# Patient Record
Sex: Female | Born: 1940 | Race: Asian | Hispanic: No | Smoking: Never smoker
Health system: Southern US, Community
[De-identification: ages and names within clinical notes are randomized; demographics above are authoritative.]

## PROBLEM LIST (undated history)

## (undated) DIAGNOSIS — M7710 Lateral epicondylitis, unspecified elbow: Secondary | ICD-10-CM

## (undated) DIAGNOSIS — E785 Hyperlipidemia, unspecified: Secondary | ICD-10-CM

## (undated) DIAGNOSIS — K219 Gastro-esophageal reflux disease without esophagitis: Secondary | ICD-10-CM

## (undated) DIAGNOSIS — I10 Essential (primary) hypertension: Secondary | ICD-10-CM

## (undated) DIAGNOSIS — F3289 Other specified depressive episodes: Secondary | ICD-10-CM

## (undated) DIAGNOSIS — F329 Major depressive disorder, single episode, unspecified: Secondary | ICD-10-CM

## (undated) DIAGNOSIS — K7689 Other specified diseases of liver: Secondary | ICD-10-CM

## (undated) DIAGNOSIS — K3184 Gastroparesis: Secondary | ICD-10-CM

## (undated) DIAGNOSIS — F411 Generalized anxiety disorder: Secondary | ICD-10-CM

## (undated) HISTORY — DX: Lateral epicondylitis, unspecified elbow: M77.10

## (undated) HISTORY — PX: CATARACT EXTRACTION, BILATERAL: SHX1313

## (undated) HISTORY — DX: Other specified diseases of liver: K76.89

## (undated) HISTORY — DX: Major depressive disorder, single episode, unspecified: F32.9

## (undated) HISTORY — DX: Essential (primary) hypertension: I10

## (undated) HISTORY — DX: Gastroparesis: K31.84

## (undated) HISTORY — DX: Gastro-esophageal reflux disease without esophagitis: K21.9

## (undated) HISTORY — DX: Generalized anxiety disorder: F41.1

## (undated) HISTORY — DX: Other specified depressive episodes: F32.89

## (undated) HISTORY — DX: Hyperlipidemia, unspecified: E78.5

---

## 2004-07-17 ENCOUNTER — Ambulatory Visit: Payer: Self-pay | Admitting: Internal Medicine

## 2004-07-18 ENCOUNTER — Ambulatory Visit: Payer: Self-pay | Admitting: Internal Medicine

## 2004-07-29 ENCOUNTER — Ambulatory Visit: Payer: Self-pay | Admitting: Internal Medicine

## 2004-08-04 ENCOUNTER — Ambulatory Visit: Payer: Self-pay | Admitting: Internal Medicine

## 2004-08-14 ENCOUNTER — Emergency Department (HOSPITAL_COMMUNITY): Admission: EM | Admit: 2004-08-14 | Discharge: 2004-08-14 | Payer: Self-pay | Admitting: *Deleted

## 2004-09-01 ENCOUNTER — Encounter: Admission: RE | Admit: 2004-09-01 | Discharge: 2004-11-30 | Payer: Self-pay | Admitting: Internal Medicine

## 2004-10-20 ENCOUNTER — Ambulatory Visit: Payer: Self-pay | Admitting: Internal Medicine

## 2004-10-27 ENCOUNTER — Ambulatory Visit: Payer: Self-pay | Admitting: Internal Medicine

## 2006-08-19 ENCOUNTER — Ambulatory Visit: Payer: Self-pay | Admitting: Internal Medicine

## 2006-08-19 LAB — CONVERTED CEMR LAB
AST: 32 units/L (ref 0–37)
Albumin: 3.9 g/dL (ref 3.5–5.2)
BUN: 13 mg/dL (ref 6–23)
Basophils Relative: 1.3 % — ABNORMAL HIGH (ref 0.0–1.0)
Bilirubin, Direct: 0.2 mg/dL (ref 0.0–0.3)
CO2: 30 meq/L (ref 19–32)
Eosinophils Absolute: 0 10*3/uL (ref 0.0–0.6)
Eosinophils Relative: 0.7 % (ref 0.0–5.0)
GFR calc Af Amer: 93 mL/min
GFR calc non Af Amer: 77 mL/min
HDL: 44.2 mg/dL (ref >39.0)
Hemoglobin, Urine: NEGATIVE
Hemoglobin: 10.5 g/dL — ABNORMAL LOW (ref 12.0–15.0)
Ketones, ur: NEGATIVE mg/dL
MCHC: 34.3 g/dL (ref 30.0–36.0)
MCV: 85 fL (ref 78.0–100.0)
Monocytes Relative: 5.4 % (ref 3.0–11.0)
Neutro Abs: 3.1 10*3/uL (ref 1.4–7.7)
Neutrophils Relative %: 60.4 % (ref 43.0–77.0)
Nitrite: NEGATIVE
Potassium: 4.6 meq/L (ref 3.5–5.1)
Triglycerides: 266 mg/dL (ref 0–149)
Urine Glucose: NEGATIVE mg/dL

## 2006-08-24 ENCOUNTER — Ambulatory Visit: Payer: Self-pay | Admitting: Internal Medicine

## 2006-10-11 ENCOUNTER — Encounter: Payer: Self-pay | Admitting: Internal Medicine

## 2006-10-11 LAB — CONVERTED CEMR LAB

## 2006-11-12 ENCOUNTER — Emergency Department (HOSPITAL_COMMUNITY): Admission: EM | Admit: 2006-11-12 | Discharge: 2006-11-12 | Payer: Self-pay | Admitting: Emergency Medicine

## 2006-11-16 ENCOUNTER — Ambulatory Visit: Payer: Self-pay | Admitting: Internal Medicine

## 2006-11-19 ENCOUNTER — Ambulatory Visit (HOSPITAL_COMMUNITY): Admission: RE | Admit: 2006-11-19 | Discharge: 2006-11-19 | Payer: Self-pay | Admitting: Internal Medicine

## 2006-12-21 ENCOUNTER — Ambulatory Visit: Payer: Self-pay | Admitting: Psychiatry

## 2006-12-31 ENCOUNTER — Ambulatory Visit: Payer: Self-pay | Admitting: Internal Medicine

## 2007-02-07 ENCOUNTER — Ambulatory Visit: Payer: Self-pay | Admitting: Internal Medicine

## 2007-02-07 LAB — CONVERTED CEMR LAB
ALT: 34 units/L (ref 0–35)
AST: 25 units/L (ref 0–37)
BUN: 12 mg/dL (ref 6–23)
CO2: 30 meq/L (ref 19–32)
Calcium: 9.5 mg/dL (ref 8.4–10.5)
Chloride: 108 meq/L (ref 96–112)
Creatinine, Ser: 0.8 mg/dL (ref 0.4–1.2)
Creatinine,U: 59 mg/dL
Hgb A1c MFr Bld: 6.5 % — ABNORMAL HIGH (ref 4.6–6.0)
Microalb Creat Ratio: 10.2 mg/g (ref 0.0–30.0)
Microalb, Ur: 0.6 mg/dL (ref 0.0–1.9)
Potassium: 4 meq/L (ref 3.5–5.1)
TSH: 0.68 microintl units/mL (ref 0.35–5.50)

## 2007-02-11 ENCOUNTER — Ambulatory Visit: Payer: Self-pay | Admitting: Internal Medicine

## 2007-02-22 ENCOUNTER — Ambulatory Visit: Payer: Self-pay | Admitting: Internal Medicine

## 2007-02-28 ENCOUNTER — Ambulatory Visit: Payer: Self-pay | Admitting: Internal Medicine

## 2007-03-09 ENCOUNTER — Inpatient Hospital Stay (HOSPITAL_COMMUNITY): Admission: EM | Admit: 2007-03-09 | Discharge: 2007-03-11 | Payer: Self-pay | Admitting: Emergency Medicine

## 2007-03-09 ENCOUNTER — Ambulatory Visit: Payer: Self-pay | Admitting: Internal Medicine

## 2007-03-16 ENCOUNTER — Ambulatory Visit: Payer: Self-pay | Admitting: Internal Medicine

## 2007-03-21 ENCOUNTER — Ambulatory Visit: Payer: Self-pay | Admitting: Internal Medicine

## 2007-03-28 ENCOUNTER — Encounter: Admission: RE | Admit: 2007-03-28 | Discharge: 2007-04-13 | Payer: Self-pay | Admitting: Internal Medicine

## 2007-04-14 ENCOUNTER — Ambulatory Visit: Payer: Self-pay | Admitting: Internal Medicine

## 2007-05-09 ENCOUNTER — Ambulatory Visit: Payer: Self-pay | Admitting: Internal Medicine

## 2007-05-09 DIAGNOSIS — I10 Essential (primary) hypertension: Secondary | ICD-10-CM | POA: Insufficient documentation

## 2007-05-09 DIAGNOSIS — K219 Gastro-esophageal reflux disease without esophagitis: Secondary | ICD-10-CM | POA: Insufficient documentation

## 2007-05-09 DIAGNOSIS — F32A Depression, unspecified: Secondary | ICD-10-CM | POA: Insufficient documentation

## 2007-05-09 DIAGNOSIS — IMO0001 Reserved for inherently not codable concepts without codable children: Secondary | ICD-10-CM | POA: Insufficient documentation

## 2007-05-09 DIAGNOSIS — F329 Major depressive disorder, single episode, unspecified: Secondary | ICD-10-CM | POA: Insufficient documentation

## 2007-05-09 DIAGNOSIS — E119 Type 2 diabetes mellitus without complications: Secondary | ICD-10-CM | POA: Insufficient documentation

## 2007-05-09 HISTORY — DX: Essential (primary) hypertension: I10

## 2007-05-12 ENCOUNTER — Emergency Department (HOSPITAL_COMMUNITY): Admission: EM | Admit: 2007-05-12 | Discharge: 2007-05-12 | Payer: Self-pay | Admitting: Emergency Medicine

## 2007-05-26 ENCOUNTER — Encounter: Payer: Self-pay | Admitting: Internal Medicine

## 2007-07-05 ENCOUNTER — Telehealth: Payer: Self-pay | Admitting: Internal Medicine

## 2007-07-05 ENCOUNTER — Ambulatory Visit: Payer: Self-pay | Admitting: Internal Medicine

## 2007-07-05 DIAGNOSIS — E785 Hyperlipidemia, unspecified: Secondary | ICD-10-CM | POA: Insufficient documentation

## 2007-07-05 DIAGNOSIS — R5381 Other malaise: Secondary | ICD-10-CM

## 2007-07-05 DIAGNOSIS — R5383 Other fatigue: Secondary | ICD-10-CM

## 2007-07-05 LAB — CONVERTED CEMR LAB
ALT: 55 units/L — ABNORMAL HIGH (ref 0–35)
AST: 32 units/L (ref 0–37)
Albumin: 4.4 g/dL (ref 3.5–5.2)
Alkaline Phosphatase: 68 units/L (ref 39–117)
BUN: 11 mg/dL (ref 6–23)
Basophils Absolute: 0 10*3/uL (ref 0.0–0.1)
Bilirubin, Direct: 0.2 mg/dL (ref 0.0–0.3)
CO2: 30 meq/L (ref 19–32)
Calcium: 9.6 mg/dL (ref 8.4–10.5)
Cholesterol: 259 mg/dL (ref 0–200)
Creatinine, Ser: 0.8 mg/dL (ref 0.4–1.2)
GFR calc Af Amer: 93 mL/min
HCT: 38.1 % (ref 36.0–46.0)
Hemoglobin: 13.2 g/dL (ref 12.0–15.0)
Hgb A1c MFr Bld: 7.3 % — ABNORMAL HIGH (ref 4.6–6.0)
Lymphocytes Relative: 27.4 % (ref 12.0–46.0)
MCHC: 34.6 g/dL (ref 30.0–36.0)
Monocytes Relative: 5.9 % (ref 3.0–11.0)
Potassium: 4.1 meq/L (ref 3.5–5.1)
RBC: 4.47 M/uL (ref 3.87–5.11)

## 2007-07-19 ENCOUNTER — Ambulatory Visit: Payer: Self-pay | Admitting: Internal Medicine

## 2007-07-19 DIAGNOSIS — R945 Abnormal results of liver function studies: Secondary | ICD-10-CM

## 2007-08-15 ENCOUNTER — Ambulatory Visit: Payer: Self-pay | Admitting: Internal Medicine

## 2007-08-15 LAB — CONVERTED CEMR LAB
Hemoglobin, Urine: NEGATIVE
Ketones, ur: NEGATIVE mg/dL
Leukocytes, UA: NEGATIVE
Urine Glucose: 250 mg/dL — AB
Urobilinogen, UA: 0.2 (ref 0.0–1.0)

## 2007-09-01 DIAGNOSIS — R42 Dizziness and giddiness: Secondary | ICD-10-CM

## 2007-09-01 DIAGNOSIS — K7689 Other specified diseases of liver: Secondary | ICD-10-CM | POA: Insufficient documentation

## 2007-09-01 DIAGNOSIS — K3184 Gastroparesis: Secondary | ICD-10-CM | POA: Insufficient documentation

## 2007-09-05 ENCOUNTER — Ambulatory Visit: Payer: Self-pay | Admitting: Internal Medicine

## 2007-10-21 ENCOUNTER — Encounter: Payer: Self-pay | Admitting: Internal Medicine

## 2007-10-25 ENCOUNTER — Ambulatory Visit: Payer: Self-pay | Admitting: Internal Medicine

## 2007-10-25 LAB — CONVERTED CEMR LAB
Alkaline Phosphatase: 52 units/L (ref 39–117)
Bilirubin Urine: NEGATIVE
Bilirubin, Direct: 0.2 mg/dL (ref 0.0–0.3)
Calcium: 9 mg/dL (ref 8.4–10.5)
Cholesterol: 175 mg/dL (ref 0–200)
Creatinine, Ser: 0.8 mg/dL (ref 0.4–1.2)
GFR calc Af Amer: 93 mL/min
GFR calc non Af Amer: 77 mL/min
HDL: 53.4 mg/dL (ref >39.0)
Hgb A1c MFr Bld: 7.5 % — ABNORMAL HIGH (ref 4.6–6.0)
Leukocytes, UA: NEGATIVE
Microalb Creat Ratio: 32.3 mg/g — ABNORMAL HIGH (ref 0.0–30.0)
Microalb, Ur: 1.5 mg/dL (ref 0.0–1.9)
Specific Gravity, Urine: 1.01 (ref 1.000–1.03)
TSH: 1.34 microintl units/mL (ref 0.35–5.50)
Total Bilirubin: 1.6 mg/dL — ABNORMAL HIGH (ref 0.3–1.2)
Total CHOL/HDL Ratio: 3.3
Total Protein, Urine: NEGATIVE mg/dL
Total Protein: 7.1 g/dL (ref 6.0–8.3)
Urobilinogen, UA: 0.2 (ref 0.0–1.0)

## 2007-11-09 ENCOUNTER — Encounter: Payer: Self-pay | Admitting: Internal Medicine

## 2007-11-10 ENCOUNTER — Ambulatory Visit: Payer: Self-pay | Admitting: Internal Medicine

## 2008-01-17 ENCOUNTER — Encounter: Admission: RE | Admit: 2008-01-17 | Discharge: 2008-01-17 | Payer: Self-pay | Admitting: Internal Medicine

## 2008-02-06 ENCOUNTER — Encounter: Payer: Self-pay | Admitting: Internal Medicine

## 2008-02-08 ENCOUNTER — Telehealth: Payer: Self-pay | Admitting: Internal Medicine

## 2008-02-09 ENCOUNTER — Encounter: Admission: RE | Admit: 2008-02-09 | Discharge: 2008-02-09 | Payer: Self-pay | Admitting: Internal Medicine

## 2008-03-13 ENCOUNTER — Ambulatory Visit: Payer: Self-pay | Admitting: Internal Medicine

## 2008-03-13 LAB — CONVERTED CEMR LAB
BUN: 8 mg/dL (ref 6–23)
CO2: 26 meq/L (ref 19–32)
GFR calc non Af Amer: 76 mL/min

## 2008-03-26 ENCOUNTER — Ambulatory Visit: Payer: Self-pay | Admitting: Internal Medicine

## 2008-04-19 ENCOUNTER — Encounter: Payer: Self-pay | Admitting: Internal Medicine

## 2008-05-21 ENCOUNTER — Encounter: Payer: Self-pay | Admitting: Internal Medicine

## 2008-06-11 ENCOUNTER — Telehealth (INDEPENDENT_AMBULATORY_CARE_PROVIDER_SITE_OTHER): Payer: Self-pay | Admitting: *Deleted

## 2008-06-20 ENCOUNTER — Ambulatory Visit: Payer: Self-pay | Admitting: Internal Medicine

## 2008-06-20 DIAGNOSIS — F411 Generalized anxiety disorder: Secondary | ICD-10-CM | POA: Insufficient documentation

## 2008-08-20 ENCOUNTER — Encounter: Payer: Self-pay | Admitting: Internal Medicine

## 2008-11-28 ENCOUNTER — Encounter: Payer: Self-pay | Admitting: Internal Medicine

## 2008-12-03 ENCOUNTER — Encounter: Payer: Self-pay | Admitting: Internal Medicine

## 2008-12-17 ENCOUNTER — Ambulatory Visit: Payer: Self-pay | Admitting: Internal Medicine

## 2008-12-17 DIAGNOSIS — M7710 Lateral epicondylitis, unspecified elbow: Secondary | ICD-10-CM | POA: Insufficient documentation

## 2008-12-20 ENCOUNTER — Encounter: Payer: Self-pay | Admitting: Internal Medicine

## 2008-12-20 LAB — CONVERTED CEMR LAB
ALT: 16 units/L (ref 0–35)
BUN: 15 mg/dL (ref 6–23)
Basophils Relative: 0.4 % (ref 0.0–3.0)
CO2: 30 meq/L (ref 19–32)
Calcium: 9.3 mg/dL (ref 8.4–10.5)
Cholesterol: 140 mg/dL (ref 0–200)
Creatinine, Ser: 0.9 mg/dL (ref 0.4–1.2)
GFR calc non Af Amer: 66.37 mL/min (ref >60)
Glucose, Bld: 129 mg/dL — ABNORMAL HIGH (ref 70–99)
Hemoglobin: 10.6 g/dL — ABNORMAL LOW (ref 12.0–15.0)
Hgb A1c MFr Bld: 6.7 % — ABNORMAL HIGH (ref 4.6–6.5)
Ketones, ur: NEGATIVE mg/dL
Leukocytes, UA: NEGATIVE
Lymphocytes Relative: 36.5 % (ref 12.0–46.0)
Lymphs Abs: 2.3 10*3/uL (ref 0.7–4.0)
MCHC: 32.6 g/dL (ref 30.0–36.0)
Monocytes Absolute: 0.4 10*3/uL (ref 0.1–1.0)
Monocytes Relative: 6.2 % (ref 3.0–12.0)
Neutro Abs: 3.5 10*3/uL (ref 1.4–7.7)
Nitrite: NEGATIVE
Total Bilirubin: 1.3 mg/dL — ABNORMAL HIGH (ref 0.3–1.2)
Total Protein, Urine: NEGATIVE mg/dL
Total Protein: 7.8 g/dL (ref 6.0–8.3)
Triglycerides: 177 mg/dL — ABNORMAL HIGH (ref 0.0–149.0)
Urine Glucose: NEGATIVE mg/dL
Urobilinogen, UA: 0.2 (ref 0.0–1.0)
WBC: 6.3 10*3/uL (ref 4.5–10.5)
pH: 6 (ref 5.0–8.0)

## 2008-12-26 ENCOUNTER — Telehealth: Payer: Self-pay | Admitting: Internal Medicine

## 2008-12-27 ENCOUNTER — Ambulatory Visit: Payer: Self-pay | Admitting: Internal Medicine

## 2008-12-27 LAB — CONVERTED CEMR LAB: Saturation Ratios: 17.4 % — ABNORMAL LOW (ref 20.0–50.0)

## 2008-12-28 ENCOUNTER — Encounter: Admission: RE | Admit: 2008-12-28 | Discharge: 2008-12-28 | Payer: Self-pay | Admitting: Internal Medicine

## 2008-12-31 ENCOUNTER — Telehealth: Payer: Self-pay | Admitting: Internal Medicine

## 2009-01-28 ENCOUNTER — Telehealth: Payer: Self-pay | Admitting: Internal Medicine

## 2009-01-30 ENCOUNTER — Telehealth: Payer: Self-pay | Admitting: Internal Medicine

## 2009-06-04 ENCOUNTER — Ambulatory Visit: Payer: Self-pay | Admitting: Internal Medicine

## 2009-06-04 LAB — CONVERTED CEMR LAB
Calcium: 9.3 mg/dL (ref 8.4–10.5)
Cholesterol: 128 mg/dL (ref 0–200)
GFR calc non Af Amer: 75.92 mL/min (ref >60)
LDL Cholesterol: 44 mg/dL (ref 0–99)
Total CHOL/HDL Ratio: 2
Triglycerides: 103 mg/dL (ref 0.0–149.0)

## 2009-06-10 ENCOUNTER — Ambulatory Visit: Payer: Self-pay | Admitting: Internal Medicine

## 2009-06-28 ENCOUNTER — Telehealth: Payer: Self-pay | Admitting: Internal Medicine

## 2009-07-15 ENCOUNTER — Encounter: Admission: RE | Admit: 2009-07-15 | Discharge: 2009-07-15 | Payer: Self-pay | Admitting: Internal Medicine

## 2009-08-12 ENCOUNTER — Encounter: Payer: Self-pay | Admitting: Internal Medicine

## 2009-08-15 ENCOUNTER — Telehealth (INDEPENDENT_AMBULATORY_CARE_PROVIDER_SITE_OTHER): Payer: Self-pay | Admitting: *Deleted

## 2009-08-23 ENCOUNTER — Telehealth: Payer: Self-pay | Admitting: Internal Medicine

## 2009-09-03 ENCOUNTER — Encounter: Payer: Self-pay | Admitting: Internal Medicine

## 2009-12-04 LAB — CONVERTED CEMR LAB
ALT: 16 units/L (ref 0–35)
Alkaline Phosphatase: 50 units/L (ref 39–117)
BUN: 12 mg/dL (ref 6–23)
Basophils Absolute: 0 10*3/uL (ref 0.0–0.1)
Basophils Relative: 0.5 % (ref 0.0–3.0)
Bilirubin Urine: NEGATIVE
Calcium: 9.4 mg/dL (ref 8.4–10.5)
Cholesterol: 120 mg/dL (ref 0–200)
Creatinine, Ser: 0.7 mg/dL (ref 0.4–1.2)
Creatinine,U: 127.8 mg/dL
Eosinophils Relative: 0.7 % (ref 0.0–5.0)
Glucose, Bld: 143 mg/dL — ABNORMAL HIGH (ref 70–99)
Hemoglobin: 12.1 g/dL (ref 12.0–15.0)
Ketones, ur: NEGATIVE mg/dL
LDL Cholesterol: 37 mg/dL (ref 0–99)
Lymphocytes Relative: 39 % (ref 12.0–46.0)
Microalb, Ur: 2.4 mg/dL — ABNORMAL HIGH (ref 0.0–1.9)
Neutrophils Relative %: 54.5 % (ref 43.0–77.0)
Nitrite: POSITIVE
Platelets: 186 10*3/uL (ref 150.0–400.0)
Potassium: 4.6 meq/L (ref 3.5–5.1)
RBC: 4.09 M/uL (ref 3.87–5.11)
Total Bilirubin: 1.8 mg/dL — ABNORMAL HIGH (ref 0.3–1.2)
Total Protein, Urine: NEGATIVE mg/dL
Total Protein: 7 g/dL (ref 6.0–8.3)
Triglycerides: 124 mg/dL (ref 0.0–149.0)
VLDL: 24.8 mg/dL (ref 0.0–40.0)
pH: 5.5 (ref 5.0–8.0)

## 2009-12-09 ENCOUNTER — Ambulatory Visit: Payer: Self-pay | Admitting: Internal Medicine

## 2009-12-10 ENCOUNTER — Ambulatory Visit: Payer: Self-pay | Admitting: Internal Medicine

## 2009-12-26 ENCOUNTER — Telehealth: Payer: Self-pay | Admitting: Internal Medicine

## 2010-01-03 ENCOUNTER — Encounter: Admission: RE | Admit: 2010-01-03 | Discharge: 2010-01-03 | Payer: Self-pay | Admitting: Internal Medicine

## 2010-01-03 LAB — HM MAMMOGRAPHY: HM Mammogram: NEGATIVE

## 2010-01-23 ENCOUNTER — Emergency Department (HOSPITAL_COMMUNITY): Admission: EM | Admit: 2010-01-23 | Discharge: 2010-01-23 | Payer: Self-pay | Admitting: Emergency Medicine

## 2010-06-02 ENCOUNTER — Ambulatory Visit: Payer: Self-pay | Admitting: Internal Medicine

## 2010-06-03 LAB — CONVERTED CEMR LAB
BUN: 17 mg/dL (ref 6–23)
CO2: 29 meq/L (ref 19–32)
Calcium: 9.2 mg/dL (ref 8.4–10.5)
Chloride: 105 meq/L (ref 96–112)
Creatinine, Ser: 1 mg/dL (ref 0.4–1.2)
GFR calc non Af Amer: 62.08 mL/min (ref >60.00)
Glucose, Bld: 120 mg/dL — ABNORMAL HIGH (ref 70–99)
HDL: 69.6 mg/dL (ref >39.00)
VLDL: 17.2 mg/dL (ref 0.0–40.0)

## 2010-06-09 ENCOUNTER — Ambulatory Visit: Payer: Self-pay | Admitting: Internal Medicine

## 2010-06-11 ENCOUNTER — Telehealth: Payer: Self-pay | Admitting: Internal Medicine

## 2010-06-13 ENCOUNTER — Encounter (INDEPENDENT_AMBULATORY_CARE_PROVIDER_SITE_OTHER): Payer: Self-pay | Admitting: *Deleted

## 2010-07-10 ENCOUNTER — Telehealth: Payer: Self-pay | Admitting: Internal Medicine

## 2010-07-24 NOTE — Assessment & Plan Note (Signed)
Summary: 6 MO ROV /NWS  #   Vital Signs:  Patient profile:   70 year old female Height:      59 inches Weight:      153.13 pounds BMI:     31.04 O2 Sat:      97 % on Room air Temp:     99.2 degrees F oral Pulse rate:   87 / minute BP sitting:   122 / 68  (left arm) Cuff size:   regular  Vitals Entered By: Zella Ball Ewing CMA Duncan Dull) (June 09, 2010 3:11 PM)  O2 Flow:  Room air CC: 6 month ROV/RE   CC:  6 month ROV/RE.  History of Present Illness: here to f/u; overall doing ok;  had some recent episode of vertigo yesterday without headaceh, fever, sinus symptoms, earache, hearing loss or n/v; has recurrent symptoms and requests the meclizine as this has helped in the past;  /o/w doing ok;  Pt denies CP, worsening sob, doe, wheezing, orthopnea, pnd, worsening LE edema, palps, dizziness or syncope  Pt denies other new neuro symptoms such as headache, facial or extremity weakness  Pt denies polydipsia, polyuria, or low sugar symptoms such as shakiness improved with eating.  Overall good compliance with meds, trying to follow low chol, DM diet, wt stable, little excercise however  CBG's in the 100's.    Problems Prior to Update: 1)  Lateral Epicondylitis, Right  (ICD-726.32) 2)  Preventive Health Care  (ICD-V70.0) 3)  Anxiety  (ICD-300.00) 4)  Vertigo, Chronic  (ICD-780.4) 5)  Fatty Liver Disease  (ICD-571.8) 6)  Gastroparesis  (ICD-536.3) 7)  Liver Function Tests, Abnormal  (ICD-794.8) 8)  Other Malaise and Fatigue  (ICD-780.79) 9)  Hyperlipidemia  (ICD-272.4) 10)  Depression  (ICD-311) 11)  Hypertension  (ICD-401.9) 12)  Diabetes Mellitus, Type II  (ICD-250.00) 13)  Gerd  (ICD-530.81) 14)  Myalgia  (ICD-729.1)  Medications Prior to Update: 1)  Atacand 32 Mg  Tabs (Candesartan Cilexetil) .... Take 1 Tablet By Mouth Once A Day 2)  Symbyax 6-50 Mg Caps (Olanzapine-Fluoxetine Hcl) .Marland Kitchen.. 1po Once Daily 3)  Crestor 20 Mg Tabs (Rosuvastatin Calcium) .Marland Kitchen.. 1 By Mouth Once Daily 4)   Glucophage Xr 500 Mg  Tb24 (Metformin Hcl) .... One By Mouth Two Times A Day 5)  Actos 45 Mg Tabs (Pioglitazone Hcl) .Marland Kitchen.. 1 By Mouth Once Daily 6)  Adult Aspirin Ec Low Strength 81 Mg Tbec (Aspirin) .Marland Kitchen.. 1 By Mouth Once Daily 7)  Clonazepam 0.5 Mg Tabs (Clonazepam) .Marland Kitchen.. 1 By Mouth Two Times A Day - To Fill December 26, 2009 8)  Amlodipine Besylate 10 Mg Tabs (Amlodipine Besylate) .Marland Kitchen.. 1 By Mouth Once Daily 9)  Hydrocodone-Acetaminophen 5-325 Mg Tabs (Hydrocodone-Acetaminophen) .Marland Kitchen.. 1po Q 6 Hrs As Needed Pain 10)  Aspir-Low 81 Mg Tbec (Aspirin) .Marland Kitchen.. 1po Once Daily 11)  Diovan 320 Mg Tabs (Valsartan) .Marland Kitchen.. 1 By Mouth Once Daily  Current Medications (verified): 1)  Atacand 32 Mg  Tabs (Candesartan Cilexetil) .... Take 1 Tablet By Mouth Once A Day 2)  Symbyax 6-50 Mg Caps (Olanzapine-Fluoxetine Hcl) .Marland Kitchen.. 1po Once Daily 3)  Crestor 10 Mg Tabs (Rosuvastatin Calcium) .Marland Kitchen.. 1po Once Daily 4)  Glucophage Xr 500 Mg  Tb24 (Metformin Hcl) .... One By Mouth Two Times A Day 5)  Actos 45 Mg Tabs (Pioglitazone Hcl) .Marland Kitchen.. 1 By Mouth Once Daily 6)  Adult Aspirin Ec Low Strength 81 Mg Tbec (Aspirin) .Marland Kitchen.. 1 By Mouth Once Daily 7)  Clonazepam 0.5 Mg Tabs (Clonazepam) .Marland KitchenMarland KitchenMarland Kitchen  1 By Mouth Two Times A Day - To Fill December 26, 2009 8)  Amlodipine Besylate 10 Mg Tabs (Amlodipine Besylate) .Marland Kitchen.. 1 By Mouth Once Daily 9)  Hydrocodone-Acetaminophen 5-325 Mg Tabs (Hydrocodone-Acetaminophen) .Marland Kitchen.. 1po Q 6 Hrs As Needed Pain 10)  Aspir-Low 81 Mg Tbec (Aspirin) .Marland Kitchen.. 1po Once Daily 11)  Diovan 320 Mg Tabs (Valsartan) .Marland Kitchen.. 1 By Mouth Once Daily 12)  Fish Oil 1000 Mg Caps (Omega-3 Fatty Acids) .Marland Kitchen.. 1 By Mouth Two Times A Day 13)  Meclizine Hcl 12.5 Mg Tabs (Meclizine Hcl) .Marland Kitchen.. 1-2 By Mouth Q 6 Hrs As Needed Dizziness  Allergies (verified): No Known Drug Allergies  Past History:  Past Medical History: Last updated: 06/20/2008 Diabetes mellitus, type II Hypertension GERD Depression Gastroparesis IBS History of Vertigo/chronic  dizziness Hyperlipidemia gastroparesis Anxiety  Past Surgical History: Last updated: 03/26/2008 Cataract extraction - bilat  Social History: Last updated: 07/19/2007 Divorced Has 6 children Never Smoked Alcohol use-no Drug use-no  Risk Factors: Smoking Status: never (07/19/2007)  Review of Systems       all otherwise negative per pt -    Physical Exam  General:  alert and overweight-appearing.   Head:  normocephalic and atraumatic.   Eyes:  vision grossly intact, pupils equal, and pupils round.   Ears:  R ear normal and L ear normal.   Nose:  no external deformity and no nasal discharge.   Mouth:  no gingival abnormalities and pharynx pink and moist.   Neck:  supple and no masses.   Lungs:  normal respiratory effort and normal breath sounds.   Heart:  normal rate and regular rhythm.   Extremities:  no edema, no erythema  Neurologic:  cranial nerves II-XII intact and strength normal in all extremities.  sensation intact to light touch, gait normal, and finger-to-nose normal.     Impression & Recommendations:  Problem # 1:  VERTIGO, CHRONIC (ICD-780.4)  to re-start the meclizine as needed , exam benign  Her updated medication list for this problem includes:    Meclizine Hcl 12.5 Mg Tabs (Meclizine hcl) .Marland Kitchen... 1-2 by mouth q 6 hrs as needed dizziness  Problem # 2:  HYPERTENSION (ICD-401.9)  Her updated medication list for this problem includes:    Atacand 32 Mg Tabs (Candesartan cilexetil) .Marland Kitchen... Take 1 tablet by mouth once a day    Amlodipine Besylate 10 Mg Tabs (Amlodipine besylate) .Marland Kitchen... 1 by mouth once daily    Diovan 320 Mg Tabs (Valsartan) .Marland Kitchen... 1 by mouth once daily  BP today: 122/68 Prior BP: 122/70 (12/10/2009)  Labs Reviewed: K+: 4.1 (06/03/2010) Creat: : 1.0 (06/03/2010)   Chol: 126 (06/03/2010)   HDL: 69.60 (06/03/2010)   LDL: 39 (06/03/2010)   TG: 86.0 (06/03/2010) stable overall by hx and exam, ok to continue meds/tx as is   Problem # 3:   HYPERLIPIDEMIA (ICD-272.4)  Her updated medication list for this problem includes:    Crestor 10 Mg Tabs (Rosuvastatin calcium) .Marland Kitchen... 1po once daily overcontrolled - to reduce to 10 mg per day  Labs Reviewed: SGOT: 23 (12/04/2009)   SGPT: 16 (12/04/2009)   HDL:69.60 (06/03/2010), 57.80 (12/04/2009)  LDL:39 (06/03/2010), 37 (12/04/2009)  Chol:126 (06/03/2010), 120 (12/04/2009)  Trig:86.0 (06/03/2010), 124.0 (12/04/2009)  Problem # 4:  DIABETES MELLITUS, TYPE II (ICD-250.00)  Her updated medication list for this problem includes:    Atacand 32 Mg Tabs (Candesartan cilexetil) .Marland Kitchen... Take 1 tablet by mouth once a day    Glucophage Xr 500 Mg Tb24 (Metformin hcl) .Marland KitchenMarland KitchenMarland KitchenMarland Kitchen  One by mouth two times a day    Actos 45 Mg Tabs (Pioglitazone hcl) .Marland Kitchen... 1 by mouth once daily    Adult Aspirin Ec Low Strength 81 Mg Tbec (Aspirin) .Marland Kitchen... 1 by mouth once daily    Aspir-low 81 Mg Tbec (Aspirin) .Marland Kitchen... 1po once daily    Diovan 320 Mg Tabs (Valsartan) .Marland Kitchen... 1 by mouth once daily  Labs Reviewed: Creat: 1.0 (06/03/2010)    Reviewed HgBA1c results: 6.6 (06/03/2010)  6.8 (12/04/2009) stable overall by hx and exam, ok to continue meds/tx as is   Complete Medication List: 1)  Atacand 32 Mg Tabs (Candesartan cilexetil) .... Take 1 tablet by mouth once a day 2)  Symbyax 6-50 Mg Caps (Olanzapine-fluoxetine hcl) .Marland Kitchen.. 1po once daily 3)  Crestor 10 Mg Tabs (Rosuvastatin calcium) .Marland Kitchen.. 1po once daily 4)  Glucophage Xr 500 Mg Tb24 (Metformin hcl) .... One by mouth two times a day 5)  Actos 45 Mg Tabs (Pioglitazone hcl) .Marland Kitchen.. 1 by mouth once daily 6)  Adult Aspirin Ec Low Strength 81 Mg Tbec (Aspirin) .Marland Kitchen.. 1 by mouth once daily 7)  Clonazepam 0.5 Mg Tabs (Clonazepam) .Marland Kitchen.. 1 by mouth two times a day - to fill December 26, 2009 8)  Amlodipine Besylate 10 Mg Tabs (Amlodipine besylate) .Marland Kitchen.. 1 by mouth once daily 9)  Hydrocodone-acetaminophen 5-325 Mg Tabs (Hydrocodone-acetaminophen) .Marland Kitchen.. 1po q 6 hrs as needed pain 10)  Aspir-low 81  Mg Tbec (Aspirin) .Marland Kitchen.. 1po once daily 11)  Diovan 320 Mg Tabs (Valsartan) .Marland Kitchen.. 1 by mouth once daily 12)  Fish Oil 1000 Mg Caps (Omega-3 fatty acids) .Marland Kitchen.. 1 by mouth two times a day 13)  Meclizine Hcl 12.5 Mg Tabs (Meclizine hcl) .Marland Kitchen.. 1-2 by mouth q 6 hrs as needed dizziness  Patient Instructions: 1)  Please take all new medications as prescribed - the meclizine for the dizziness 2)  decrease the crestor to 10 mg per day (the cholesterol medication) 3)  Continue all previous medications as before this visit  4)  Please schedule a follow-up appointment in 6 months for CPX with labs and: 5)  HbgA1C prior to visit, ICD-9: 250.02 6)  Urine Microalbumin prior to visit, ICD-9: Prescriptions: MECLIZINE HCL 12.5 MG TABS (MECLIZINE HCL) 1-2 by mouth q 6 hrs as needed dizziness  #60 x 1   Entered and Authorized by:   Corwin Levins MD   Signed by:   Corwin Levins MD on 06/09/2010   Method used:   Print then Give to Patient   RxID:   [id number] CRESTOR 10 MG TABS (ROSUVASTATIN CALCIUM) 1po once daily  #90 x 3   Entered and Authorized by:   Corwin Levins MD   Signed by:   Corwin Levins MD on 06/09/2010   Method used:   Print then Give to Patient   RxID:   [id number]    Orders Added: 1)  Est. Patient Level IV [84696]

## 2010-07-24 NOTE — Progress Notes (Signed)
----   Converted from flag ---- ---- 06/11/2010 12:58 PM, Corwin Levins MD wrote: please call pt;  I reviewed her Annamarie Major regarding possible depression and agree she likely has depression;  can be start medication for her such as citalopram 20 mg (might need to know her pharmacy as well) ------------------------------  called pt left msg. to call back called pt. left msg. to call back   Called pt. back and left message. Sent pt a letter informing to call our office at her convenience to inform of JWJ message.

## 2010-07-24 NOTE — Letter (Signed)
Summary: Generic Letter  [facility]  [address] [place], [place] [address]   Phone: [phone]  Fax: [phone]    06/13/2010  Latasha Wang [address] [place], [place]  [address]  Dear Ms. Davis,  I have tried several days to reach you by phone but have been unable. Dr. Jonny Ruiz has reviewed the [facility] form and it is regarding that form. Please call our office at your convenience.           Sincerely,   Robin Ewing CMA (AAMA)

## 2010-07-24 NOTE — Medication Information (Signed)
Summary: [employer]  [employer]   Imported By: Sherian Rein 08/26/2009 09:02:07  _____________________________________________________________________  External Attachment:    Type:   Image     Comment:   External Document

## 2010-07-24 NOTE — Letter (Signed)
Summary: [employer]  [employer]   Imported By: Sherian Rein 09/11/2009 09:59:17  _____________________________________________________________________  External Attachment:    Type:   Image     Comment:   External Document

## 2010-07-24 NOTE — Progress Notes (Signed)
Summary: med refill  Phone Note Refill Request  on June 28, 2009 9:33 AM  Refills Requested: Medication #1:  CLONAZEPAM 0.5 MG TABS 1 by mouth two times a day - to fill june 28   Dosage confirmed as above?Dosage Confirmed   Last Refilled: 12/17/2008   Notes[facility] [place] [place] [id number] Initial call taken by: Scharlene Gloss,  June 28, 2009 9:33 AM  Follow-up for Phone Call        done hardcopy to LIM side B - dahlia  Follow-up by: Corwin Levins MD,  June 28, 2009 11:01 AM  Additional Follow-up for Phone Call Additional follow up Details #1::        Rx faxed to pharmacy Additional Follow-up by: Lucious Groves,  June 28, 2009 11:34 AM    New/Updated Medications: CLONAZEPAM 0.5 MG TABS (CLONAZEPAM) 1 by mouth two times a day - to fill Jun 28, 2009 Prescriptions: CLONAZEPAM 0.5 MG TABS (CLONAZEPAM) 1 by mouth two times a day - to fill Jun 28, 2009  #60 x 5   Entered and Authorized by:   Corwin Levins MD   Signed by:   Corwin Levins MD on 06/28/2009   Method used:   Print then Give to Patient   RxID:   [id number]

## 2010-07-24 NOTE — Assessment & Plan Note (Signed)
Summary: medication/#cd   Vital Signs:  Patient profile:   70 year old female Height:      59 inches Weight:      151.38 pounds BMI:     30.69 O2 Sat:      95 % on Room air Temp:     98.8 degrees F oral Pulse rate:   87 / minute BP sitting:   122 / 70  (left arm) Cuff size:   regular  Vitals Entered By: Zella Ball Ewing CMA (AAMA) (December 10, 2009 3:01 PM)  O2 Flow:  Room air  CC: Fatigue, depression/RE   CC:  Fatigue and depression/RE.  History of Present Illness: here wtih 6 mo persistent and worsening depressive symptoms, wishes she were not around but denies suicidality or plan; all despite good med complinance adn tolerability;  Pt denies CP, sob, doe, wheezing, orthopnea, pnd, worsening LE edema, palps, dizziness or syncope  Pt denies new neuro symptoms such as headache, facial or extremity weakness   Problems Prior to Update: 1)  Lateral Epicondylitis, Right  (ICD-726.32) 2)  Preventive Health Care  (ICD-V70.0) 3)  Anxiety  (ICD-300.00) 4)  Vertigo, Chronic  (ICD-780.4) 5)  Fatty Liver Disease  (ICD-571.8) 6)  Gastroparesis  (ICD-536.3) 7)  Liver Function Tests, Abnormal  (ICD-794.8) 8)  Other Malaise and Fatigue  (ICD-780.79) 9)  Hyperlipidemia  (ICD-272.4) 10)  Depression  (ICD-311) 11)  Hypertension  (ICD-401.9) 12)  Diabetes Mellitus, Type II  (ICD-250.00) 13)  Gerd  (ICD-530.81) 14)  Myalgia  (ICD-729.1)  Medications Prior to Update: 1)  Atacand 32 Mg  Tabs (Candesartan Cilexetil) .... Take 1 Tablet By Mouth Once A Day 2)  Symbyax 6-50 Mg Caps (Olanzapine-Fluoxetine Hcl) .Marland Kitchen.. 1 By Mouth Once Daily 3)  Crestor 20 Mg Tabs (Rosuvastatin Calcium) .Marland Kitchen.. 1 By Mouth Once Daily 4)  Glucophage Xr 500 Mg  Tb24 (Metformin Hcl) .... One By Mouth Two Times A Day 5)  Actos 45 Mg Tabs (Pioglitazone Hcl) .Marland Kitchen.. 1 By Mouth Once Daily 6)  Adult Aspirin Ec Low Strength 81 Mg Tbec (Aspirin) .Marland Kitchen.. 1 By Mouth Once Daily 7)  Clonazepam 0.5 Mg Tabs (Clonazepam) .Marland Kitchen.. 1 By Mouth Two Times A  Day - To Fill Jun 28, 2009 8)  Amlodipine Besylate 10 Mg Tabs (Amlodipine Besylate) .Marland Kitchen.. 1 By Mouth Once Daily 9)  Hydrocodone-Acetaminophen 5-325 Mg Tabs (Hydrocodone-Acetaminophen) .Marland Kitchen.. 1po Q 6 Hrs As Needed Pain 10)  Aspir-Low 81 Mg Tbec (Aspirin) .Marland Kitchen.. 1po Once Daily 11)  Diovan 320 Mg Tabs (Valsartan) .Marland Kitchen.. 1 By Mouth Once Daily  Current Medications (verified): 1)  Atacand 32 Mg  Tabs (Candesartan Cilexetil) .... Take 1 Tablet By Mouth Once A Day 2)  Symbyax 6-50 Mg Caps (Olanzapine-Fluoxetine Hcl) .Marland Kitchen.. 1po Once Daily 3)  Crestor 20 Mg Tabs (Rosuvastatin Calcium) .Marland Kitchen.. 1 By Mouth Once Daily 4)  Glucophage Xr 500 Mg  Tb24 (Metformin Hcl) .... One By Mouth Two Times A Day 5)  Actos 45 Mg Tabs (Pioglitazone Hcl) .Marland Kitchen.. 1 By Mouth Once Daily 6)  Adult Aspirin Ec Low Strength 81 Mg Tbec (Aspirin) .Marland Kitchen.. 1 By Mouth Once Daily 7)  Clonazepam 0.5 Mg Tabs (Clonazepam) .Marland Kitchen.. 1 By Mouth Two Times A Day - To Fill Jun 28, 2009 8)  Amlodipine Besylate 10 Mg Tabs (Amlodipine Besylate) .Marland Kitchen.. 1 By Mouth Once Daily 9)  Hydrocodone-Acetaminophen 5-325 Mg Tabs (Hydrocodone-Acetaminophen) .Marland Kitchen.. 1po Q 6 Hrs As Needed Pain 10)  Aspir-Low 81 Mg Tbec (Aspirin) .Marland Kitchen.. 1po Once Daily 11)  Diovan  320 Mg Tabs (Valsartan) .Marland Kitchen.. 1 By Mouth Once Daily  Allergies (verified): No Known Drug Allergies  Past History:  Past Medical History: Last updated: 06/20/2008 Diabetes mellitus, type II Hypertension GERD Depression Gastroparesis IBS History of Vertigo/chronic dizziness Hyperlipidemia gastroparesis Anxiety  Past Surgical History: Last updated: 03/26/2008 Cataract extraction - bilat  Family History: Last updated: 07/19/2007 Patient does not know her family history  Social History: Last updated: 07/19/2007 Divorced Has 6 children Never Smoked Alcohol use-no Drug use-no  Risk Factors: Smoking Status: never (07/19/2007)  Review of Systems  The patient denies anorexia, fever, weight loss, vision loss,  decreased hearing, hoarseness, chest pain, syncope, dyspnea on exertion, peripheral edema, prolonged cough, headaches, hemoptysis, abdominal pain, melena, hematochezia, severe indigestion/heartburn, hematuria, muscle weakness, suspicious skin lesions, transient blindness, difficulty walking, unusual weight change, abnormal bleeding, enlarged lymph nodes, and angioedema.         all otherwise negative per pt -    Physical Exam  General:  alert and overweight-appearing.   Head:  normocephalic and atraumatic.   Eyes:  vision grossly intact, pupils equal, and pupils round.   Ears:  R ear normal and L ear normal.   Nose:  no external deformity and no nasal discharge.   Mouth:  no gingival abnormalities and pharynx pink and moist.   Neck:  supple and no masses.   Lungs:  normal respiratory effort and normal breath sounds.   Heart:  normal rate and regular rhythm.   Abdomen:  soft, non-tender, and normal bowel sounds.   Msk:  no joint tenderness and no joint swelling.   Extremities:  no edema, no erythema  Neurologic:  cranial nerves II-XII intact and strength normal in all extremities.   Skin:  color normal and no rashes.   Psych:  depressed affect and withdrawn.     Impression & Recommendations:  Problem # 1:  Preventive Health Care (ICD-V70.0) Overall doing well, age appropriate education and counseling updated and referral for appropriate preventive services done unless declined, immunizations up to date or declined, diet counseling done if overweight, urged to quit smoking if smokes , most recent labs reviewed and current ordered if appropriate, ecg reviewed or declined (interpretation per ECG scanned in the EMR if done); information regarding Medicare Prevention requirements given if appropriate; speciality referrals updated as appropriate   Problem # 2:  DEPRESSION (ICD-311)  Her updated medication list for this problem includes:    Clonazepam 0.5 Mg Tabs (Clonazepam) .Marland Kitchen... 1 by mouth  two times a day - to fill Jun 28, 2009 increase the symbyax, refer psychiatry, to ER or [facility] if increased  thoughts of suicide   Orders: Psychiatric Referral (Psych)  Problem # 3:  HYPERTENSION (ICD-401.9)  Her updated medication list for this problem includes:    Atacand 32 Mg Tabs (Candesartan cilexetil) .Marland Kitchen... Take 1 tablet by mouth once a day    Amlodipine Besylate 10 Mg Tabs (Amlodipine besylate) .Marland Kitchen... 1 by mouth once daily    Diovan 320 Mg Tabs (Valsartan) .Marland Kitchen... 1 by mouth once daily  BP today: 122/70 Prior BP: 120/72 (06/10/2009)  Labs Reviewed: K+: 4.6 (12/04/2009) Creat: : 0.7 (12/04/2009)   Chol: 120 (12/04/2009)   HDL: 57.80 (12/04/2009)   LDL: 37 (12/04/2009)   TG: 124.0 (12/04/2009) stable overall by hx and exam, ok to continue meds/tx as is   Problem # 4:  DIABETES MELLITUS, TYPE II (ICD-250.00)  Her updated medication list for this problem includes:    Atacand 32 Mg Tabs (  Candesartan cilexetil) .Marland Kitchen... Take 1 tablet by mouth once a day    Glucophage Xr 500 Mg Tb24 (Metformin hcl) ..... One by mouth two times a day    Actos 45 Mg Tabs (Pioglitazone hcl) .Marland Kitchen... 1 by mouth once daily    Adult Aspirin Ec Low Strength 81 Mg Tbec (Aspirin) .Marland Kitchen... 1 by mouth once daily    Aspir-low 81 Mg Tbec (Aspirin) .Marland Kitchen... 1po once daily    Diovan 320 Mg Tabs (Valsartan) .Marland Kitchen... 1 by mouth once daily  Labs Reviewed: Creat: 0.7 (12/04/2009)    Reviewed HgBA1c results: 6.8 (12/04/2009)  6.7 (06/04/2009) stable overall by hx and exam, ok to continue meds/tx as is   Complete Medication List: 1)  Atacand 32 Mg Tabs (Candesartan cilexetil) .... Take 1 tablet by mouth once a day 2)  Symbyax 6-50 Mg Caps (Olanzapine-fluoxetine hcl) .Marland Kitchen.. 1po once daily 3)  Crestor 20 Mg Tabs (Rosuvastatin calcium) .Marland Kitchen.. 1 by mouth once daily 4)  Glucophage Xr 500 Mg Tb24 (Metformin hcl) .... One by mouth two times a day 5)  Actos 45 Mg Tabs (Pioglitazone hcl) .Marland Kitchen.. 1 by mouth once daily 6)  Adult Aspirin Ec Low  Strength 81 Mg Tbec (Aspirin) .Marland Kitchen.. 1 by mouth once daily 7)  Clonazepam 0.5 Mg Tabs (Clonazepam) .Marland Kitchen.. 1 by mouth two times a day - to fill Jun 28, 2009 8)  Amlodipine Besylate 10 Mg Tabs (Amlodipine besylate) .Marland Kitchen.. 1 by mouth once daily 9)  Hydrocodone-acetaminophen 5-325 Mg Tabs (Hydrocodone-acetaminophen) .Marland Kitchen.. 1po q 6 hrs as needed pain 10)  Aspir-low 81 Mg Tbec (Aspirin) .Marland Kitchen.. 1po once daily 11)  Diovan 320 Mg Tabs (Valsartan) .Marland Kitchen.. 1 by mouth once daily  Patient Instructions: 1)  please take 2 per day of the sample symbyax 3/25 mg 2)  then use the prescription to continue the medication after that 3)  You will be contacted about the referral(s) to: Psychiatry 4)  Please go to [facility] or the ER for further thoughts of suicide 5)  Continue all previous medications as before this visit  6)  Please schedule a follow-up appointment in 6 months with prior: 7)  BMP prior to visit, ICD-9: 250.02 8)  Lipid Panel prior to visit, ICD-9: 9)  HbgA1C prior to visit, ICD-9: 10)  Please return sooner if needed Prescriptions: SYMBYAX 6-50 MG CAPS (OLANZAPINE-FLUOXETINE HCL) 1po once daily  #30 x 11   Entered and Authorized by:   Corwin Levins MD   Signed by:   Corwin Levins MD on 12/10/2009   Method used:   Print then Give to Patient   RxID:   [id number]

## 2010-07-24 NOTE — Progress Notes (Signed)
Summary: medication refill  Phone Note Refill Request Message from:  Fax from Pharmacy on July 10, 2010 9:04 AM  Refills Requested: Medication #1:  CLONAZEPAM 0.5 MG TABS 1 by mouth two times a day - to fill july 7   Dosage confirmed as above?Dosage Confirmed   Last Refilled: 12/26/2009   Notes: [facility] [phone] Initial call taken by: Zella Ball Ewing CMA Duncan Dull),  July 10, 2010 9:05 AM  Follow-up for Phone Call        ok to give #60, no refills - for this Latasha Wang pt - thanks - printed and signed and given to Robin Follow-up by: Newt Lukes MD,  July 10, 2010 12:34 PM  Additional Follow-up for Phone Call Additional follow up Details #1::        faxed hardcopy to pharmacy Additional Follow-up by: Robin Ewing CMA (AAMA),  July 10, 2010 1:16 PM    New/Updated Medications: CLONAZEPAM 0.5 MG TABS (CLONAZEPAM) 1 by mouth two times a day Prescriptions: CLONAZEPAM 0.5 MG TABS (CLONAZEPAM) 1 by mouth two times a day  #60 x 0   Entered and Authorized by:   Newt Lukes MD   Signed by:   Newt Lukes MD on 07/10/2010   Method used:   Printed then [facility]ed [facility] ...       [facility]  [address] [phone][facility]       [address]       [place], [place]  [id number]       Ph: [phone] or [phone]       Fax: [phone]   RxID:   [id number]

## 2010-07-24 NOTE — Progress Notes (Signed)
Summary: Atacand  Phone Note Refill Request   Refills Requested: Medication #1:  DIOVAN 320 MG TABS 1 by mouth once daily.   Dosage confirmed as above?Dosage Confirmed Initial call taken by: Scharlene Gloss,  August 27, 2009 7:45 AM Summary of Call: The office rec'd note from [facility] stating that Atacand will no longer be covered. Per MD we can change patient to Diovan, please call and ask patient. **No answer/no machine. Initial call taken by: Lucious Groves,  August 23, 2009 10:34 AM  Follow-up for Phone Call        left message on machine to call back to office. Follow-up by: Lucious Groves,  August 26, 2009 11:49 AM  Additional Follow-up for Phone Call Additional follow up Details #1::        per patient, Diovan is ok. please advise Additional Follow-up by: Sydell Axon,  August 26, 2009 3:56 PM    Additional Follow-up for Phone Call Additional follow up Details #2::    ok to change to diovan 320 mg  - 1 per day , per routine - to robin Follow-up by: Corwin Levins MD,  August 26, 2009 5:13 PM  New/Updated Medications: DIOVAN 320 MG TABS (VALSARTAN) 1 by mouth once daily Prescriptions: DIOVAN 320 MG TABS (VALSARTAN) 1 by mouth once daily  #30 x 10   Entered by:   Scharlene Gloss   Authorized by:   Corwin Levins MD   Signed by:   Scharlene Gloss on 08/27/2009   Method used:   [facility] ...       [facility]  [address] [phone][facility] (retail[facility]       [address]       [place], [place]  [id number]       Ph: [phone] or [phone]       Fax: [phone]   RxID:   [id number]

## 2010-07-24 NOTE — Progress Notes (Signed)
Summary: Records request from [facility]  Request for records received from [facility]. Request forwarded to [employer]. Wilder Glade  August 15, 2009 4:35 PM

## 2010-07-24 NOTE — Progress Notes (Signed)
Summary: [employer] forms from patient  [employer] forms from patient   Imported By: Lester [place] 06/18/2010 10:48:27  _____________________________________________________________________  External Attachment:    Type:   Image     Comment:   External Document

## 2010-07-24 NOTE — Progress Notes (Signed)
Summary: Medication REfill  Phone Note Refill Request Message from:  Fax from Pharmacy on December 26, 2009 9:03 AM  Refills Requested: Medication #1:  CLONAZEPAM 0.5 MG TABS 1 by mouth two times a day - to fill jan 7   Dosage confirmed as above?Dosage Confirmed   Last Refilled: 06/28/2009   [facility] [address]  [phone] Initial call taken by: Zella Ball Ewing CMA (AAMA),  December 26, 2009 9:04 AM    New/Updated Medications: CLONAZEPAM 0.5 MG TABS (CLONAZEPAM) 1 by mouth two times a day - to fill December 26, 2009 Prescriptions: CLONAZEPAM 0.5 MG TABS (CLONAZEPAM) 1 by mouth two times a day - to fill December 26, 2009  #60 x 5   Entered and Authorized by:   Corwin Levins MD   Signed by:   Corwin Levins MD on 12/26/2009   Method used:   Print then Give to Patient   RxID:   [id number]  done hardcopy to [facility] side B - dahlia  Corwin Levins MD  December 26, 2009 9:06 AM   Rx faxed to pharmacy Margaret Pyle, CMA  December 26, 2009 9:10 AM

## 2010-07-29 ENCOUNTER — Telehealth: Payer: Self-pay | Admitting: Internal Medicine

## 2010-07-30 ENCOUNTER — Telehealth: Payer: Self-pay | Admitting: Internal Medicine

## 2010-08-07 NOTE — Progress Notes (Signed)
Summary: refill   Phone Note From Pharmacy   [facility]  [address] [phone]* Summary of Call: Pharmacy is requesting a refill on pts. clonzepam . I denied due to refill on 07/10/2010. Called the pharmacy and they did not fill the rx  the last refill was on 06/13/2010. They are requesting to refill the Clonzazepam. Initial call taken by: Robin Ewing CMA (AAMA),  July 30, 2010 11:44 AM  Follow-up for Phone Call        ok for refill    Prescriptions: CLONAZEPAM 0.5 MG TABS (CLONAZEPAM) 1 by mouth two times a day  #60 x 2   Entered and Authorized by:   Corwin Levins MD   Signed by:   Corwin Levins MD on 07/30/2010   Method used:   Print then Give to Patient   RxID:   [id number]  done hardcopy to [facility] side B - dahlia Corwin Levins MD  July 30, 2010 12:33 PM   Rx faxed to pharmacy Margaret Pyle, CMA  July 30, 2010 1:28 PM

## 2010-08-07 NOTE — Progress Notes (Signed)
  Phone Note Call from Patient Call back at Home Phone [phone]   Caller: Daughter Reason for Call: Talk to Nurse Summary of Call: Requesting to speak to nurse. Pharmacy been trying to get clonazepam refill per pharmacy md has'nt reponded back. Wanting to know why? Initial call taken by: Orlan Leavens RMA,  July 29, 2010 2:44 PM  Follow-up for Phone Call        Called pt. to inform prescription has been denied due to being too early to refill as last prescription sent in 07/10/2010 [facility]. Follow-up by: Zella Ball Ewing CMA Duncan Dull),  July 29, 2010 4:17 PM

## 2010-09-05 LAB — COMPREHENSIVE METABOLIC PANEL
Albumin: 4.1 g/dL (ref 3.5–5.2)
Alkaline Phosphatase: 53 U/L (ref 39–117)
BUN: 13 mg/dL (ref 6–23)
CO2: 24 mEq/L (ref 19–32)
Chloride: 107 mEq/L (ref 96–112)
GFR calc Af Amer: >60 mL/min (ref >60)
GFR calc non Af Amer: 59 mL/min — ABNORMAL LOW (ref >60)
Glucose, Bld: 157 mg/dL — ABNORMAL HIGH (ref 70–99)
Potassium: 3.8 mEq/L (ref 3.5–5.1)
Total Bilirubin: 1.8 mg/dL — ABNORMAL HIGH (ref 0.3–1.2)

## 2010-09-05 LAB — URINALYSIS, ROUTINE W REFLEX MICROSCOPIC
Bilirubin Urine: NEGATIVE
Glucose, UA: NEGATIVE mg/dL
Hgb urine dipstick: NEGATIVE
Ketones, ur: NEGATIVE mg/dL
Nitrite: NEGATIVE
Urobilinogen, UA: 0.2 mg/dL (ref 0.0–1.0)
pH: 7.5 (ref 5.0–8.0)

## 2010-09-05 LAB — GLUCOSE, CAPILLARY: Glucose-Capillary: 140 mg/dL — ABNORMAL HIGH (ref 70–99)

## 2010-09-05 LAB — DIFFERENTIAL
Basophils Absolute: 0 10*3/uL (ref 0.0–0.1)
Eosinophils Relative: 0 % (ref 0–5)
Lymphocytes Relative: 27 % (ref 12–46)
Lymphs Abs: 1.9 10*3/uL (ref 0.7–4.0)
Monocytes Absolute: 0.4 10*3/uL (ref 0.1–1.0)
Neutro Abs: 4.7 10*3/uL (ref 1.7–7.7)

## 2010-09-05 LAB — POCT CARDIAC MARKERS
CKMB, poc: <1 ng/mL — ABNORMAL LOW (ref 1.0–8.0)
Troponin i, poc: <0.05 ng/mL (ref 0.00–0.09)

## 2010-09-05 LAB — CBC
MCH: 28.3 pg (ref 26.0–34.0)
MCV: 86.6 fL (ref 78.0–100.0)
Platelets: 186 10*3/uL (ref 150–400)

## 2010-10-29 ENCOUNTER — Other Ambulatory Visit: Payer: Self-pay | Admitting: Internal Medicine

## 2010-11-04 NOTE — Assessment & Plan Note (Signed)
[facility]                         GASTROENTEROLOGY OFFICE NOTE   NAME:Latasha Wang, Latasha Wang                       MRN:          [id number]  DATE:03/21/2007                            DOB:          08/08/1940    CHIEF COMPLAINT:  Persistent epigastric pain.   Ms. Latasha Wang returns.  I had seen her earlier in the month, and her  epigastric pain was better.  However, it has apparently recurred.  She  says she has a frontal headache, nausea, dizziness, and epigastric pain  every morning for several hours.  She was hospitalized briefly for a  couple of days.  MRI was unrevealing (brain MRI).  She is not on  metoclopramide as thought by the hospitalist.  She is due to see Dr. Artist Pais  on October 1.  She remains on glipizide, Azur.  Zegerid was started  recently and seems to be helping her epigastric pain somewhat.  She is  also on Simvastatin, meclizine, and Lorazepam.   See my previous notes for details of workups.  She has not had endoscopy  here, but she has had it at least twice in [place] as well as  colonoscopy with CT scanning.  She is gaining weight.  She has  documented gastroparesis, but metoclopramide made her sleepy and caused  some difficulties for her with depression, confusion, mood changes.   PHYSICAL EXAMINATION:  GENERAL:  She is in no acute distress.  VITAL SIGNS:  Weight 126 pounds, pulse 86, blood pressure 130/80, weight  is stable to increased.  ABDOMEN:  When she lies down for me to check her abdomen, she is sort of  diffusely tender and hyperesthetic, but she raises right back up and  complains of terrible dizziness though I see no nystagmus.  She did the  same thing when I saw her previously.   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  She has multiple other symptoms that I am not sure of the  cause.  One possibility would be some sort of migraine syndrome though  this is not classic for that.  Her  daughter is here, and questions  whether or not this could be a gallbladder problem.  She had an  ultrasound in January in [facility] which showed changes consistent  with fatty liver but no stones.  I doubt this is her gallbladder.  She  has responded to Zegerid somewhat.  Certainly could be a component of  reflux.   PLAN:  1. Continue Zegerid.  2. Will discuss other options of treatment with Dr. Artist Pais.  Question      directed more at neuropathic problems with something like a low      dose tricyclic versus Neurontin or Lyrica or perhaps Cymbalta.      BuSpar is another potential option as well.    Iva Boop, MD,FACG  Electronically Signed   CEG/MedQ  DD: 03/21/2007  DT: 03/21/2007  Job #: [id number]   cc:   Barbette Hair. Artist Pais, DO

## 2010-11-04 NOTE — Assessment & Plan Note (Signed)
[facility]                                 ON-CALL NOTE   NAME:[name], [name]                         MRN:          [id number]  DATE:03/21/2007                            DOB:          28-Jan-1941    Latasha Wang, date of birth Feb 27, 1941.  A patient of Dr. Artist Pais.  Phone  call comes from [place], [phone].  Phone call comes at  8:16 p.m. on September 29.  Dr. Artist Pais wrote a prescription for meclizine  25 mg 1/2 q.8 h. p.r.n. with three refills but gave no clonidine.   PLAN:  I told him to fill 60 and to keep the three refills.  Also told  the pharmacist to let the patient know often it is cheaper over-the-  counter.     Karie Schwalbe, MD  Electronically Signed    RIL/MedQ  DD: 03/21/2007  DT: 03/22/2007  Job #: [id number]   cc:   Barbette Hair. Artist Pais, DO

## 2010-11-04 NOTE — Discharge Summary (Signed)
NAMETINLEY, ROUGHT NO.:  [id number]   MEDICAL RECORD NO.:  [id number]          PATIENT TYPE:  INP   LOCATION:  6734                         FACILITY:  [facility]   PHYSICIAN:  Valerie A. Felicity Coyer, MDDATE OF BIRTH:  [birth year]   DATE OF ADMISSION:  03/09/2007  DATE OF DISCHARGE:                               DISCHARGE SUMMARY   DISCHARGE DIAGNOSES:  1. Headache/dizziness with negative CT head and negative MRI.  2. Escherichia coli urinary tract infection.  3. Chronic abdominal pain likely secondary to gastroparesis.  4. Diabetes type 2, stable.  5. Hypertension, stable.  6. Dyslipidemia, stable.   HISTORY OF PRESENT ILLNESS:  Latasha Wang is a 70 year old female who  was admitted on March 09, 2007, with multiple complaints.  She noted  dizziness and headache which she stated woke her from her sleep at 5  a.m. on the morning of admission.  This was also associated with stomach  pain and nausea.  The patient notes subjective fever at home as well as  feeling cold and shaky.  She denied any recent travel upon admission or  any sick contacts.  She was admitted for further evaluation and  treatment.   PAST MEDICAL HISTORY:  1. Gastroparesis with chronic abdominal pain, had abnormal gastric      emptying scan on Nov 19, 2006.  2. Diabetes.  3. Hypertension.  4. Depression.  5. Hyperlipidemia.   COURSE OF HOSPITALIZATION:  #1 - HEADACHE/DIZZINESS.  The patient was  admitted.  She was given p.r.n. meclizine.  She also underwent a CT of  the head and MRI of the brain which were performed upon admission.  These were negative for any acute changes.  At this time we plan to  refer the patient for outpatient vestibular rehab and continue p.r.n.  meclizine.   #2 - E. COLI UTI.  The patient has received 2 days of IV Rocephin.  She  will be changed to p.o. Ceftin at time of discharge.  Final  sensitivities are pending.   #3 - CHRONIC ABDOMINAL PAIN LIKELY SECONDARY  TO GASTROPARESIS.  The  patient notes that she has been tolerating Reglan.  However, she has  only been using it on a p.r.n. basis for nausea.  Recommended to the  patient to try q.a.c. and h.s. dosing schedule.   MEDICATIONS AT TIME OF DISCHARGE:  1. Azor 10/40 one tablet p.o. daily.  2. Prozac 20 mg p.o. daily.  3. Glipizide 5 mg p.o. b.i.d.  4. Ativan 0.5 mg p.o. t.i.d.  5. Zocor 20 mg p.o. daily.  6. Ceftin 500 mg p.o. b.i.d. x5 days.  7. Meclizine 25 mg p.o. t.i.d.  8. Reglan 5 mg p.o. q.a.c. and h.s.  9. Tylenol 650 mg p.o. q.6h.   DISPOSITION:  The patient be discharged to home.   FOLLOWUP:  The patient has been instructed to follow up with Dr. Thomos Lemons on October 1 at 11:30 a.m.      Sandford Craze, NP      Raenette Rover. Felicity Coyer, MD  Electronically Signed    MO/MEDQ  D:  03/11/2007  T:  03/11/2007  Job:  [id number]   cc:   Barbette Hair. Artist Pais, DO

## 2010-11-04 NOTE — Assessment & Plan Note (Signed)
[facility]                         GASTROENTEROLOGY OFFICE NOTE   NAME:Latasha Wang, Latasha Wang                         MRN:          [id number]  DATE:11/16/2006                            DOB:          1940-12-11    CHIEF COMPLAINT:  Nausea, epigastric pain.   ASSESSMENT:  A 70 year old Asian woman who has had a several month  history of nausea but no vomiting.  She has epigastric pain.  It may be  stress-related.  A workup in [place], [place], including two  upper endoscopies and CT scanning (I do not have records) has been  unrevealing.  Her labs here are normal at this time.  She is a diabetic,  so diabetic gastroparesis is possible.  She is having depression  problems, so a functional disorder or somatization is also possible.   PLAN:  1. Request records from [facility] to try to sort out what she has      had done.  2. Go ahead with a gastric emptying study.  I do not think that has      been done.  3. I have refilled her Lorazepam today, 0.5 mg 1-2 three times a day      p.r.n., #90 with no refills.  4. 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  There is significant      stress associated with this.  She is planning on returning to this      area where her family and her ex-husband are.  This may help, as I      think the psychosocial stressors are a significant part of her      problems at this time, based upon what I know but will need to see      those other records.   HISTORY:  A 70 year old woman with problems as outlined above.  Was in  the [facility] recently.  Was admitted earlier this year to an  [place] hospital and also was seen as an outpatient by  a doctor up there, a gastroenterologist.  We will request those records.  She is describing nausea and epigastric pain.  She denies vomiting now  but when she was seen on Nov 12, 2006,  she apparently had some vomiting.  This was in the [facility].  I have reviewed those records from the  [place].  She was given Zofran tablets, which seem to have helped some.  CBC  was normal.  A urinalysis was negative.  CMET was normal.  A lipase was  normal.  There are no radiologic studies regarding this.  See the  medical history form for further details.  There are no bowel habit  issues.   MEDICATIONS:  1. Glipizide 5 mg b.i.d.  2. Lorazepam 0.5 mg 1-2 three times a day p.r.n.  3. Zegerid 20 mg daily, question 40 mg.  4. An over-the-counter acid reducer b.i.d.  5. Mirtazapine 30 mg daily.  6. [name]  300 mg daily.  7. Zofran p.r.n.   There are no known drug allergies.   PAST MEDICAL HISTORY:  1. Hypertension.  2. Dyslipidemia.  3. Diabetes mellitus type II.  4. Cataract surgery.  5. 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  6. Depression.   FAMILY HISTORY:  No GI problems reported.   DRUG ALLERGIES:  None known.   SOCIAL HISTORY:  As described above.  Ex-husband is here with her today.  No alcohol, tobacco, or drugs.   REVIEW OF SYSTEMS:  Notable for new anxiety and depression problems.  She is not suicidal, although she wishes she were dead at times, she  said, i.e., I would be better off if I died, but I don't want to hurt  myself or kill myself.   PHYSICAL EXAMINATION:  VITAL SIGNS:  Height 5 feet 9.  Weight 119  pounds.  Blood pressure 132/80, pulse 76.  HEENT:  Eyes anicteric.  Normal mouth and pharynx.  NECK:  Supple.  No thyromegaly.  CHEST:  Clear.  HEART:  S1 and S2.  No gallops.  ABDOMEN:  Soft, nontender without organomegaly or mass.  RECTAL:  In the presence of female nursing staff, heme-negative brown  stool, no mass.  EXTREMITIES:  No edema.  SKIN:  No rash.  PSYCH:  She is alert and oriented x3.  She does appear anxious at times.   NOTE:  I have requested records of CT scans, discharge summaries,  endoscopy reports, pathology reports, and labs, and I  have reviewed the  labs and ED reports, and Dr. Olegario Messier notes in our charts.   NOTE:  Patient is going back to [place] next week.  I have advised  her to call me for followup when she returns to this area once she has  completed her followup.  She may need to see her doctors up there.  I  think she is going to need to obtain a psychiatrist down here.  The  patient and her ex-husband understand those issues.     Iva Boop, MD,FACG  Electronically Signed    CEG/MedQ  DD: 11/16/2006  DT: 11/17/2006  Job #: [id number]   cc:   Barbette Hair. Artist Pais, DO

## 2010-11-04 NOTE — Assessment & Plan Note (Signed)
[facility]                         GASTROENTEROLOGY OFFICE NOTE   NAME:Henk, Latasha Wang                       MRN:          [id number]  DATE:02/28/2007                            DOB:          [birth year]    CHIEF COMPLAINT:  Abdominal pain.   Latasha Wang had called the office awhile back complaining of increasing  epigastric pain.  I had seen her and diagnosed her with gastroparesis  previously.  She told me she is actually feeling better.  She had also  called stating she thought that the Reglan was giving her problems with  depression, confusion, anxiety, mood changes, constipation, loss of  appetite and dizziness.  That is why we scheduled this appointment.  In  the interim, it sounds like her antidepressants have been reoriented,  sertraline was stopped and Symbyax was restarted, and she tells me she  is somewhat better.  She is also on Azor.  She is still having  disturbing spells of dizziness, or at least that is something new, with  sudden, short spells of dizziness and perhaps even vertigo.  In fact,  she had two of these episodes in the office when I laid her down and  then when she sat back up.  However, I saw no nystagmus or any other  changes, no sweats or anything like that.  Her abdominal exam is benign  with a nontender epigastrium.   Her medications are listed and reviewed in the chart.   Her past medical history is reviewed and unchanged from my May note,  otherwise as mentioned in Dr. Olegario Messier note.  The dizziness is really  chronic.  She has had that problem before and evaluated and apparently  saw an ENT physician.  She is apparently taking metoclopramide once a  day.  There is no evidence of tardive dyskinesia.   PHYSICAL:  As described above.  Height 4 feet 9 inches.  Weight 125  pounds.  Pulse 80.  Blood pressure 110/68.   ASSESSMENT:  1. Epigastric pain.  This seems to be medication-related as best I can      tell.  It  certainly could be neuropathic from diabetes.  She had an      EGD and a colonoscopy in [facility] earlier this year.  I do not      think any further workup beyond what we have done makes sense at      this time.  She tells me it is better.  2. Dizziness, chronic, recurrent.  I could see no nystagmus.  She did      not look unwell though she had this sudden self-limited spell of      dizziness twice.  It lasted for less than 10 seconds, I think, each      time.  I am not sure what that is, but I have referred her back to      Dr. Artist Pais for further evaluation of that though it sounds like that      may have already been worked up in the past year.  I had originally  thought maybe she was a little bit orthostatic when she was      standing in the morning but then she had this spell in front of me      and I do not think that to be the case.  She had an MRI in [facility] that was negative in 2005.   I will see the patient back as needed.     Iva Boop, MD,FACG  Electronically Signed    CEG/MedQ  DD: 02/28/2007  DT: 03/01/2007  Job #: [id number]   cc:   Barbette Hair. Artist Pais, DO

## 2010-11-28 ENCOUNTER — Other Ambulatory Visit: Payer: Self-pay

## 2010-11-28 MED ORDER — CLONAZEPAM 0.5 MG PO TABS: 0.5000 mg | ORAL_TABLET | Freq: Two times a day (BID) | ORAL | Status: DC

## 2010-11-28 NOTE — Telephone Encounter (Signed)
Faxed hardcopy to pharmacy. 

## 2010-12-02 ENCOUNTER — Other Ambulatory Visit (INDEPENDENT_AMBULATORY_CARE_PROVIDER_SITE_OTHER): Payer: Medicare Other

## 2010-12-02 DIAGNOSIS — Z0000 Encounter for general adult medical examination without abnormal findings: Secondary | ICD-10-CM

## 2010-12-02 DIAGNOSIS — IMO0001 Reserved for inherently not codable concepts without codable children: Secondary | ICD-10-CM

## 2010-12-02 LAB — CBC WITH DIFFERENTIAL/PLATELET
Eosinophils Absolute: 0.1 10*3/uL (ref 0.0–0.7)
Eosinophils Relative: 1.3 % (ref 0.0–5.0)
HCT: 35.2 % — ABNORMAL LOW (ref 36.0–46.0)
Lymphs Abs: 2 10*3/uL (ref 0.7–4.0)
MCHC: 33.7 g/dL (ref 30.0–36.0)
MCV: 89.1 fl (ref 78.0–100.0)
Monocytes Absolute: 0.3 10*3/uL (ref 0.1–1.0)
Platelets: 169 10*3/uL (ref 150.0–400.0)
RDW: 15.9 % — ABNORMAL HIGH (ref 11.5–14.6)
WBC: 4.5 10*3/uL (ref 4.5–10.5)

## 2010-12-02 LAB — URINALYSIS
Ketones, ur: NEGATIVE
Specific Gravity, Urine: 1.02 (ref 1.000–1.030)
Total Protein, Urine: NEGATIVE
Urine Glucose: NEGATIVE
pH: 6 (ref 5.0–8.0)

## 2010-12-02 LAB — HEMOGLOBIN A1C: Hgb A1c MFr Bld: 6.8 % — ABNORMAL HIGH (ref 4.6–6.5)

## 2010-12-02 LAB — HEPATIC FUNCTION PANEL
ALT: 17 U/L (ref 0–35)
AST: 24 U/L (ref 0–37)
Alkaline Phosphatase: 54 U/L (ref 39–117)
Bilirubin, Direct: 0.2 mg/dL (ref 0.0–0.3)
Total Bilirubin: 1.7 mg/dL — ABNORMAL HIGH (ref 0.3–1.2)

## 2010-12-02 LAB — LIPID PANEL
Cholesterol: 162 mg/dL (ref 0–200)
Triglycerides: 103 mg/dL (ref 0.0–149.0)

## 2010-12-02 LAB — BASIC METABOLIC PANEL
BUN: 12 mg/dL (ref 6–23)
Creatinine, Ser: 0.8 mg/dL (ref 0.4–1.2)
GFR: 72.44 mL/min (ref >60.00)
Potassium: 4 mEq/L (ref 3.5–5.1)

## 2010-12-04 ENCOUNTER — Other Ambulatory Visit: Payer: Self-pay | Admitting: Internal Medicine

## 2010-12-04 DIAGNOSIS — Z0000 Encounter for general adult medical examination without abnormal findings: Secondary | ICD-10-CM

## 2010-12-10 ENCOUNTER — Encounter: Payer: Self-pay | Admitting: Internal Medicine

## 2010-12-10 DIAGNOSIS — Z0000 Encounter for general adult medical examination without abnormal findings: Secondary | ICD-10-CM | POA: Insufficient documentation

## 2010-12-10 DIAGNOSIS — Z0001 Encounter for general adult medical examination with abnormal findings: Secondary | ICD-10-CM | POA: Insufficient documentation

## 2010-12-11 ENCOUNTER — Encounter: Payer: Self-pay | Admitting: Internal Medicine

## 2010-12-11 ENCOUNTER — Ambulatory Visit (INDEPENDENT_AMBULATORY_CARE_PROVIDER_SITE_OTHER): Payer: Medicare Other | Admitting: Internal Medicine

## 2010-12-11 VITALS — BP 110/80 | HR 74 | Temp 98.6°F | Ht <= 58 in | Wt 147.5 lb

## 2010-12-11 DIAGNOSIS — Z0000 Encounter for general adult medical examination without abnormal findings: Secondary | ICD-10-CM

## 2010-12-11 DIAGNOSIS — F329 Major depressive disorder, single episode, unspecified: Secondary | ICD-10-CM

## 2010-12-11 DIAGNOSIS — I10 Essential (primary) hypertension: Secondary | ICD-10-CM

## 2010-12-11 DIAGNOSIS — E785 Hyperlipidemia, unspecified: Secondary | ICD-10-CM

## 2010-12-11 DIAGNOSIS — E119 Type 2 diabetes mellitus without complications: Secondary | ICD-10-CM

## 2010-12-11 MED ORDER — ESCITALOPRAM OXALATE 10 MG PO TABS: 10.0000 mg | ORAL_TABLET | Freq: Every day | ORAL | Status: DC

## 2010-12-11 NOTE — Assessment & Plan Note (Signed)
stable overall by hx and exam, most recent data reviewed with pt, and pt to continue medical treatment as before  Lab Results  Component Value Date   HGBA1C 6.8* 12/02/2010

## 2010-12-11 NOTE — Assessment & Plan Note (Signed)
stable overall by hx and exam, most recent data reviewed with pt, and pt to continue medical treatment as before  BP Readings from Last 3 Encounters:  12/11/10 110/80  06/09/10 122/68  12/10/09 122/70

## 2010-12-11 NOTE — Assessment & Plan Note (Addendum)
stable overall by hx and exam but persistent mild, most recent data reviewed with pt, and pt to continue medical treatment as before - tx with lexapro 10 mg, declines counseling at this time, verified nonsuicidal

## 2010-12-11 NOTE — Assessment & Plan Note (Signed)

## 2010-12-11 NOTE — Progress Notes (Signed)
Subjective:    Patient ID: Latasha Wang, female    DOB: Mar 03, 1941, 70 y.o.   MRN: [id number]  HPI  Here for wellness and f/u;  Overall doing ok;  Pt denies CP, worsening SOB, DOE, wheezing, orthopnea, PND, worsening LE edema, palpitations, dizziness or syncope.  Pt denies neurological change such as new Headache, facial or extremity weakness.  Pt denies polydipsia, polyuria, or low sugar symptoms. Pt states overall good compliance with treatment and medications, good tolerability, and trying to follow lower cholesterol diet.  Pt has had worsening depressive symptoms,  But denies suicidal ideation or panic. No fever, wt loss, night sweats, loss of appetite, or other constitutional symptoms.  Pt states good ability with ADL's, low fall risk, home safety reviewed and adequate, no significant changes in hearing or vision, and occasionally active with exercise - 3 times per wk. Past Medical History  Diagnosis Date  . ANXIETY 06/20/2008  . DEPRESSION 05/09/2007  . FATTY LIVER DISEASE 09/01/2007  . Gastroparesis 09/01/2007  . GERD 05/09/2007  . HYPERLIPIDEMIA 07/05/2007  . HYPERTENSION 05/09/2007  . LATERAL EPICONDYLITIS, RIGHT 12/17/2008  . LIVER FUNCTION TESTS, ABNORMAL 07/19/2007  . MYALGIA 05/09/2007  . Other malaise and fatigue 07/05/2007  . VERTIGO, CHRONIC 09/01/2007   Past Surgical History  Procedure Date  . Cataract extraction, bilateral     reports that she has never smoked. She does not have any smokeless tobacco history on file. She reports that she does not drink alcohol or use illicit drugs. family history is not on file. No Known Allergies Current Outpatient Prescriptions on File Prior to Visit  Medication Sig Dispense Refill  . amLODipine (NORVASC) 10 MG tablet Take 10 mg by mouth daily.        . candesartan (ATACAND) 32 MG tablet Take 32 mg by mouth daily.        . clonazePAM (KLONOPIN) 0.5 MG tablet Take 1 tablet (0.5 mg total) by mouth 2 (two) times daily.  60 tablet  2  .  meclizine (ANTIVERT) 12.5 MG tablet 1-2 by mouth every 6 hours as needed for dizziness       . metFORMIN (GLUCOPHAGE-XR) 500 MG 24 hr tablet TAKE 1 TABLET BY MOUTH TWICE A DAY  180 tablet  2  . olanzapine-FLUoxetine (SYMBYAX) 6-50 MG per capsule Take 1 capsule by mouth daily.        . Omega-3 Fatty Acids (FISH OIL) 1000 MG CAPS Take by mouth 2 (two) times daily.        . pioglitazone (ACTOS) 45 MG tablet Take 45 mg by mouth daily.        . rosuvastatin (CRESTOR) 10 MG tablet Take 10 mg by mouth daily.        . valsartan (DIOVAN) 320 MG tablet Take 320 mg by mouth daily.        Marland Kitchen aspirin 81 MG tablet Take 81 mg by mouth daily.        Marland Kitchen HYDROcodone-acetaminophen (NORCO) 5-325 MG per tablet Take 1 tablet by mouth every 6 (six) hours as needed.         Review of Systems Review of Systems  Constitutional: Negative for diaphoresis, activity change, appetite change and unexpected weight change.  HENT: Negative for hearing loss, ear pain, facial swelling, mouth sores and neck stiffness.   Eyes: Negative for pain, redness and visual disturbance.  Respiratory: Negative for shortness of breath and wheezing.   Cardiovascular: Negative for chest pain and palpitations.  Gastrointestinal: Negative for diarrhea,  blood in stool, abdominal distention and rectal pain.  Genitourinary: Negative for hematuria, flank pain and decreased urine volume.  Musculoskeletal: Negative for myalgias and joint swelling.  Skin: Negative for color change and wound.  Neurological: Negative for syncope and numbness.  Hematological: Negative for adenopathy.  Psychiatric/Behavioral: Negative for hallucinations, self-injury, decreased concentration and agitation.      Objective:   Physical Exam BP 110/80  Pulse 74  Temp(Src) 98.6 F (37 C) (Oral)  Ht 4\' 9"  (1.448 m)  Wt 147 lb 8 oz (66.906 kg)  BMI 31.92 kg/m2  SpO2 97% Physical Exam  VS noted, obese, marked depressed affect Constitutional: Pt is oriented to person,  place, and time. Appears well-developed and well-nourished.  HENT:  Head: Normocephalic and atraumatic.  Right Ear: External ear normal.  Left Ear: External ear normal.  Nose: Nose normal.  Mouth/Throat: Oropharynx is clear and moist.  Eyes: Conjunctivae and EOM are normal. Pupils are equal, round, and reactive to light.  Neck: Normal range of motion. Neck supple. No JVD present. No tracheal deviation present.  Cardiovascular: Normal rate, regular rhythm, normal heart sounds and intact distal pulses.   Pulmonary/Chest: Effort normal and breath sounds normal.  Abdominal: Soft. Bowel sounds are normal. There is no tenderness.  Musculoskeletal: Normal range of motion. Exhibits no edema.  Lymphadenopathy:  Has no cervical adenopathy.  Neurological: Pt is alert and oriented to person, place, and time. Pt has normal reflexes. No cranial nerve deficit.  Skin: Skin is warm and dry. No rash noted.  Psychiatric: . Behavior is normal. 1+ nervous        Assessment & Plan:

## 2010-12-11 NOTE — Assessment & Plan Note (Signed)
stable overall by hx and exam, most recent data reviewed with pt, and pt to continue medical treatment as before  Lab Results  Component Value Date   LDLCALC 78 12/02/2010

## 2010-12-11 NOTE — Patient Instructions (Signed)
Take all new medications as prescribed  - the generic lexapro 10 mg per day for depression Continue all other medications as before Please return in 6 mo with Lab testing done 3-5 days before

## 2010-12-20 ENCOUNTER — Other Ambulatory Visit: Payer: Self-pay | Admitting: Internal Medicine

## 2010-12-22 NOTE — Telephone Encounter (Signed)
Please call pt;  I though she had not been taking this;  It did not seem to be working recently , which is why we started the lexapro 10 mg  Please verify above with pt;  We could go back to the symbyax but did not seem to be helping at last visit, and should not need both symbyax and lexapro

## 2010-12-22 NOTE — Telephone Encounter (Signed)
Request for Symbyax refill [last refill 12/10/09 #30x11]

## 2010-12-22 NOTE — Telephone Encounter (Signed)
LMOM for callback to clarify on medication.

## 2010-12-23 NOTE — Telephone Encounter (Signed)
Left message on machine for pt to return my call  

## 2010-12-25 NOTE — Telephone Encounter (Signed)
Please note this pt I believe is from [place] and doesn't speak english well at all;  Her "good friend" (female, with a young child too) was with her at last visit and translated very well

## 2010-12-26 ENCOUNTER — Other Ambulatory Visit: Payer: Self-pay

## 2010-12-26 NOTE — Telephone Encounter (Signed)
Pharmacy informed.

## 2010-12-26 NOTE — Telephone Encounter (Signed)
Same answer as before °

## 2010-12-30 ENCOUNTER — Other Ambulatory Visit: Payer: Self-pay | Admitting: Internal Medicine

## 2010-12-30 DIAGNOSIS — Z1231 Encounter for screening mammogram for malignant neoplasm of breast: Secondary | ICD-10-CM

## 2011-01-09 ENCOUNTER — Ambulatory Visit: Payer: Medicare Other

## 2011-01-09 ENCOUNTER — Ambulatory Visit (INDEPENDENT_AMBULATORY_CARE_PROVIDER_SITE_OTHER): Payer: Medicare Other

## 2011-01-09 ENCOUNTER — Inpatient Hospital Stay (INDEPENDENT_AMBULATORY_CARE_PROVIDER_SITE_OTHER)
Admission: RE | Admit: 2011-01-09 | Discharge: 2011-01-09 | Disposition: A | Discharge status: Home / Self Care | Payer: Medicare Other | Source: Ambulatory Visit | Attending: Emergency Medicine | Admitting: Emergency Medicine

## 2011-01-09 DIAGNOSIS — IMO0002 Reserved for concepts with insufficient information to code with codable children: Secondary | ICD-10-CM

## 2011-01-24 ENCOUNTER — Emergency Department (HOSPITAL_COMMUNITY): Payer: Medicare Other

## 2011-01-24 ENCOUNTER — Emergency Department (HOSPITAL_COMMUNITY)
Admission: EM | Admit: 2011-01-24 | Discharge: 2011-01-24 | Disposition: A | Discharge status: Home / Self Care | Payer: Medicare Other | Attending: Emergency Medicine | Admitting: Emergency Medicine

## 2011-01-24 DIAGNOSIS — Z79899 Other long term (current) drug therapy: Secondary | ICD-10-CM | POA: Insufficient documentation

## 2011-01-24 DIAGNOSIS — E119 Type 2 diabetes mellitus without complications: Secondary | ICD-10-CM | POA: Insufficient documentation

## 2011-01-24 DIAGNOSIS — I10 Essential (primary) hypertension: Secondary | ICD-10-CM | POA: Insufficient documentation

## 2011-01-24 DIAGNOSIS — E785 Hyperlipidemia, unspecified: Secondary | ICD-10-CM | POA: Insufficient documentation

## 2011-01-24 DIAGNOSIS — R51 Headache: Secondary | ICD-10-CM | POA: Insufficient documentation

## 2011-01-24 DIAGNOSIS — K219 Gastro-esophageal reflux disease without esophagitis: Secondary | ICD-10-CM | POA: Insufficient documentation

## 2011-01-24 DIAGNOSIS — R42 Dizziness and giddiness: Secondary | ICD-10-CM | POA: Insufficient documentation

## 2011-01-24 LAB — COMPREHENSIVE METABOLIC PANEL
ALT: 19 U/L (ref 0–35)
Alkaline Phosphatase: 60 U/L (ref 39–117)
GFR calc Af Amer: >60 mL/min (ref >60)
Glucose, Bld: 107 mg/dL — ABNORMAL HIGH (ref 70–99)
Potassium: 3.8 mEq/L (ref 3.5–5.1)
Sodium: 142 mEq/L (ref 135–145)
Total Protein: 7.4 g/dL (ref 6.0–8.3)

## 2011-01-24 LAB — GLUCOSE, CAPILLARY

## 2011-01-24 LAB — DIFFERENTIAL
Basophils Absolute: 0 10*3/uL (ref 0.0–0.1)
Basophils Relative: 0 % (ref 0–1)
Monocytes Absolute: 0.4 10*3/uL (ref 0.1–1.0)
Neutro Abs: 6 10*3/uL (ref 1.7–7.7)
Neutrophils Relative %: 73 % (ref 43–77)

## 2011-01-24 LAB — CBC
Hemoglobin: 11.1 g/dL — ABNORMAL LOW (ref 12.0–15.0)
MCHC: 32.8 g/dL (ref 30.0–36.0)
RBC: 3.82 MIL/uL — ABNORMAL LOW (ref 3.87–5.11)
WBC: 8.2 10*3/uL (ref 4.0–10.5)

## 2011-01-24 LAB — URINALYSIS, ROUTINE W REFLEX MICROSCOPIC
Bilirubin Urine: NEGATIVE
Hgb urine dipstick: NEGATIVE
Specific Gravity, Urine: 1.009 (ref 1.005–1.030)
pH: 8 (ref 5.0–8.0)

## 2011-01-24 LAB — PROTIME-INR: INR: 0.96 (ref 0.00–1.49)

## 2011-01-24 LAB — APTT: aPTT: 38 seconds — ABNORMAL HIGH (ref 24–37)

## 2011-01-26 ENCOUNTER — Other Ambulatory Visit: Payer: Self-pay

## 2011-01-26 MED ORDER — OLANZAPINE-FLUOXETINE HCL 6-50 MG PO CAPS: 1.0000 | ORAL_CAPSULE | Freq: Every day | ORAL | Status: DC

## 2011-01-28 ENCOUNTER — Other Ambulatory Visit: Payer: Self-pay

## 2011-01-28 MED ORDER — CLONAZEPAM 0.5 MG PO TABS: 0.5000 mg | ORAL_TABLET | Freq: Two times a day (BID) | ORAL | Status: DC

## 2011-01-28 NOTE — Telephone Encounter (Signed)
Faxed hardcopy to [facility] [address] [phone] 

## 2011-02-04 ENCOUNTER — Other Ambulatory Visit: Payer: Self-pay | Admitting: Internal Medicine

## 2011-03-08 ENCOUNTER — Other Ambulatory Visit: Payer: Self-pay | Admitting: Internal Medicine

## 2011-03-22 ENCOUNTER — Other Ambulatory Visit: Payer: Self-pay | Admitting: Internal Medicine

## 2011-03-30 ENCOUNTER — Other Ambulatory Visit: Payer: Self-pay

## 2011-03-30 MED ORDER — CLONAZEPAM 0.5 MG PO TABS: 0.5000 mg | ORAL_TABLET | Freq: Two times a day (BID) | ORAL | Status: DC

## 2011-03-30 NOTE — Telephone Encounter (Signed)
Triage Call Report Triage Record Num: [id number] Operator: Reino Bellis Patient Name: Latasha Wang Call Date & Time: 03/29/2011 1:50:00PM Patient Phone: [phone] PCP: Oliver Barre Patient Gender: Female PCP Fax : [phone] Patient DOB: 1940/09/15 Practice Name: [facility] Reason for Call: Pt. calling requesting a refill for Clonazapam. Pt. has been out of medication since 03/22/11. Pharmacy sent a request to office on 03/28/11 but MD never sent in a refill. Pt. c/o feeling anxious and dizzy. Onset: 03/25/11. All emergent sxs r/o per Dizziness or Vertigo Protocol except symptoms began after starting or changing dose or prescription, nonprescription, alternative medication or illicit drug. Advised pt. to see MD within 24 hours regarding symptoms and med refill. Pt. instructed to call office in AM on 03/30/11. [facility] would [facility] pt[facility] emergency supply of Clonazapam to last until 03/30/11. Pt. made aware. Pharmacy also advised to refax refill request to office for MD review. Protocol(s) Used: Dizziness or Vertigo Recommended Outcome per Protocol: See Provider within 24 hours Reason for Outcome: Symptoms began after starting or changing dose of prescription, nonprescription, alternative medication, or illicit drug Care Advice: Call EMS 911 if patient develops confusion, decreased level of consciousness, chest pain, shortness of breath, or focal neurologic abnormalities such as facial droop or weakness of one extremity. ~ ~ DO NOT drive until condition evaluated. ~ Call provider immediately if have difficulty walking, vision problems, or weakness. ~ CAUTIONS ~ List, or take, all current prescription(s), nonprescription or alternative medication(s) to provider for evaluation. 03/29/2011 2:10:23PM Page 1 of 1 [email or web address]

## 2011-03-30 NOTE — Telephone Encounter (Signed)
Called the pharmacy and the last refill the patient received of clonazepam was on 02/16/2011 with no refills. The pharmacist never received the one sent in august.

## 2011-03-30 NOTE — Telephone Encounter (Signed)
Patient is requesting a refill on clonazepam  

## 2011-03-30 NOTE — Telephone Encounter (Signed)
Faxed hardcopy to [facility] [address] [phone] and called the patients daughter informed prescription sent in to pharmacy.

## 2011-03-30 NOTE — Telephone Encounter (Signed)
Too soon, should have enough for bid use to nov 6 - last rx was aug 8 with 60 and 2 ref

## 2011-03-30 NOTE — Telephone Encounter (Signed)
Done to robin hardcopy 

## 2011-03-31 LAB — INFLUENZA A+B VIRUS AG-DIRECT(RAPID)
Inflenza A Ag: NEGATIVE
Influenza B Ag: NEGATIVE

## 2011-04-02 LAB — CULTURE, BLOOD (ROUTINE X 2): Culture: NO GROWTH

## 2011-04-02 LAB — COMPREHENSIVE METABOLIC PANEL
ALT: 32
AST: 24
Alkaline Phosphatase: 51
CO2: 22
Calcium: 9
Chloride: 104
GFR calc non Af Amer: >60
Glucose, Bld: 172 — ABNORMAL HIGH
Potassium: 3.4 — ABNORMAL LOW
Sodium: 137
Total Bilirubin: 2.1 — ABNORMAL HIGH

## 2011-04-02 LAB — BASIC METABOLIC PANEL
Calcium: 9.1
GFR calc non Af Amer: >60
Potassium: 3.5
Sodium: 135

## 2011-04-02 LAB — DIFFERENTIAL
Basophils Absolute: 0
Basophils Relative: 0
Lymphocytes Relative: 18
Lymphs Abs: 2
Monocytes Absolute: 0.6
Monocytes Relative: 6
Neutro Abs: 6.6
Neutro Abs: 7.7
Neutrophils Relative %: 74

## 2011-04-02 LAB — LIPASE, BLOOD: Lipase: 22

## 2011-04-02 LAB — CBC
Hemoglobin: 12.3
MCHC: 34.1
MCV: 85.3
Platelets: 272
RBC: 4.23
RDW: 13.6
WBC: 10.4

## 2011-04-02 LAB — I-STAT 8, (EC8 V) (CONVERTED LAB)
Acid-base deficit: 1
Chloride: 108
HCT: 43
Operator id: 288831
TCO2: 24
pCO2, Ven: 36.4 — ABNORMAL LOW

## 2011-04-02 LAB — URINALYSIS, ROUTINE W REFLEX MICROSCOPIC
Ketones, ur: NEGATIVE
Nitrite: POSITIVE — AB
Specific Gravity, Urine: 1.014
pH: 5.5

## 2011-04-02 LAB — POCT I-STAT CREATININE: Operator id: 288831

## 2011-04-02 LAB — URINE MICROSCOPIC-ADD ON

## 2011-04-02 LAB — URINE CULTURE: Colony Count: >=100000

## 2011-04-20 ENCOUNTER — Other Ambulatory Visit: Payer: Self-pay

## 2011-04-20 MED ORDER — ROSUVASTATIN CALCIUM 10 MG PO TABS: 10.0000 mg | ORAL_TABLET | Freq: Every day | ORAL | Status: DC

## 2011-05-11 ENCOUNTER — Other Ambulatory Visit: Payer: Self-pay | Admitting: Internal Medicine

## 2011-06-09 ENCOUNTER — Other Ambulatory Visit: Payer: Self-pay | Admitting: Internal Medicine

## 2011-06-10 ENCOUNTER — Other Ambulatory Visit: Payer: Self-pay | Admitting: Internal Medicine

## 2011-06-10 ENCOUNTER — Other Ambulatory Visit (INDEPENDENT_AMBULATORY_CARE_PROVIDER_SITE_OTHER): Payer: Medicare Other

## 2011-06-10 LAB — BASIC METABOLIC PANEL
BUN: 14 mg/dL (ref 6–23)
Calcium: 8.9 mg/dL (ref 8.4–10.5)
Creatinine, Ser: 0.8 mg/dL (ref 0.4–1.2)
GFR: 71.34 mL/min (ref >60.00)

## 2011-06-10 LAB — LIPID PANEL
Cholesterol: 127 mg/dL (ref 0–200)
LDL Cholesterol: 40 mg/dL (ref 0–99)
Total CHOL/HDL Ratio: 2
Triglycerides: 70 mg/dL (ref 0.0–149.0)
VLDL: 14 mg/dL (ref 0.0–40.0)

## 2011-06-12 ENCOUNTER — Encounter: Payer: Self-pay | Admitting: Internal Medicine

## 2011-06-12 ENCOUNTER — Ambulatory Visit (INDEPENDENT_AMBULATORY_CARE_PROVIDER_SITE_OTHER): Payer: Medicare Other | Admitting: Internal Medicine

## 2011-06-12 VITALS — BP 102/60 | HR 86 | Temp 100.1°F | Resp 14 | Ht <= 58 in | Wt 152.0 lb

## 2011-06-12 DIAGNOSIS — R062 Wheezing: Secondary | ICD-10-CM

## 2011-06-12 DIAGNOSIS — J209 Acute bronchitis, unspecified: Secondary | ICD-10-CM

## 2011-06-12 DIAGNOSIS — Z0000 Encounter for general adult medical examination without abnormal findings: Secondary | ICD-10-CM

## 2011-06-12 DIAGNOSIS — E119 Type 2 diabetes mellitus without complications: Secondary | ICD-10-CM

## 2011-06-12 DIAGNOSIS — I10 Essential (primary) hypertension: Secondary | ICD-10-CM

## 2011-06-12 MED ORDER — HYDROCODONE-HOMATROPINE 5-1.5 MG/5ML PO SYRP: 5.0000 mL | ORAL_SOLUTION | Freq: Four times a day (QID) | ORAL | Status: AC | PRN

## 2011-06-12 MED ORDER — PREDNISONE 10 MG PO TABS: 10.0000 mg | ORAL_TABLET | Freq: Every day | ORAL | Status: DC

## 2011-06-12 MED ORDER — AZITHROMYCIN 250 MG PO TABS: ORAL_TABLET | ORAL | Status: AC

## 2011-06-12 NOTE — Patient Instructions (Signed)
Take all new medications as prescribed - the antibiotic, cough medicine, and prednisone Continue all other medications as before Please return in 6 mo with Lab testing done 3-5 days before

## 2011-06-13 ENCOUNTER — Encounter: Payer: Self-pay | Admitting: Internal Medicine

## 2011-06-13 NOTE — Progress Notes (Signed)
Subjective:    Patient ID: Latasha Wang, female    DOB: [birth year], 70 y.o.   MRN: [id number]  HPI  Here with acute onset mild to mod 2-3 days ST, HA, general weakness and malaise, with prod cough greenish sputum, but Pt denies chest pain, increased sob or doe, wheezing, orthopnea, PND, increased LE swelling, palpitations, dizziness or syncope, except for mild wheezing onset today with sob.  Here to f/u; overall doing ok,  Pt denies chest pain, increased sob or doe, wheezing, orthopnea, PND, increased LE swelling, palpitations, dizziness or syncope.  Pt denies new neurological symptoms such as new headache, or facial or extremity weakness or numbness   Pt denies polydipsia, polyuria, or low sugar symptoms such as weakness or confusion improved with po intake.  Pt states overall good compliance with meds, trying to follow lower cholesterol, diabetic diet, wt overall stable but little exercise however.  Past Medical History  Diagnosis Date  . ANXIETY 06/20/2008  . DEPRESSION 05/09/2007  . FATTY LIVER DISEASE 09/01/2007  . Gastroparesis 09/01/2007  . GERD 05/09/2007  . HYPERLIPIDEMIA 07/05/2007  . HYPERTENSION 05/09/2007  . LATERAL EPICONDYLITIS, RIGHT 12/17/2008  . LIVER FUNCTION TESTS, ABNORMAL 07/19/2007  . MYALGIA 05/09/2007  . Other malaise and fatigue 07/05/2007  . VERTIGO, CHRONIC 09/01/2007   Past Surgical History  Procedure Date  . Cataract extraction, bilateral     reports that she has never smoked. She does not have any smokeless tobacco history on file. She reports that she does not drink alcohol or use illicit drugs. family history is not on file. No Known Allergies Current Outpatient Prescriptions on File Prior to Visit  Medication Sig Dispense Refill  . ACTOS 45 MG tablet TAKE 1 TABLET EVERY DAY  90 tablet  3  . amLODipine (NORVASC) 10 MG tablet Take 10 mg by mouth daily.        Marland Kitchen aspirin 81 MG tablet Take 81 mg by mouth daily.        . candesartan (ATACAND) 32 MG tablet Take 32  mg by mouth daily.        . clonazePAM (KLONOPIN) 0.5 MG tablet Take 1 tablet (0.5 mg total) by mouth 2 (two) times daily. - to fill sept 6, 2012  60 tablet  2  . meclizine (ANTIVERT) 12.5 MG tablet TAKE 1 TO 2 TABLETS BY MOUTH EVERY 6 HOURS AS NEEDED FOR DIZINESS  60 tablet  1  . metFORMIN (GLUCOPHAGE-XR) 500 MG 24 hr tablet TAKE 1 TABLET BY MOUTH TWICE A DAY  180 tablet  2  . olanzapine-FLUoxetine (SYMBYAX) 6-50 MG per capsule Take 1 capsule by mouth daily.  30 capsule  8  . Omega-3 Fatty Acids (FISH OIL) 1000 MG CAPS Take by mouth 2 (two) times daily.        . rosuvastatin (CRESTOR) 10 MG tablet Take 1 tablet (10 mg total) by mouth daily.  90 tablet  3  . valsartan (DIOVAN) 320 MG tablet Take 320 mg by mouth daily.        Marland Kitchen HYDROcodone-acetaminophen (NORCO) 5-325 MG per tablet Take 1 tablet by mouth every 6 (six) hours as needed.         Review of Systems Review of Systems  Constitutional: Negative for diaphoresis and unexpected weight change.  HENT: Negative for drooling and tinnitus.   Eyes: Negative for photophobia and visual disturbance.  Respiratory: Negative for choking and stridor.   Gastrointestinal: Negative for vomiting and blood in stool.  Genitourinary: Negative for  hematuria and decreased urine volume.       Objective:   Physical Exam BP 102/60  Pulse 86  Temp(Src) 100.1 F (37.8 C) (Oral)  Resp 14  Ht 4\' 9"  (1.448 m)  Wt 152 lb (68.947 kg)  BMI 32.89 kg/m2  SpO2 92% Physical Exam  VS noted, mild ill appearing Constitutional: Pt appears well-developed and well-nourished.  HENT: Head: Normocephalic.  Right Ear: External ear normal.  Left Ear: External ear normal.  Bilat tm's mild erythema.  Sinus nontender.  Pharynx mild erythema Eyes: Conjunctivae and EOM are normal. Pupils are equal, round, and reactive to light.  Neck: Normal range of motion. Neck supple.  Cardiovascular: Normal rate and regular rhythm.   Pulmonary/Chest: Effort normal and breath sounds mild  decreased, with mild bilat wheeze, no rale Neurological: Pt is alert. No cranial nerve deficit.  Skin: Skin is warm. No erythema.  Psychiatric: Pt behavior is normal. Thought content normal.     Assessment & Plan:

## 2011-06-13 NOTE — Assessment & Plan Note (Signed)
stable overall by hx and exam, most recent data reviewed with pt, and pt to continue medical treatment as before. Pt to call for cbg> 200 on the steroid Lab Results  Component Value Date   HGBA1C 6.4 06/10/2011

## 2011-06-13 NOTE — Assessment & Plan Note (Signed)
Mild to mod, for antibx course,  to f/u any worsening symptoms or concerns 

## 2011-06-13 NOTE — Assessment & Plan Note (Signed)
stable overall by hx and exam, most recent data reviewed with pt, and pt to continue medical treatment as before  BP Readings from Last 3 Encounters:  06/12/11 102/60  12/11/10 110/80  06/09/10 122/68

## 2011-06-13 NOTE — Assessment & Plan Note (Signed)
Mild to mod, for predpack  course,  to f/u any worsening symptoms or concerns 

## 2011-06-30 ENCOUNTER — Other Ambulatory Visit: Payer: Self-pay

## 2011-06-30 MED ORDER — CLONAZEPAM 0.5 MG PO TABS: 0.5000 mg | ORAL_TABLET | Freq: Two times a day (BID) | ORAL | Status: DC

## 2011-06-30 NOTE — Telephone Encounter (Signed)
Faxed hardcopy to pharmacy. 

## 2011-06-30 NOTE — Telephone Encounter (Signed)
Done hardcopy to robin  

## 2011-07-01 ENCOUNTER — Other Ambulatory Visit: Payer: Self-pay | Admitting: Internal Medicine

## 2011-08-10 ENCOUNTER — Other Ambulatory Visit: Payer: Self-pay | Admitting: Internal Medicine

## 2011-08-13 ENCOUNTER — Other Ambulatory Visit: Payer: Self-pay

## 2011-08-13 MED ORDER — VALSARTAN 320 MG PO TABS: 320.0000 mg | ORAL_TABLET | Freq: Every day | ORAL | Status: DC

## 2011-09-04 ENCOUNTER — Other Ambulatory Visit: Payer: Self-pay | Admitting: Internal Medicine

## 2011-09-15 ENCOUNTER — Other Ambulatory Visit: Payer: Self-pay

## 2011-09-15 MED ORDER — AMLODIPINE BESYLATE 10 MG PO TABS: 10.0000 mg | ORAL_TABLET | Freq: Every day | ORAL | Status: DC

## 2011-10-12 ENCOUNTER — Other Ambulatory Visit: Payer: Self-pay

## 2011-10-12 MED ORDER — OLANZAPINE-FLUOXETINE HCL 6-50 MG PO CAPS: 1.0000 | ORAL_CAPSULE | Freq: Every day | ORAL | Status: DC

## 2011-10-30 ENCOUNTER — Emergency Department (HOSPITAL_COMMUNITY): Payer: Medicare Other

## 2011-10-30 ENCOUNTER — Emergency Department (HOSPITAL_COMMUNITY)
Admission: EM | Admit: 2011-10-30 | Discharge: 2011-10-30 | Disposition: A | Discharge status: Home / Self Care | Payer: Medicare Other | Attending: Emergency Medicine | Admitting: Emergency Medicine

## 2011-10-30 ENCOUNTER — Encounter (HOSPITAL_COMMUNITY): Payer: Self-pay | Admitting: *Deleted

## 2011-10-30 DIAGNOSIS — K219 Gastro-esophageal reflux disease without esophagitis: Secondary | ICD-10-CM | POA: Insufficient documentation

## 2011-10-30 DIAGNOSIS — Z7982 Long term (current) use of aspirin: Secondary | ICD-10-CM | POA: Insufficient documentation

## 2011-10-30 DIAGNOSIS — I10 Essential (primary) hypertension: Secondary | ICD-10-CM | POA: Insufficient documentation

## 2011-10-30 DIAGNOSIS — R51 Headache: Secondary | ICD-10-CM | POA: Insufficient documentation

## 2011-10-30 DIAGNOSIS — R42 Dizziness and giddiness: Secondary | ICD-10-CM

## 2011-10-30 DIAGNOSIS — E785 Hyperlipidemia, unspecified: Secondary | ICD-10-CM | POA: Insufficient documentation

## 2011-10-30 DIAGNOSIS — Z79899 Other long term (current) drug therapy: Secondary | ICD-10-CM | POA: Insufficient documentation

## 2011-10-30 DIAGNOSIS — F341 Dysthymic disorder: Secondary | ICD-10-CM | POA: Insufficient documentation

## 2011-10-30 LAB — COMPREHENSIVE METABOLIC PANEL
ALT: 16 U/L (ref 0–35)
Calcium: 9.5 mg/dL (ref 8.4–10.5)
Creatinine, Ser: 0.7 mg/dL (ref 0.50–1.10)
GFR calc Af Amer: >90 mL/min (ref >90)
Glucose, Bld: 107 mg/dL — ABNORMAL HIGH (ref 70–99)
Sodium: 142 mEq/L (ref 135–145)
Total Protein: 8 g/dL (ref 6.0–8.3)

## 2011-10-30 LAB — URINALYSIS, ROUTINE W REFLEX MICROSCOPIC
Hgb urine dipstick: NEGATIVE
Leukocytes, UA: NEGATIVE
Nitrite: NEGATIVE
Protein, ur: NEGATIVE mg/dL
Specific Gravity, Urine: 1.005 (ref 1.005–1.030)
Urobilinogen, UA: 0.2 mg/dL (ref 0.0–1.0)

## 2011-10-30 LAB — CBC
Platelets: 184 10*3/uL (ref 150–400)
RBC: 4.29 MIL/uL (ref 3.87–5.11)
RDW: 16.1 % — ABNORMAL HIGH (ref 11.5–15.5)
WBC: 5.4 10*3/uL (ref 4.0–10.5)

## 2011-10-30 LAB — DIFFERENTIAL
Basophils Absolute: 0 10*3/uL (ref 0.0–0.1)
Eosinophils Absolute: 0 10*3/uL (ref 0.0–0.7)
Eosinophils Relative: 0 % (ref 0–5)
Lymphs Abs: 1.5 10*3/uL (ref 0.7–4.0)
Monocytes Absolute: 0.3 10*3/uL (ref 0.1–1.0)

## 2011-10-30 MED ORDER — LORAZEPAM 2 MG/ML IJ SOLN
1.0000 mg | Freq: Once | INTRAMUSCULAR | Status: AC
  Administered 2011-10-30: 1 mg via INTRAVENOUS

## 2011-10-30 MED ORDER — MECLIZINE HCL 25 MG PO TABS
25.0000 mg | ORAL_TABLET | Freq: Once | ORAL | Status: AC
  Administered 2011-10-30: 25 mg via ORAL
  Filled 2011-10-30: qty 1

## 2011-10-30 MED ORDER — GADOBENATE DIMEGLUMINE 529 MG/ML IV SOLN
15.0000 mL | Freq: Once | INTRAVENOUS | Status: AC | PRN
  Administered 2011-10-30: 15 mL via INTRAVENOUS

## 2011-10-30 NOTE — Discharge Instructions (Signed)
Your studies here, including an MRI of your brain and MRA looking at the blood vessels in your brain and neck were negative and did not show any worrisome findings. It is important that you follow up with Dr. Jonny Ruiz as soon as possible for a recheck. You may take your normal home medications for dizziness. Return to the ED with worsening condition, chest pain/shortness of breath, or any other worrisome symptoms.  RESOURCE GUIDE  Dental Problems  Patients with Medicaid: [facility] [phone] [address]                                           [phone] [address] Phone:  [phone]                                                  Phone:  [phone]  If unable to pay or uninsured, contact:  [facility] or [facility]. to become qualified for the adult dental clinic.  Chronic Pain Problems Contact [facility]  [phone] Patients need to be referred by their primary care doctor.  Insufficient Money for Medicine Contact [employer]:  call "211" or [facility] [phone].  No Primary Care Doctor Call [facility]  [phone] Other agencies that provide inexpensive medical care    [facility]  [phone]    [facility]  [phone]    [facility]  [phone]    [facility]  [phone]    [facility]  [phone]    [facility]  [phone]  Psychological Services [facility]  [phone] [facility] Services  [phone] [facility]   [phone] (emergency services [phone])  Substance Abuse Resources [facility]  [phone] [facility] [phone] [facility] [phone] Floydene Flock [phone] Residential & Outpatient Substance Abuse Program  [phone]  Abuse/Neglect [facility] Child Abuse Hotline 3198382107 [facility] Child Abuse Hotline 2168598080 (After  Hours)  Emergency Shelter [facility] Ministries [phone]  Maternity Homes [facility] [place] [facility] 720-814-5568 [facility] [phone]  MRSA Hotline #:   936 212 0075    [facility] Resources  [facility]     [employer]                          [facility] Dept. [address] [place]                       [address]      [address]  Patrecia Pace  Michell Heinrich Phone:  [phone]                                   Phone:  [phone]                 Phone:  [phone]  [facility] Phone:  [phone]  [facility] Child Abuse Hotline (559)192-7613 (563) [phone] (After Hours)  Dizziness Dizziness is a common problem. It is a feeling of unsteadiness or lightheadedness. You may feel like you are about to faint. Dizziness can lead to injury if you stumble or fall. A person of any age group can suffer from dizziness, but dizziness is more common in older adults. CAUSES  Dizziness can be caused by many different things, including:  Middle ear problems.   Standing for too long.   Infections.   An allergic reaction.   Aging.   An emotional response to something, such as the sight of blood.   Side effects of medicines.   Fatigue.   Problems with circulation or blood pressure.   Excess use of alcohol, medicines, or illegal drug use.   Breathing too fast (hyperventilation).   An arrhythmia or problems with your heart rhythm.   Low red blood cell count (anemia).   Pregnancy.   Vomiting, diarrhea, fever, or other illnesses that cause dehydration.   Diseases or conditions such as Parkinson's disease, high blood pressure (hypertension), diabetes, and thyroid problems.   Exposure to extreme heat.  DIAGNOSIS  To find the cause of your dizziness, your caregiver may do a physical exam,  lab tests, radiologic imaging scans, or an electrocardiography test (ECG).  TREATMENT  Treatment of dizziness depends on the cause of your symptoms and can vary greatly. HOME CARE INSTRUCTIONS   Drink enough fluids to keep your urine clear or pale yellow. This is especially important in very hot weather. In the elderly, it is also important in cold weather.   If your dizziness is caused by medicines, take them exactly as directed. When taking blood pressure medicines, it is especially important to get up slowly.   Rise slowly from chairs and steady yourself until you feel okay.   In the morning, first sit up on the side of the bed. When this seems okay, stand slowly while holding onto something until you know your balance is fine.   If you need to stand in one place for a long time, be sure to move your legs often. Tighten and relax the muscles in your legs while standing.   If dizziness continues to be a problem, have someone stay with you for a day or two. Do this until you feel you are well enough to stay alone. Have the person call your caregiver if he or she notices changes in you that are concerning.   Do not drive or use heavy machinery if you feel dizzy.  SEEK IMMEDIATE MEDICAL CARE IF:   Your dizziness or lightheadedness gets worse.   You feel nauseous or vomit.   You develop problems with talking, walking, weakness, or using your arms, hands, or legs.   You are not thinking clearly or you have difficulty forming sentences. It may take a friend or family member to determine if your thinking is normal.   You develop chest pain, abdominal pain, shortness of breath, or sweating.   Your vision changes.   You notice any bleeding.   You  have side effects from medicine that seems to be getting worse rather than better.  MAKE SURE YOU:   Understand these instructions.   Will watch your condition.   Will get help right away if you are not doing well or get worse.  Document  Released: 12/02/2000 Document Revised: 05/28/2011 Document Reviewed: 12/26/2010 [facility] Patient Information 2012 [place], [place].

## 2011-10-30 NOTE — ED Notes (Signed)
Pt was at adult daycare.  Pt was normal on arrival this am.  Pt reported dizziness at 0945 with diaphoresis.  Pt has intermittent dizziness (usually related to DM).  Pt was alert and oriented for ems.  CBG was WNL.  Pt arrived as a code stroke and this was cancelled by Dr. Felix Pacini on arrival

## 2011-10-30 NOTE — ED Provider Notes (Signed)
History     CSN: [id number]  Arrival date & time 10/30/11  1106   First MD Initiated Contact with Patient 10/30/11 1110      Chief Complaint  Patient presents with  . Dizziness    (Consider location/radiation/quality/duration/timing/severity/associated sxs/prior treatment) HPI Comments: Patient arrives from daycare by EMS with gradual onset headache and dizziness started around 945. She has a history of vertigo and states this dizziness is similar. There is no speech change, focal weakness, numbness or tingling. She denies any chest pain or shortness of breath. She states the dizziness is similar to her previous vertigo but she still has a headache with some blurred vision.  The history is provided by the patient and the EMS personnel. The history is limited by a language barrier.    Past Medical History  Diagnosis Date  . ANXIETY 06/20/2008  . DEPRESSION 05/09/2007  . FATTY LIVER DISEASE 09/01/2007  . Gastroparesis 09/01/2007  . GERD 05/09/2007  . HYPERLIPIDEMIA 07/05/2007  . HYPERTENSION 05/09/2007  . LATERAL EPICONDYLITIS, RIGHT 12/17/2008  . LIVER FUNCTION TESTS, ABNORMAL 07/19/2007  . MYALGIA 05/09/2007  . Other malaise and fatigue 07/05/2007  . VERTIGO, CHRONIC 09/01/2007    Past Surgical History  Procedure Date  . Cataract extraction, bilateral     No family history on file.  History  Substance Use Topics  . Smoking status: Never Smoker   . Smokeless tobacco: Not on file  . Alcohol Use: No    OB History    Grav Para Term Preterm Abortions TAB SAB Ect Mult Living                  Review of Systems  Constitutional: Negative for fever, activity change and appetite change.  HENT: Negative for congestion and rhinorrhea.   Respiratory: Negative for chest tightness.   Cardiovascular: Negative for chest pain.  Gastrointestinal: Negative for nausea, vomiting and abdominal pain.  Musculoskeletal: Negative for back pain.  Neurological: Positive for dizziness,  light-headedness and headaches. Negative for syncope and speech difficulty.    Allergies  Review of patient's allergies indicates no known allergies.  Home Medications   Current Outpatient Rx  Name Route Sig Dispense Refill  . ACETAMINOPHEN 500 MG PO TABS Oral Take 1,000 mg by mouth every 6 (six) hours as needed. For pain    . AMLODIPINE BESYLATE 10 MG PO TABS Oral Take 1 tablet (10 mg total) by mouth daily. 90 tablet 3  . ASPIRIN 81 MG PO TABS Oral Take 81 mg by mouth daily.      Marland Kitchen CLONAZEPAM 0.5 MG PO TABS Oral Take 1 tablet (0.5 mg total) by mouth 2 (two) times daily. 60 tablet 2  . ESCITALOPRAM OXALATE 10 MG PO TABS Oral Take 10 mg by mouth daily.    Marland Kitchen HYDROCODONE-ACETAMINOPHEN 5-325 MG PO TABS Oral Take 1 tablet by mouth every 6 (six) hours as needed. For pain    . MECLIZINE HCL 12.5 MG PO TABS Oral Take 12.5-25 mg by mouth every 6 (six) hours as needed. For dizziness    . METFORMIN HCL ER (MOD) 500 MG PO TB24 Oral Take 500 mg by mouth 2 (two) times daily with a meal.    . OLANZAPINE-FLUOXETINE HCL 6-50 MG PO CAPS Oral Take 1 capsule by mouth daily. 30 capsule 11  . FISH OIL 1000 MG PO CAPS Oral Take 1,000 mg by mouth 2 (two) times daily.     Marland Kitchen PIOGLITAZONE HCL 45 MG PO TABS Oral Take 45  mg by mouth daily.    Marland Kitchen ROSUVASTATIN CALCIUM 10 MG PO TABS Oral Take 1 tablet (10 mg total) by mouth daily. 90 tablet 3  . VALSARTAN 320 MG PO TABS Oral Take 1 tablet (320 mg total) by mouth daily. 30 tablet 10  . ESCITALOPRAM OXALATE 10 MG PO TABS Oral Take 1 tablet (10 mg total) by mouth daily. 30 tablet 11    BP 111/42  Pulse 64  Temp 98.8 F (37.1 C)  Resp 17  SpO2 96%  Physical Exam  Constitutional: She is oriented to person, place, and time. She appears well-developed and well-nourished.  HENT:  Head: Normocephalic and atraumatic.  Mouth/Throat: Oropharynx is clear and moist. No oropharyngeal exudate.  Eyes: Conjunctivae are normal. Pupils are equal, round, and reactive to light.    Neck: Normal range of motion. Neck supple.       No meningismus  Cardiovascular: Normal rate, regular rhythm and normal heart sounds.   Pulmonary/Chest: Effort normal and breath sounds normal. No respiratory distress.  Abdominal: Soft. There is no tenderness. There is no rebound and no guarding.  Musculoskeletal: Normal range of motion. She exhibits no edema and no tenderness.  Neurological: She is alert and oriented to person, place, and time. No cranial nerve deficit.       Cranial nerves II through XII intact, 5 out of 5 strength throughout, no face asymmetry, tongue midline, no ataxia on finger to nose No nystagmus, no catch up saccades on head impulse testing. Test of skew negative.  Skin: Skin is warm.    ED Course  Procedures (including critical care time)  Labs Reviewed  CBC - Abnormal; Notable for the following:    RDW 16.1 (*)    All other components within normal limits  COMPREHENSIVE METABOLIC PANEL - Abnormal; Notable for the following:    Glucose, Bld 107 (*)    GFR calc non Af Amer 86 (*)    All other components within normal limits  DIFFERENTIAL  TROPONIN I  URINALYSIS, ROUTINE W REFLEX MICROSCOPIC   Ct Head Wo Contrast  10/30/2011  *RADIOLOGY REPORT*  Clinical Data: Dizziness and confusion.  History of hypertension and vertigo.  CT HEAD WITHOUT CONTRAST  Technique:  Contiguous axial images were obtained from the base of the skull through the vertex without contrast.  Comparison: Head CT 01/24/2011.  Findings: There is no evidence of acute intracranial hemorrhage, mass lesion, brain edema or extra-axial fluid collection.  The ventricles and subarachnoid spaces are appropriately sized for age. There is no CT evidence of acute cortical infarction.  Intracranial vascular calcifications are again noted.  The visualized paranasal sinuses are clear. The calvarium is intact. The mastoids and middle ears are clear.  IMPRESSION: Stable examination.  No acute intracranial  findings.  Original Report Authenticated By: Gerrianne Scale, M.D.     No diagnosis found.    MDM  Lightheadedness, dizziness, vision changes. Code stroke all prior to arrival and canceled on arrival. No focal deficits on exam.  Symptoms improved with meclizine and ativan.  No further vertigo or headache. CT head neg. MRI obtained to evaluate posterior circulation given age and risk factors.  D/w PAC Anne Shutter.    Date: 10/30/2011  Rate: 67  Rhythm: normal sinus rhythm  QRS Axis: normal  Intervals: normal  ST/T Wave abnormalities: normal  Conduction Disutrbances:none  Narrative Interpretation:   Old EKG Reviewed: unchanged    Glynn Octave, MD 10/30/11 1624

## 2011-10-30 NOTE — ED Notes (Signed)
Pt is waiting for daughter to come

## 2011-10-30 NOTE — ED Provider Notes (Signed)
4:00 PM Care seemed to the patient in the CDU. Patient presented to the ED today with dizziness with diaphoresis. She's had intermittent dizziness in the past. She is currently awaiting MRI/MRA. Provided this is normal, she can be discharged home.  5:56 PM Patient's studies have resulted. Unremarkable, with the exception of a mention of torturous vasculature of the neck proximally. Discussed this with Dr. Ignacia Palma, who contacted vascular surgery and spoke with Dr. Arbie Cookey. He states that this is an incidental finding. Patient reassessed and remains asymptomatic at this time. We will discharge her home. Findings were discussed with her and she verbalized understanding. Return precautions discussed.  Results for orders placed during the hospital encounter of 10/30/11  CBC      Component Value Range   WBC 5.4  4.0 - 10.5 (K/uL)   RBC 4.29  3.87 - 5.11 (MIL/uL)   Hemoglobin 12.4  12.0 - 15.0 (g/dL)   HCT 16.1  09.6 - 04.5 (%)   MCV 89.0  78.0 - 100.0 (fL)   MCH 28.9  26.0 - 34.0 (pg)   MCHC 32.5  30.0 - 36.0 (g/dL)   RDW 40.9 (*) 81.1 - 15.5 (%)   Platelets 184  150 - 400 (K/uL)  DIFFERENTIAL      Component Value Range   Neutrophils Relative 66  43 - 77 (%)   Neutro Abs 3.5  1.7 - 7.7 (K/uL)   Lymphocytes Relative 28  12 - 46 (%)   Lymphs Abs 1.5  0.7 - 4.0 (K/uL)   Monocytes Relative 6  3 - 12 (%)   Monocytes Absolute 0.3  0.1 - 1.0 (K/uL)   Eosinophils Relative 0  0 - 5 (%)   Eosinophils Absolute 0.0  0.0 - 0.7 (K/uL)   Basophils Relative 1  0 - 1 (%)   Basophils Absolute 0.0  0.0 - 0.1 (K/uL)  COMPREHENSIVE METABOLIC PANEL      Component Value Range   Sodium 142  135 - 145 (mEq/L)   Potassium 3.8  3.5 - 5.1 (mEq/L)   Chloride 104  96 - 112 (mEq/L)   CO2 25  19 - 32 (mEq/L)   Glucose, Bld 107 (*) 70 - 99 (mg/dL)   BUN 14  6 - 23 (mg/dL)   Creatinine, Ser 9.14  0.50 - 1.10 (mg/dL)   Calcium 9.5  8.4 - 78.2 (mg/dL)   Total Protein 8.0  6.0 - 8.3 (g/dL)   Albumin 4.3  3.5 - 5.2  (g/dL)   AST 23  0 - 37 (U/L)   ALT 16  0 - 35 (U/L)   Alkaline Phosphatase 57  39 - 117 (U/L)   Total Bilirubin 1.2  0.3 - 1.2 (mg/dL)   GFR calc non Af Amer 86 (*) >90 (mL/min)   GFR calc Af Amer >90  >90 (mL/min)  TROPONIN I      Component Value Range   Troponin I <0.30  <0.30 (ng/mL)  URINALYSIS, ROUTINE W REFLEX MICROSCOPIC      Component Value Range   Color, Urine YELLOW  YELLOW    APPearance CLEAR  CLEAR    Specific Gravity, Urine 1.005  1.005 - 1.030    pH 7.5  5.0 - 8.0    Glucose, UA NEGATIVE  NEGATIVE (mg/dL)   Hgb urine dipstick NEGATIVE  NEGATIVE    Bilirubin Urine NEGATIVE  NEGATIVE    Ketones, ur NEGATIVE  NEGATIVE (mg/dL)   Protein, ur NEGATIVE  NEGATIVE (mg/dL)   Urobilinogen, UA 0.2  0.0 - 1.0 (mg/dL)   Nitrite NEGATIVE  NEGATIVE    Leukocytes, UA NEGATIVE  NEGATIVE    Ct Head Wo Contrast  10/30/2011  *RADIOLOGY REPORT*  Clinical Data: Dizziness and confusion.  History of hypertension and vertigo.  CT HEAD WITHOUT CONTRAST  Technique:  Contiguous axial images were obtained from the base of the skull through the vertex without contrast.  Comparison: Head CT 01/24/2011.  Findings: There is no evidence of acute intracranial hemorrhage, mass lesion, brain edema or extra-axial fluid collection.  The ventricles and subarachnoid spaces are appropriately sized for age. There is no CT evidence of acute cortical infarction.  Intracranial vascular calcifications are again noted.  The visualized paranasal sinuses are clear. The calvarium is intact. The mastoids and middle ears are clear.  IMPRESSION: Stable examination.  No acute intracranial findings.  Original Report Authenticated By: Gerrianne Scale, M.D.   Mr [facility] Wo Contrast  10/30/2011  *RADIOLOGY REPORT*  Clinical Data:  71 year old female with vertigo, dizziness.  Contrast: 15mL MULTIHANCE GADOBENATE DIMEGLUMINE 529 MG/ML IV SOLN  Comparison: head CT 10/30/2011.  Brain MRI 03/10/2007.  MRI HEAD WITHOUT AND WITH  CONTRAST  Technique: Multiplanar, multiecho pulse sequences of the brain and surrounding structures were obtained according to standard protocol without and with intravenous contrast.  Findings:  Chronic partially empty sella configuration is stable. No restricted diffusion to suggest acute infarction.  No midline shift, mass effect, evidence of mass lesion, ventriculomegaly, extra-axial collection or acute intracranial hemorrhage. Cervicomedullary junction and pituitary are within normal limits. Major intracranial vascular flow voids are stable.  Stable cerebral volume.  Stable and normal for age gray and white matter signal. No abnormal enhancement identified.  Stable and negative visualized cervical spine.  Normal bone marrow signal.  Chronic hyperostosis of the calvarium.  Stable orbit soft tissues.  Stable paranasal sinuses and mastoid with minimal left ethmoid mucosal thickening. Negative scalp soft tissues.  Grossly normal visualized internal auditory structures.  IMPRESSION: 1.  Stable and normal for age MRI appearance of the brain. 2.  MRA findings are below.  MRA NECK WITHOUT AND WITH CONTRAST  Technique:  Angiographic images of the neck were obtained using MRA technique without and with intravenous contrast.  Carotid stenosis measurements (when applicable) are obtained utilizing NASCET criteria, using the distal internal carotid diameter as the denominator.  Findings:  Precontrast time-of-flight imaging reveals antegrade flow in both carotid and vertebral arteries.  Carotid bifurcations are within normal limits.  Postcontrast images reveal a three-vessel arch configuration.  All of the proximal great vessels are moderate to severely tortuous and have a somewhat kinked appearance at the thoracic inlet.  Right common carotid artery origin is within normal limits.  Aside from proximal tortuosity, the right common carotid artery is normal.  The right ICA origin and bulb are within normal limits. The cervical  right ICA is normal.  The proximal left common carotid arteries tortuous with mild areas of irregularity and several areas of a kinked appearance.  More distally the the mid and distal left CCA is within normal limits. There is mild irregularity at the left carotid bifurcation, mostly effecting the ECA origin.  The left ICA origin and bulb are widely patent.  The cervical left ICA is otherwise normal except for mild tortuosity.  Dominant left vertebral artery.  No significant stenosis at its origin.  Intermittent tortuosity throughout the neck.  No left vertebral artery stenosis.  Nondominant right vertebral artery origin is within normal limits. The right  vertebral is mildly tortuous.  No right vertebral artery stenosis.  Incidental widely fenestrated distal right vertebral to the vertebrobasilar junction (normal anatomic variation).  IMPRESSION: 1.  Severely tortuous proximal great vessels, all having a kinked appearance at the thoracic inlet. 2.  Otherwise no arterial stenosis in the neck.  Minimal to mild atherosclerosis in the neck. 3.  Intracranial findings are below.  MRA HEAD WITHOUT CONTRAST  Technique: Angiographic images of the Circle of Willis were obtained using MRA technique without  intravenous contrast.  Findings:  Antegrade flow in the posterior circulation. Fenestrated distal right vertebral artery (normal anatomic variation.).  No distal vertebral artery stenosis.  Normal left PICA.  Dominant right AICA.  No basilar artery stenosis.  Superior cerebellar artery and PCA origins are normal.  Posterior communicating arteries are diminutive or absent.  Bilateral PCA branches are mildly irregular but patent.  Antegrade flow in both ICA siphons.  Ophthalmic artery origins are within normal limits.  Mild supraclinoid ICA irregularity without stenosis.  Patent carotid termini.  MCA and ACA origins are normal. Diminutive but otherwise normal anterior communicating artery. Visualized ACA branches are within  normal limits.  M1 segment are patent with minimal irregularity.  Visualized bilateral MCA branches are within normal limits.  IMPRESSION: Negative for age intracranial MRA.  Normal anatomic variation of the distal right vertebral artery.  Original Report Authenticated By: Harley Hallmark, M.D.   Mr Angiogram Neck W Wo Contrast  10/30/2011  *RADIOLOGY REPORT*  Clinical Data:  71 year old female with vertigo, dizziness.  Contrast: 15mL MULTIHANCE GADOBENATE DIMEGLUMINE 529 MG/ML IV SOLN  Comparison: head CT 10/30/2011.  Brain MRI 03/10/2007.  MRI HEAD WITHOUT AND WITH CONTRAST  Technique: Multiplanar, multiecho pulse sequences of the brain and surrounding structures were obtained according to standard protocol without and with intravenous contrast.  Findings:  Chronic partially empty sella configuration is stable. No restricted diffusion to suggest acute infarction.  No midline shift, mass effect, evidence of mass lesion, ventriculomegaly, extra-axial collection or acute intracranial hemorrhage. Cervicomedullary junction and pituitary are within normal limits. Major intracranial vascular flow voids are stable.  Stable cerebral volume.  Stable and normal for age gray and white matter signal. No abnormal enhancement identified.  Stable and negative visualized cervical spine.  Normal bone marrow signal.  Chronic hyperostosis of the calvarium.  Stable orbit soft tissues.  Stable paranasal sinuses and mastoid with minimal left ethmoid mucosal thickening. Negative scalp soft tissues.  Grossly normal visualized internal auditory structures.  IMPRESSION: 1.  Stable and normal for age MRI appearance of the brain. 2.  MRA findings are below.  MRA NECK WITHOUT AND WITH CONTRAST  Technique:  Angiographic images of the neck were obtained using MRA technique without and with intravenous contrast.  Carotid stenosis measurements (when applicable) are obtained utilizing NASCET criteria, using the distal internal carotid diameter as  the denominator.  Findings:  Precontrast time-of-flight imaging reveals antegrade flow in both carotid and vertebral arteries.  Carotid bifurcations are within normal limits.  Postcontrast images reveal a three-vessel arch configuration.  All of the proximal great vessels are moderate to severely tortuous and have a somewhat kinked appearance at the thoracic inlet.  Right common carotid artery origin is within normal limits.  Aside from proximal tortuosity, the right common carotid artery is normal.  The right ICA origin and bulb are within normal limits. The cervical right ICA is normal.  The proximal left common carotid arteries tortuous with mild areas of irregularity and several areas of  a kinked appearance.  More distally the the mid and distal left CCA is within normal limits. There is mild irregularity at the left carotid bifurcation, mostly effecting the ECA origin.  The left ICA origin and bulb are widely patent.  The cervical left ICA is otherwise normal except for mild tortuosity.  Dominant left vertebral artery.  No significant stenosis at its origin.  Intermittent tortuosity throughout the neck.  No left vertebral artery stenosis.  Nondominant right vertebral artery origin is within normal limits. The right vertebral is mildly tortuous.  No right vertebral artery stenosis.  Incidental widely fenestrated distal right vertebral to the vertebrobasilar junction (normal anatomic variation).  IMPRESSION: 1.  Severely tortuous proximal great vessels, all having a kinked appearance at the thoracic inlet. 2.  Otherwise no arterial stenosis in the neck.  Minimal to mild atherosclerosis in the neck. 3.  Intracranial findings are below.  MRA HEAD WITHOUT CONTRAST  Technique: Angiographic images of the Circle of Willis were obtained using MRA technique without  intravenous contrast.  Findings:  Antegrade flow in the posterior circulation. Fenestrated distal right vertebral artery (normal anatomic variation.).  No  distal vertebral artery stenosis.  Normal left PICA.  Dominant right AICA.  No basilar artery stenosis.  Superior cerebellar artery and PCA origins are normal.  Posterior communicating arteries are diminutive or absent.  Bilateral PCA branches are mildly irregular but patent.  Antegrade flow in both ICA siphons.  Ophthalmic artery origins are within normal limits.  Mild supraclinoid ICA irregularity without stenosis.  Patent carotid termini.  MCA and ACA origins are normal. Diminutive but otherwise normal anterior communicating artery. Visualized ACA branches are within normal limits.  M1 segment are patent with minimal irregularity.  Visualized bilateral MCA branches are within normal limits.  IMPRESSION: Negative for age intracranial MRA.  Normal anatomic variation of the distal right vertebral artery.  Original Report Authenticated By: Harley Hallmark, M.D.   [name] Contrast  10/30/2011  *RADIOLOGY REPORT*  Clinical Data:  71 year old female with vertigo, dizziness.  Contrast: 15mL MULTIHANCE GADOBENATE DIMEGLUMINE 529 MG/ML IV SOLN  Comparison: head CT 10/30/2011.  Brain MRI 03/10/2007.  MRI HEAD WITHOUT AND WITH CONTRAST  Technique: Multiplanar, multiecho pulse sequences of the brain and surrounding structures were obtained according to standard protocol without and with intravenous contrast.  Findings:  Chronic partially empty sella configuration is stable. No restricted diffusion to suggest acute infarction.  No midline shift, mass effect, evidence of mass lesion, ventriculomegaly, extra-axial collection or acute intracranial hemorrhage. Cervicomedullary junction and pituitary are within normal limits. Major intracranial vascular flow voids are stable.  Stable cerebral volume.  Stable and normal for age gray and white matter signal. No abnormal enhancement identified.  Stable and negative visualized cervical spine.  Normal bone marrow signal.  Chronic hyperostosis of the calvarium.  Stable orbit soft  tissues.  Stable paranasal sinuses and mastoid with minimal left ethmoid mucosal thickening. Negative scalp soft tissues.  Grossly normal visualized internal auditory structures.  IMPRESSION: 1.  Stable and normal for age MRI appearance of the brain. 2.  MRA findings are below.  MRA NECK WITHOUT AND WITH CONTRAST  Technique:  Angiographic images of the neck were obtained using MRA technique without and with intravenous contrast.  Carotid stenosis measurements (when applicable) are obtained utilizing NASCET criteria, using the distal internal carotid diameter as the denominator.  Findings:  Precontrast time-of-flight imaging reveals antegrade flow in both carotid and vertebral arteries.  Carotid bifurcations are within  normal limits.  Postcontrast images reveal a three-vessel arch configuration.  All of the proximal great vessels are moderate to severely tortuous and have a somewhat kinked appearance at the thoracic inlet.  Right common carotid artery origin is within normal limits.  Aside from proximal tortuosity, the right common carotid artery is normal.  The right ICA origin and bulb are within normal limits. The cervical right ICA is normal.  The proximal left common carotid arteries tortuous with mild areas of irregularity and several areas of a kinked appearance.  More distally the the mid and distal left CCA is within normal limits. There is mild irregularity at the left carotid bifurcation, mostly effecting the ECA origin.  The left ICA origin and bulb are widely patent.  The cervical left ICA is otherwise normal except for mild tortuosity.  Dominant left vertebral artery.  No significant stenosis at its origin.  Intermittent tortuosity throughout the neck.  No left vertebral artery stenosis.  Nondominant right vertebral artery origin is within normal limits. The right vertebral is mildly tortuous.  No right vertebral artery stenosis.  Incidental widely fenestrated distal right vertebral to the  vertebrobasilar junction (normal anatomic variation).  IMPRESSION: 1.  Severely tortuous proximal great vessels, all having a kinked appearance at the thoracic inlet. 2.  Otherwise no arterial stenosis in the neck.  Minimal to mild atherosclerosis in the neck. 3.  Intracranial findings are below.  MRA HEAD WITHOUT CONTRAST  Technique: Angiographic images of the Circle of Willis were obtained using MRA technique without  intravenous contrast.  Findings:  Antegrade flow in the posterior circulation. Fenestrated distal right vertebral artery (normal anatomic variation.).  No distal vertebral artery stenosis.  Normal left PICA.  Dominant right AICA.  No basilar artery stenosis.  Superior cerebellar artery and PCA origins are normal.  Posterior communicating arteries are diminutive or absent.  Bilateral PCA branches are mildly irregular but patent.  Antegrade flow in both ICA siphons.  Ophthalmic artery origins are within normal limits.  Mild supraclinoid ICA irregularity without stenosis.  Patent carotid termini.  MCA and ACA origins are normal. Diminutive but otherwise normal anterior communicating artery. Visualized ACA branches are within normal limits.  M1 segment are patent with minimal irregularity.  Visualized bilateral MCA branches are within normal limits.  IMPRESSION: Negative for age intracranial MRA.  Normal anatomic variation of the distal right vertebral artery.  Original Report Authenticated By: Harley Hallmark, M.D.      Grant Fontana, [place] 10/30/11 1757

## 2011-10-30 NOTE — ED Provider Notes (Signed)
Medical screening examination/treatment/procedure(s) were conducted as a shared visit with non-physician practitioner(s) and myself.  I personally evaluated the patient during the encounter   Latasha Octave, MD 10/30/11 [phone]

## 2011-10-30 NOTE — ED Notes (Signed)
Pt ambulated to the bathroom without any problems.

## 2011-10-30 NOTE — ED Provider Notes (Signed)
1:43 PM Assumed care of patient in the CDU from Dr. Manus Gunning.  Patient came in today via EMS from adult daycare with a chief complaint of vertigo.  Patient does have a history of Vertigo.  Patient currently awaiting MRI brain and MRA.  Plan is for patient to be discharged home if the MRI and MRA are negative.  Reassessed patient.  She reports that she is not having any symptoms at this time.  No acute distress.  VSS.  Alert and orientated, CN II-XII grossly intact, grip strength 5/5 bilaterally, Heart RRR, Lungs CTAB 3:43 PM Patient discussed with Grant Fontana, PA-C who assumes care of patient in the CDU.  Pascal Lux [employer], PA-C 10/30/11 1544

## 2011-10-31 NOTE — ED Provider Notes (Signed)
Medical screening examination/treatment/procedure(s) were conducted as a shared visit with non-physician practitioner(s) and myself.  I personally evaluated the patient during the encounter Pt is a 71 year old woman seen for dizzinesss.  She had MRI/MRA of brain, which was normal.  She had MRA of the neck, which showed tortuous carotid arteries as they came out of the thoracic inlet.  This was discussed with Gretta Began, M.D., Vascular surgeon, who reviewed the films and advised that the tortuous vessels were an incidental finding.   Carleene Cooper III, MD 10/31/11 [phone]

## 2011-11-07 ENCOUNTER — Other Ambulatory Visit: Payer: Self-pay | Admitting: Internal Medicine

## 2011-11-09 ENCOUNTER — Other Ambulatory Visit: Payer: Self-pay

## 2011-11-09 MED ORDER — OLANZAPINE-FLUOXETINE HCL 6-50 MG PO CAPS: 1.0000 | ORAL_CAPSULE | Freq: Every day | ORAL | Status: DC

## 2011-11-18 ENCOUNTER — Other Ambulatory Visit: Payer: Self-pay

## 2011-11-18 MED ORDER — CLONAZEPAM 0.5 MG PO TABS: 0.5000 mg | ORAL_TABLET | Freq: Two times a day (BID) | ORAL | Status: DC

## 2011-11-18 NOTE — Telephone Encounter (Signed)
Done hardcopy to robin  

## 2011-11-19 NOTE — Telephone Encounter (Signed)
Faxed hardcopy to pharmacy. 

## 2011-11-27 ENCOUNTER — Other Ambulatory Visit (INDEPENDENT_AMBULATORY_CARE_PROVIDER_SITE_OTHER): Payer: Medicare Other

## 2011-11-27 DIAGNOSIS — Z0000 Encounter for general adult medical examination without abnormal findings: Secondary | ICD-10-CM

## 2011-11-27 DIAGNOSIS — E119 Type 2 diabetes mellitus without complications: Secondary | ICD-10-CM

## 2011-11-27 LAB — BASIC METABOLIC PANEL
Chloride: 111 mEq/L (ref 96–112)
GFR: 94.11 mL/min (ref >60.00)
Potassium: 4 mEq/L (ref 3.5–5.1)
Sodium: 144 mEq/L (ref 135–145)

## 2011-11-27 LAB — VITAMIN B12: Vitamin B-12: >1500 pg/mL — ABNORMAL HIGH (ref 211–911)

## 2011-11-27 LAB — URINALYSIS, ROUTINE W REFLEX MICROSCOPIC
Bilirubin Urine: NEGATIVE
Ketones, ur: NEGATIVE
Total Protein, Urine: NEGATIVE
pH: 6 (ref 5.0–8.0)

## 2011-11-27 LAB — HEPATIC FUNCTION PANEL
ALT: 17 U/L (ref 0–35)
AST: 25 U/L (ref 0–37)
Alkaline Phosphatase: 43 U/L (ref 39–117)
Bilirubin, Direct: 0.2 mg/dL (ref 0.0–0.3)
Total Bilirubin: 1.1 mg/dL (ref 0.3–1.2)

## 2011-11-27 LAB — LIPID PANEL
Cholesterol: 138 mg/dL (ref 0–200)
LDL Cholesterol: 50 mg/dL (ref 0–99)
Total CHOL/HDL Ratio: 2
VLDL: 22.4 mg/dL (ref 0.0–40.0)

## 2011-11-27 LAB — CBC WITH DIFFERENTIAL/PLATELET
Eosinophils Absolute: 0.1 10*3/uL (ref 0.0–0.7)
Eosinophils Relative: 1.4 % (ref 0.0–5.0)
HCT: 34.3 % — ABNORMAL LOW (ref 36.0–46.0)
Lymphs Abs: 1.9 10*3/uL (ref 0.7–4.0)
MCHC: 32.2 g/dL (ref 30.0–36.0)
MCV: 89.9 fl (ref 78.0–100.0)
Monocytes Absolute: 0.3 10*3/uL (ref 0.1–1.0)
Neutrophils Relative %: 49.6 % (ref 43.0–77.0)
Platelets: 184 10*3/uL (ref 150.0–400.0)
WBC: 4.6 10*3/uL (ref 4.5–10.5)

## 2011-11-27 LAB — TSH: TSH: 0.95 u[IU]/mL (ref 0.35–5.50)

## 2011-11-27 LAB — IBC PANEL
Iron: 67 ug/dL (ref 42–145)
Saturation Ratios: 17.5 % — ABNORMAL LOW (ref 20.0–50.0)

## 2011-11-27 LAB — HEMOGLOBIN A1C: Hgb A1c MFr Bld: 6.4 % (ref 4.6–6.5)

## 2011-11-27 LAB — MICROALBUMIN / CREATININE URINE RATIO: Microalb, Ur: 4.9 mg/dL — ABNORMAL HIGH (ref 0.0–1.9)

## 2011-12-07 ENCOUNTER — Ambulatory Visit (INDEPENDENT_AMBULATORY_CARE_PROVIDER_SITE_OTHER): Payer: Medicare Other | Admitting: Internal Medicine

## 2011-12-07 ENCOUNTER — Encounter: Payer: Self-pay | Admitting: Internal Medicine

## 2011-12-07 VITALS — BP 142/72 | HR 71 | Temp 99.5°F | Ht <= 58 in | Wt 163.1 lb

## 2011-12-07 DIAGNOSIS — Z0000 Encounter for general adult medical examination without abnormal findings: Secondary | ICD-10-CM

## 2011-12-07 DIAGNOSIS — E119 Type 2 diabetes mellitus without complications: Secondary | ICD-10-CM

## 2011-12-07 NOTE — Patient Instructions (Addendum)
Continue all other medications as before There are no changes today You are up to date with prevention Please have the pharmacy call with any refills you may need. Please continue your efforts at being more active, low cholesterol diet, and weight control. Please return in 6 mo with Lab testing done 3-5 days before

## 2011-12-07 NOTE — Assessment & Plan Note (Signed)

## 2011-12-13 ENCOUNTER — Encounter: Payer: Self-pay | Admitting: Internal Medicine

## 2011-12-13 NOTE — Assessment & Plan Note (Signed)
stable overall by hx and exam, most recent data reviewed with pt, and pt to continue medical treatment as before BP Readings from Last 3 Encounters:  12/07/11 142/72  10/30/11 125/51  06/12/11 102/60   Lab Results  Component Value Date   HGBA1C 6.4 11/27/2011

## 2011-12-13 NOTE — Progress Notes (Signed)
Subjective:    Patient ID: Latasha Wang, female    DOB: June 05, 1941, 71 y.o.   MRN: [id number]  HPI  Here for wellness and f/u;  Overall doing ok;  Pt denies CP, worsening SOB, DOE, wheezing, orthopnea, PND, worsening LE edema, palpitations, dizziness or syncope.  Pt denies neurological change such as new Headache, facial or extremity weakness.  Pt denies polydipsia, polyuria, or low sugar symptoms. Pt states overall good compliance with treatment and medications, good tolerability, and trying to follow lower cholesterol diet.  Pt denies worsening depressive symptoms, suicidal ideation or panic. No fever, wt loss, night sweats, loss of appetite, or other constitutional symptoms.  Pt states good ability with ADL's, low fall risk, home safety reviewed and adequate, no significant changes in hearing or vision, and occasionally active with exercise.  Eats no red meat to lower cholesterol.  Has a nonprod cough 5 days ago, now minor and just about gone. Past Medical History  Diagnosis Date  . ANXIETY 06/20/2008  . DEPRESSION 05/09/2007  . FATTY LIVER DISEASE 09/01/2007  . Gastroparesis 09/01/2007  . GERD 05/09/2007  . HYPERLIPIDEMIA 07/05/2007  . HYPERTENSION 05/09/2007  . LATERAL EPICONDYLITIS, RIGHT 12/17/2008  . LIVER FUNCTION TESTS, ABNORMAL 07/19/2007  . MYALGIA 05/09/2007  . Other malaise and fatigue 07/05/2007  . VERTIGO, CHRONIC 09/01/2007   Past Surgical History  Procedure Date  . Cataract extraction, bilateral     reports that she has never smoked. She does not have any smokeless tobacco history on file. She reports that she does not drink alcohol or use illicit drugs. family history is not on file. No Known Allergies Current Outpatient Prescriptions on File Prior to Visit  Medication Sig Dispense Refill  . acetaminophen (TYLENOL) 500 MG tablet Take 1,000 mg by mouth every 6 (six) hours as needed. For pain      . amLODipine (NORVASC) 10 MG tablet Take 1 tablet (10 mg total) by mouth daily.   90 tablet  3  . aspirin 81 MG tablet Take 81 mg by mouth daily.        . clonazePAM (KLONOPIN) 0.5 MG tablet Take 1 tablet (0.5 mg total) by mouth 2 (two) times daily.  60 tablet  2  . escitalopram (LEXAPRO) 10 MG tablet Take 10 mg by mouth daily.      Marland Kitchen HYDROcodone-acetaminophen (NORCO) 5-325 MG per tablet Take 1 tablet by mouth every 6 (six) hours as needed. For pain      . meclizine (ANTIVERT) 12.5 MG tablet Take 12.5-25 mg by mouth every 6 (six) hours as needed. For dizziness      . meclizine (ANTIVERT) 12.5 MG tablet TAKE 1 TO 2 TABLETS BY MOUTH EVERY 6 HOURS AS NEEDED FOR DIZINESS  60 tablet  1  . metFORMIN (GLUMETZA) 500 MG (MOD) 24 hr tablet Take 500 mg by mouth 2 (two) times daily with a meal.      . olanzapine-FLUoxetine (SYMBYAX) 6-50 MG per capsule Take 1 capsule by mouth daily.  30 capsule  11  . Omega-3 Fatty Acids (FISH OIL) 1000 MG CAPS Take 1,000 mg by mouth 2 (two) times daily.       . pioglitazone (ACTOS) 45 MG tablet Take 45 mg by mouth daily.      . rosuvastatin (CRESTOR) 10 MG tablet Take 1 tablet (10 mg total) by mouth daily.  90 tablet  3  . valsartan (DIOVAN) 320 MG tablet Take 1 tablet (320 mg total) by mouth daily.  30 tablet  10  . escitalopram (LEXAPRO) 10 MG tablet Take 1 tablet (10 mg total) by mouth daily.  30 tablet  11   Review of Systems Review of Systems  Constitutional: Negative for diaphoresis, activity change, appetite change and unexpected weight change.  HENT: Negative for hearing loss, ear pain, facial swelling, mouth sores and neck stiffness.   Eyes: Negative for pain, redness and visual disturbance.  Respiratory: Negative for shortness of breath and wheezing.   Cardiovascular: Negative for chest pain and palpitations.  Gastrointestinal: Negative for diarrhea, blood in stool, abdominal distention and rectal pain.  Genitourinary: Negative for hematuria, flank pain and decreased urine volume.  Musculoskeletal: Negative for myalgias and joint swelling.   Skin: Negative for color change and wound.  Neurological: Negative for syncope and numbness.  Hematological: Negative for adenopathy.  Psychiatric/Behavioral: Negative for hallucinations, self-injury, decreased concentration and agitation.      Objective:   Physical Exam BP 142/72  Pulse 71  Temp 99.5 F (37.5 C) (Oral)  Ht 4\' 9"  (1.448 m)  Wt 163 lb 2 oz (73.993 kg)  BMI 35.30 kg/m2  SpO2 96% Physical Exam  VS noted Constitutional: Pt is oriented to person, place, and time. Appears well-developed and well-nourished.  HENT:  Head: Normocephalic and atraumatic.  Right Ear: External ear normal.  Left Ear: External ear normal.  Nose: Nose normal.  Mouth/Throat: Oropharynx is clear and moist.  Eyes: Conjunctivae and EOM are normal. Pupils are equal, round, and reactive to light.  Neck: Normal range of motion. Neck supple. No JVD present. No tracheal deviation present.  Cardiovascular: Normal rate, regular rhythm, normal heart sounds and intact distal pulses.   Pulmonary/Chest: Effort normal and breath sounds normal.  Abdominal: Soft. Bowel sounds are normal. There is no tenderness.  Musculoskeletal: Normal range of motion. Exhibits no edema.  Lymphadenopathy:  Has no cervical adenopathy.  Neurological: Pt is alert and oriented to person, place, and time. Pt has normal reflexes. No cranial nerve deficit.  Skin: Skin is warm and dry. No rash noted.  Psychiatric:  Has  normal mood and affect. Behavior is normal.     Assessment & Plan:

## 2011-12-28 ENCOUNTER — Other Ambulatory Visit: Payer: Self-pay | Admitting: Internal Medicine

## 2012-02-17 ENCOUNTER — Encounter (HOSPITAL_COMMUNITY): Payer: Self-pay | Admitting: Pharmacy Technician

## 2012-02-17 NOTE — Progress Notes (Signed)
LM at # provided with arrival time, NPO, medications to take in AM, phone number to call for any questions.

## 2012-02-18 NOTE — Progress Notes (Signed)
I notified Gwyn at Dr Magdalene Patricia office that we have no orders for tomorrow's surgery.

## 2012-02-18 NOTE — Progress Notes (Signed)
I left a message on voice mail [phone] for change in arrival time to 0530.

## 2012-02-19 ENCOUNTER — Ambulatory Visit (HOSPITAL_COMMUNITY)
Admission: RE | Admit: 2012-02-19 | Discharge: 2012-02-20 | Disposition: A | Discharge status: Home / Self Care | Payer: Medicare Other | Source: Ambulatory Visit | Attending: Orthopedic Surgery | Admitting: Orthopedic Surgery

## 2012-02-19 ENCOUNTER — Ambulatory Visit (HOSPITAL_COMMUNITY): Payer: Medicare Other

## 2012-02-19 ENCOUNTER — Encounter (HOSPITAL_COMMUNITY): Payer: Self-pay | Admitting: *Deleted

## 2012-02-19 ENCOUNTER — Ambulatory Visit (HOSPITAL_COMMUNITY): Payer: Medicare Other | Admitting: Certified Registered Nurse Anesthetist

## 2012-02-19 ENCOUNTER — Encounter (HOSPITAL_COMMUNITY)
Admission: RE | Disposition: A | Discharge status: Home / Self Care | Payer: Self-pay | Source: Ambulatory Visit | Attending: Orthopedic Surgery

## 2012-02-19 ENCOUNTER — Encounter (HOSPITAL_COMMUNITY): Payer: Self-pay | Admitting: Certified Registered Nurse Anesthetist

## 2012-02-19 DIAGNOSIS — K219 Gastro-esophageal reflux disease without esophagitis: Secondary | ICD-10-CM | POA: Insufficient documentation

## 2012-02-19 DIAGNOSIS — E785 Hyperlipidemia, unspecified: Secondary | ICD-10-CM | POA: Insufficient documentation

## 2012-02-19 DIAGNOSIS — W19XXXA Unspecified fall, initial encounter: Secondary | ICD-10-CM | POA: Insufficient documentation

## 2012-02-19 DIAGNOSIS — E119 Type 2 diabetes mellitus without complications: Secondary | ICD-10-CM | POA: Insufficient documentation

## 2012-02-19 DIAGNOSIS — F411 Generalized anxiety disorder: Secondary | ICD-10-CM | POA: Insufficient documentation

## 2012-02-19 DIAGNOSIS — IMO0002 Reserved for concepts with insufficient information to code with codable children: Secondary | ICD-10-CM | POA: Insufficient documentation

## 2012-02-19 DIAGNOSIS — I10 Essential (primary) hypertension: Secondary | ICD-10-CM | POA: Insufficient documentation

## 2012-02-19 DIAGNOSIS — S42409A Unspecified fracture of lower end of unspecified humerus, initial encounter for closed fracture: Secondary | ICD-10-CM | POA: Diagnosis present

## 2012-02-19 HISTORY — PX: TOTAL ELBOW ARTHROPLASTY: SHX812

## 2012-02-19 LAB — GLUCOSE, CAPILLARY
Glucose-Capillary: 127 mg/dL — ABNORMAL HIGH (ref 70–99)
Glucose-Capillary: 158 mg/dL — ABNORMAL HIGH (ref 70–99)
Glucose-Capillary: 175 mg/dL — ABNORMAL HIGH (ref 70–99)

## 2012-02-19 LAB — TYPE AND SCREEN
ABO/RH(D): O POS
Antibody Screen: NEGATIVE

## 2012-02-19 LAB — CBC WITH DIFFERENTIAL/PLATELET
Basophils Absolute: 0 10*3/uL (ref 0.0–0.1)
Basophils Relative: 1 % (ref 0–1)
HCT: 36.9 % (ref 36.0–46.0)
Hemoglobin: 12.1 g/dL (ref 12.0–15.0)
Lymphocytes Relative: 31 % (ref 12–46)
MCHC: 32.8 g/dL (ref 30.0–36.0)
Monocytes Absolute: 0.4 10*3/uL (ref 0.1–1.0)
Monocytes Relative: 7 % (ref 3–12)
Neutro Abs: 3.7 10*3/uL (ref 1.7–7.7)
Neutrophils Relative %: 61 % (ref 43–77)
WBC: 6.1 10*3/uL (ref 4.0–10.5)

## 2012-02-19 LAB — URINALYSIS, ROUTINE W REFLEX MICROSCOPIC
Bilirubin Urine: NEGATIVE
Glucose, UA: NEGATIVE mg/dL
Hgb urine dipstick: NEGATIVE
Specific Gravity, Urine: 1.014 (ref 1.005–1.030)
pH: 6 (ref 5.0–8.0)

## 2012-02-19 LAB — COMPREHENSIVE METABOLIC PANEL
AST: 22 U/L (ref 0–37)
Albumin: 3.9 g/dL (ref 3.5–5.2)
Alkaline Phosphatase: 57 U/L (ref 39–117)
CO2: 24 mEq/L (ref 19–32)
Chloride: 104 mEq/L (ref 96–112)
GFR calc non Af Amer: 87 mL/min — ABNORMAL LOW (ref >90)
Potassium: 3.6 mEq/L (ref 3.5–5.1)
Total Bilirubin: 1.2 mg/dL (ref 0.3–1.2)

## 2012-02-19 LAB — SURGICAL PCR SCREEN: Staphylococcus aureus: NEGATIVE

## 2012-02-19 LAB — ABO/RH: ABO/RH(D): O POS

## 2012-02-19 SURGERY — ARTHROPLASTY, ELBOW, TOTAL
Anesthesia: General | Site: Elbow | Laterality: Left | Wound class: Clean

## 2012-02-19 MED ORDER — GLYCOPYRROLATE 0.2 MG/ML IJ SOLN
INTRAMUSCULAR | Status: DC | PRN
  Administered 2012-02-19: .6 mg via INTRAVENOUS

## 2012-02-19 MED ORDER — ONDANSETRON HCL 4 MG PO TABS: 4.0000 mg | ORAL_TABLET | Freq: Four times a day (QID) | ORAL | Status: DC | PRN

## 2012-02-19 MED ORDER — CLONAZEPAM 0.5 MG PO TABS
0.5000 mg | ORAL_TABLET | Freq: Two times a day (BID) | ORAL | Status: DC
  Administered 2012-02-19 – 2012-02-20 (×3): 0.5 mg via ORAL
  Filled 2012-02-19 (×3): qty 1

## 2012-02-19 MED ORDER — NEOSTIGMINE METHYLSULFATE 1 MG/ML IJ SOLN
INTRAMUSCULAR | Status: DC | PRN
  Administered 2012-02-19: 4 mg via INTRAVENOUS

## 2012-02-19 MED ORDER — INSULIN ASPART 100 UNIT/ML ~~LOC~~ SOLN: 0.0000 [IU] | Freq: Every day | SUBCUTANEOUS | Status: DC

## 2012-02-19 MED ORDER — LACTATED RINGERS IV SOLN
INTRAVENOUS | Status: DC | PRN
  Administered 2012-02-19 (×3): via INTRAVENOUS

## 2012-02-19 MED ORDER — ASPIRIN 81 MG PO CHEW
CHEWABLE_TABLET | ORAL | Status: AC
  Filled 2012-02-19: qty 1

## 2012-02-19 MED ORDER — HYDROCODONE-ACETAMINOPHEN 5-325 MG PO TABS: 1.0000 | ORAL_TABLET | Freq: Four times a day (QID) | ORAL | Status: DC | PRN

## 2012-02-19 MED ORDER — FENTANYL CITRATE 0.05 MG/ML IJ SOLN
INTRAMUSCULAR | Status: DC | PRN
  Administered 2012-02-19 (×2): 50 ug via INTRAVENOUS
  Administered 2012-02-19: 150 ug via INTRAVENOUS

## 2012-02-19 MED ORDER — METOCLOPRAMIDE HCL 5 MG/ML IJ SOLN: 5.0000 mg | Freq: Three times a day (TID) | INTRAMUSCULAR | Status: DC | PRN

## 2012-02-19 MED ORDER — DIPHENHYDRAMINE HCL 12.5 MG/5ML PO ELIX: 12.5000 mg | ORAL_SOLUTION | ORAL | Status: DC | PRN

## 2012-02-19 MED ORDER — MORPHINE SULFATE 2 MG/ML IJ SOLN: 1.0000 mg | INTRAMUSCULAR | Status: DC | PRN

## 2012-02-19 MED ORDER — INSULIN ASPART 100 UNIT/ML ~~LOC~~ SOLN
0.0000 [IU] | Freq: Three times a day (TID) | SUBCUTANEOUS | Status: DC
  Administered 2012-02-20: 3 [IU] via SUBCUTANEOUS
  Administered 2012-02-20: 2 [IU] via SUBCUTANEOUS

## 2012-02-19 MED ORDER — ROCURONIUM BROMIDE 100 MG/10ML IV SOLN
INTRAVENOUS | Status: DC | PRN
  Administered 2012-02-19: 50 mg via INTRAVENOUS
  Administered 2012-02-19: 10 mg via INTRAVENOUS

## 2012-02-19 MED ORDER — MUPIROCIN 2 % EX OINT
TOPICAL_OINTMENT | CUTANEOUS | Status: AC
  Administered 2012-02-19: 1 via NASAL
  Filled 2012-02-19: qty 22

## 2012-02-19 MED ORDER — DOCUSATE SODIUM 100 MG PO CAPS
100.0000 mg | ORAL_CAPSULE | Freq: Two times a day (BID) | ORAL | Status: DC
  Administered 2012-02-19 – 2012-02-20 (×3): 100 mg via ORAL
  Filled 2012-02-19 (×3): qty 1

## 2012-02-19 MED ORDER — CEFAZOLIN SODIUM-DEXTROSE 2-3 GM-% IV SOLR
2.0000 g | INTRAVENOUS | Status: AC
  Administered 2012-02-19: 2 g via INTRAVENOUS

## 2012-02-19 MED ORDER — LACTATED RINGERS IV SOLN: INTRAVENOUS | Status: DC

## 2012-02-19 MED ORDER — METHOCARBAMOL 500 MG PO TABS
500.0000 mg | ORAL_TABLET | Freq: Four times a day (QID) | ORAL | Status: DC | PRN
  Administered 2012-02-19 – 2012-02-20 (×3): 500 mg via ORAL
  Filled 2012-02-19 (×3): qty 1

## 2012-02-19 MED ORDER — OXYCODONE HCL 5 MG PO TABS: 5.0000 mg | ORAL_TABLET | Freq: Four times a day (QID) | ORAL | Status: AC | PRN

## 2012-02-19 MED ORDER — CEFAZOLIN SODIUM 1-5 GM-% IV SOLN
1.0000 g | Freq: Three times a day (TID) | INTRAVENOUS | Status: DC
  Administered 2012-02-19 – 2012-02-20 (×2): 1 g via INTRAVENOUS
  Filled 2012-02-19 (×4): qty 50

## 2012-02-19 MED ORDER — HYDROMORPHONE HCL PF 1 MG/ML IJ SOLN
INTRAMUSCULAR | Status: AC
  Filled 2012-02-19: qty 1

## 2012-02-19 MED ORDER — OXYCODONE HCL 5 MG PO TABS
5.0000 mg | ORAL_TABLET | ORAL | Status: DC | PRN
  Administered 2012-02-19 – 2012-02-20 (×4): 10 mg via ORAL
  Filled 2012-02-19 (×4): qty 2

## 2012-02-19 MED ORDER — ASPIRIN EC 81 MG PO TBEC
81.0000 mg | DELAYED_RELEASE_TABLET | Freq: Every day | ORAL | Status: DC
  Administered 2012-02-20: 81 mg via ORAL
  Filled 2012-02-19 (×2): qty 1

## 2012-02-19 MED ORDER — ONDANSETRON HCL 4 MG/2ML IJ SOLN: 4.0000 mg | Freq: Four times a day (QID) | INTRAMUSCULAR | Status: DC | PRN

## 2012-02-19 MED ORDER — CEFAZOLIN SODIUM-DEXTROSE 2-3 GM-% IV SOLR
INTRAVENOUS | Status: AC
  Filled 2012-02-19: qty 50

## 2012-02-19 MED ORDER — ACETAMINOPHEN 500 MG PO TABS: 500.0000 mg | ORAL_TABLET | Freq: Four times a day (QID) | ORAL | Status: DC | PRN

## 2012-02-19 MED ORDER — PROPOFOL 10 MG/ML IV BOLUS
INTRAVENOUS | Status: DC | PRN
  Administered 2012-02-19: 150 mg via INTRAVENOUS

## 2012-02-19 MED ORDER — HYDROCODONE-ACETAMINOPHEN 7.5-325 MG PO TABS
1.0000 | ORAL_TABLET | ORAL | Status: DC | PRN
  Administered 2012-02-19: 2 via ORAL
  Administered 2012-02-19: 1 via ORAL
  Filled 2012-02-19: qty 1
  Filled 2012-02-19: qty 2

## 2012-02-19 MED ORDER — MIDAZOLAM HCL 5 MG/5ML IJ SOLN
INTRAMUSCULAR | Status: DC | PRN
  Administered 2012-02-19: 1 mg via INTRAVENOUS

## 2012-02-19 MED ORDER — 0.9 % SODIUM CHLORIDE (POUR BTL) OPTIME
TOPICAL | Status: DC | PRN
  Administered 2012-02-19: 1000 mL

## 2012-02-19 MED ORDER — LIDOCAINE HCL (CARDIAC) 20 MG/ML IV SOLN
INTRAVENOUS | Status: DC | PRN
  Administered 2012-02-19: 80 mg via INTRAVENOUS

## 2012-02-19 MED ORDER — MUPIROCIN 2 % EX OINT
TOPICAL_OINTMENT | Freq: Two times a day (BID) | CUTANEOUS | Status: DC
  Administered 2012-02-19 – 2012-02-20 (×2): via NASAL
  Filled 2012-02-19: qty 22

## 2012-02-19 MED ORDER — METOCLOPRAMIDE HCL 10 MG PO TABS: 5.0000 mg | ORAL_TABLET | Freq: Three times a day (TID) | ORAL | Status: DC | PRN

## 2012-02-19 MED ORDER — ONDANSETRON HCL 4 MG/2ML IJ SOLN
INTRAMUSCULAR | Status: DC | PRN
  Administered 2012-02-19: 4 mg via INTRAVENOUS

## 2012-02-19 MED ORDER — ASPIRIN 81 MG PO CHEW
CHEWABLE_TABLET | ORAL | Status: AC
  Administered 2012-02-19: 81 mg
  Filled 2012-02-19: qty 1

## 2012-02-19 MED ORDER — METHOCARBAMOL 100 MG/ML IJ SOLN
500.0000 mg | Freq: Four times a day (QID) | INTRAVENOUS | Status: DC | PRN
  Filled 2012-02-19: qty 5

## 2012-02-19 MED ORDER — POTASSIUM CHLORIDE IN NACL 20-0.9 MEQ/L-% IV SOLN
INTRAVENOUS | Status: DC
  Administered 2012-02-19: 16:00:00 via INTRAVENOUS
  Filled 2012-02-19 (×2): qty 1000

## 2012-02-19 MED ORDER — ATORVASTATIN CALCIUM 20 MG PO TABS
20.0000 mg | ORAL_TABLET | Freq: Every day | ORAL | Status: DC
  Filled 2012-02-19 (×2): qty 1

## 2012-02-19 MED ORDER — HYDROMORPHONE HCL PF 1 MG/ML IJ SOLN
0.2500 mg | INTRAMUSCULAR | Status: DC | PRN
  Administered 2012-02-19 (×2): 0.5 mg via INTRAVENOUS

## 2012-02-19 MED ORDER — EPHEDRINE SULFATE 50 MG/ML IJ SOLN
INTRAMUSCULAR | Status: DC | PRN
  Administered 2012-02-19: 15 mg via INTRAVENOUS

## 2012-02-19 SURGICAL SUPPLY — 88 items
APL SKNCLS STERI-STRIP NONHPOA (GAUZE/BANDAGES/DRESSINGS) ×1
BANDAGE ELASTIC 3 VELCRO ST LF (GAUZE/BANDAGES/DRESSINGS) ×1 IMPLANT
BANDAGE ELASTIC 4 VELCRO ST LF (GAUZE/BANDAGES/DRESSINGS) ×2 IMPLANT
BANDAGE ELASTIC 6 VELCRO ST LF (GAUZE/BANDAGES/DRESSINGS) ×1 IMPLANT
BANDAGE GAUZE ELAST BULKY 4 IN (GAUZE/BANDAGES/DRESSINGS) ×1 IMPLANT
BENZOIN TINCTURE PRP APPL 2/3 (GAUZE/BANDAGES/DRESSINGS) ×1 IMPLANT
BLADE SAW SGTL NAR THIN XSHT (BLADE) ×1 IMPLANT
BLADE SURG 10 STRL SS (BLADE) IMPLANT
BLADE SURG ROTATE 9660 (MISCELLANEOUS) ×2 IMPLANT
BNDG CMPR 9X4 STRL LF SNTH (GAUZE/BANDAGES/DRESSINGS) ×1
BNDG COHESIVE 4X5 TAN STRL (GAUZE/BANDAGES/DRESSINGS) ×2 IMPLANT
BNDG ESMARK 4X9 LF (GAUZE/BANDAGES/DRESSINGS) ×2 IMPLANT
BONE CEMENT PALACOS W/GENTAMIC (Orthopedic Implant) ×2 IMPLANT
BRUSH SCRUB DISP (MISCELLANEOUS) ×3 IMPLANT
BUR ROUND FLUTED 4 SOFT TCH (BURR) IMPLANT
BUR ROUND FLUTED 5 RND (BURR) ×1 IMPLANT
CEMENT FEMORAL STRL SM 6MM (Cement) ×1 IMPLANT
CEMENT PLUG FEMORAL STRL 8MM (Orthopedic Implant) ×1 IMPLANT
CLEANER TIP ELECTROSURG 2X2 (MISCELLANEOUS) ×2 IMPLANT
CLOTH BEACON ORANGE TIMEOUT ST (SAFETY) ×2 IMPLANT
CLSR STERI-STRIP ANTIMIC 1/2X4 (GAUZE/BANDAGES/DRESSINGS) ×1 IMPLANT
CORDS BIPOLAR (ELECTRODE) ×1 IMPLANT
COVER SURGICAL LIGHT HANDLE (MISCELLANEOUS) ×4 IMPLANT
CUFF TOURNIQUET SINGLE 18IN (TOURNIQUET CUFF) ×1 IMPLANT
CUFF TOURNIQUET SINGLE 24IN (TOURNIQUET CUFF) ×1 IMPLANT
DRAIN PENROSE 1/4X12 LTX STRL (WOUND CARE) ×1 IMPLANT
DRAPE C-ARM 42X72 X-RAY (DRAPES) ×1 IMPLANT
DRAPE C-ARMOR (DRAPES) ×1 IMPLANT
DRAPE INCISE IOBAN 66X45 STRL (DRAPES) ×1 IMPLANT
DRAPE U-SHAPE 47X51 STRL (DRAPES) ×2 IMPLANT
ELECT REM PT RETURN 9FT ADLT (ELECTROSURGICAL) ×2
ELECTRODE REM PT RTRN 9FT ADLT (ELECTROSURGICAL) ×1 IMPLANT
EVACUATOR 1/8 PVC DRAIN (DRAIN) IMPLANT
FACESHIELD LNG OPTICON STERILE (SAFETY) IMPLANT
GAUZE XEROFORM 1X8 LF (GAUZE/BANDAGES/DRESSINGS) ×1 IMPLANT
GAUZE XEROFORM 5X9 LF (GAUZE/BANDAGES/DRESSINGS) ×1 IMPLANT
GLOVE BIO SURGEON STRL SZ 6.5 (GLOVE) ×1 IMPLANT
GLOVE BIO SURGEON STRL SZ7.5 (GLOVE) ×2 IMPLANT
GLOVE BIO SURGEON STRL SZ8 (GLOVE) ×2 IMPLANT
GLOVE BIOGEL PI IND STRL 6.5 (GLOVE) IMPLANT
GLOVE BIOGEL PI IND STRL 7.5 (GLOVE) ×1 IMPLANT
GLOVE BIOGEL PI IND STRL 8 (GLOVE) ×1 IMPLANT
GLOVE BIOGEL PI INDICATOR 6.5 (GLOVE) ×1
GLOVE BIOGEL PI INDICATOR 7.5 (GLOVE) ×1
GLOVE BIOGEL PI INDICATOR 8 (GLOVE) ×1
GLOVE SURG SS PI 6.5 STRL IVOR (GLOVE) ×2 IMPLANT
GOWN PREVENTION PLUS XLARGE (GOWN DISPOSABLE) ×2 IMPLANT
GOWN STRL NON-REIN LRG LVL3 (GOWN DISPOSABLE) ×5 IMPLANT
HANDPIECE INTERPULSE COAX TIP (DISPOSABLE) ×2
HUMERAL CON SET-HEXALOBULAR (Orthopedic Implant) ×2 IMPLANT
HUMERAL LEFT 4MMX100MM (Orthopedic Implant) ×1 IMPLANT
KIT BASIN OR (CUSTOM PROCEDURE TRAY) ×2 IMPLANT
KIT DISC CONDYLE HEXALOBULAR (Orthopedic Implant) IMPLANT
KIT ROOM TURNOVER OR (KITS) ×2 IMPLANT
LOOP VESSEL MAXI BLUE (MISCELLANEOUS) IMPLANT
MANIFOLD NEPTUNE II (INSTRUMENTS) ×2 IMPLANT
NOZZLE CEMENT SMALL (Cement) ×2 IMPLANT
NS IRRIG 1000ML POUR BTL (IV SOLUTION) ×2 IMPLANT
PACK ORTHO EXTREMITY (CUSTOM PROCEDURE TRAY) ×2 IMPLANT
PAD ARMBOARD 7.5X6 YLW CONV (MISCELLANEOUS) ×4 IMPLANT
PAD CAST 4YDX4 CTTN HI CHSV (CAST SUPPLIES) ×1 IMPLANT
PADDING CAST COTTON 4X4 STRL (CAST SUPPLIES)
PLUG CEMENT FEMORAL STRL 10MM (Orthopedic Implant) ×1 IMPLANT
SET HNDPC FAN SPRY TIP SCT (DISPOSABLE) ×1 IMPLANT
SLING ARM FOAM STRAP MED (SOFTGOODS) ×1 IMPLANT
SPONGE GAUZE 4X4 12PLY (GAUZE/BANDAGES/DRESSINGS) ×1 IMPLANT
SPONGE LAP 18X18 X RAY DECT (DISPOSABLE) ×4 IMPLANT
SPONGE SCRUB IODOPHOR (GAUZE/BANDAGES/DRESSINGS) ×1 IMPLANT
STAPLER VISISTAT 35W (STAPLE) ×1 IMPLANT
STRIP CLOSURE SKIN 1/2X4 (GAUZE/BANDAGES/DRESSINGS) IMPLANT
SUCTION FRAZIER TIP 10 FR DISP (SUCTIONS) ×1 IMPLANT
SUT FIBERWIRE #2 38 REV NDL BL (SUTURE)
SUT PROLENE 3 0 PS 2 (SUTURE) ×4 IMPLANT
SUT VIC AB 0 CT1 27 (SUTURE) ×2
SUT VIC AB 0 CT1 27XBRD ANBCTR (SUTURE) ×2 IMPLANT
SUT VIC AB 2-0 CT1 27 (SUTURE) ×4
SUT VIC AB 2-0 CT1 TAPERPNT 27 (SUTURE) ×2 IMPLANT
SUT VIC AB 2-0 CT3 27 (SUTURE) ×2 IMPLANT
SUTURE FIBERWR#2 38 REV NDL BL (SUTURE) IMPLANT
SYR CONTROL 10ML LL (SYRINGE) ×1 IMPLANT
TOWEL OR 17X24 6PK STRL BLUE (TOWEL DISPOSABLE) ×1 IMPLANT
TOWEL OR 17X26 10 PK STRL BLUE (TOWEL DISPOSABLE) ×4 IMPLANT
TRAY FOLEY CATH 14FR (SET/KITS/TRAYS/PACK) ×2 IMPLANT
TUBE CONNECTING 12X1/4 (SUCTIONS) ×2 IMPLANT
ULNA LEFT 3MMX75MM (Orthopedic Implant) ×1 IMPLANT
UNDERPAD 30X30 INCONTINENT (UNDERPADS AND DIAPERS) ×2 IMPLANT
WATER STERILE IRR 1000ML POUR (IV SOLUTION) ×2 IMPLANT
YANKAUER SUCT BULB TIP NO VENT (SUCTIONS) ×2 IMPLANT

## 2012-02-19 NOTE — Anesthesia Preprocedure Evaluation (Addendum)
Anesthesia Evaluation  Patient identified by MRN, date of birth, ID band Patient awake    Reviewed: Allergy & Precautions, H&P , NPO status   Airway Mallampati: II TM Distance: >3 FB Neck ROM: Full    Dental  (+) Dental Advisory Given   Pulmonary  breath sounds clear to auscultation        Cardiovascular hypertension, Pt. on medications Rhythm:Regular Rate:Normal     Neuro/Psych PSYCHIATRIC DISORDERS Anxiety Depression    GI/Hepatic Neg liver ROS, GERD-  Medicated and Controlled,Gastroparesis    Endo/Other  Type 2, Oral Hypoglycemic Agents  Renal/GU negative Renal ROS     Musculoskeletal   Abdominal   Peds  Hematology   Anesthesia Other Findings Fatty Liver Disease Vertigo, Chronic  Reproductive/Obstetrics                      Anesthesia Physical Anesthesia Plan  ASA: III  Anesthesia Plan: General   Post-op Pain Management:    Induction: Intravenous  Airway Management Planned: Oral ETT  Additional Equipment:   Intra-op Plan:   Post-operative Plan: Extubation in OR  Informed Consent: I have reviewed the patients History and Physical, chart, labs and discussed the procedure including the risks, benefits and alternatives for the proposed anesthesia with the patient or authorized representative who has indicated his/her understanding and acceptance.   Dental advisory given  Plan Discussed with: CRNA, Anesthesiologist and Surgeon  Anesthesia Plan Comments:        Anesthesia Quick Evaluation

## 2012-02-19 NOTE — Progress Notes (Signed)
Orthopedic Tech Progress Note Patient Details:  Latasha Wang [birth year] [id number] Applied overhead frame and trapeze bar.     [name] 02/19/2012, 4:02 PM

## 2012-02-19 NOTE — Preoperative (Signed)
Beta Blockers   Reason not to administer Beta Blockers:Not Applicable 

## 2012-02-19 NOTE — Anesthesia Procedure Notes (Signed)
Procedure Name: Intubation Date/Time: 02/19/2012 8:17 AM Performed by: Rogelia Boga Pre-anesthesia Checklist: Patient identified, Emergency Drugs available, Suction available, Patient being monitored and Timeout performed Patient Re-evaluated:Patient Re-evaluated prior to inductionOxygen Delivery Method: Circle system utilized Preoxygenation: Pre-oxygenation with 100% oxygen Intubation Type: IV induction Ventilation: Mask ventilation without difficulty and Oral airway inserted - appropriate to patient size Laryngoscope Size: Mac and 4 Grade View: Grade I Tube type: Oral Tube size: 7.5 mm Number of attempts: 1 Airway Equipment and Method: Stylet Placement Confirmation: ETT inserted through vocal cords under direct vision,  positive ETCO2 and breath sounds checked- equal and bilateral Secured at: 21 cm Tube secured with: Tape Dental Injury: Teeth and Oropharynx as per pre-operative assessment

## 2012-02-19 NOTE — H&P (Signed)
[facility]  CC: fall with L elbow pain  HPI:   71 y/o RHD female s/p fall with L distal humerus fracture.  Pt presented as an outpatient for eval. Xrays were reviewed and options discussed.  Surgical management was felt to be appropriate.  Total elbow arthroplasty is most appropriate given pts low demand  Past Medical History  Diagnosis Date  . ANXIETY 06/20/2008  . DEPRESSION 05/09/2007  . FATTY LIVER DISEASE 09/01/2007  . Gastroparesis 09/01/2007  . GERD 05/09/2007  . HYPERLIPIDEMIA 07/05/2007  . HYPERTENSION 05/09/2007  . LATERAL EPICONDYLITIS, RIGHT 12/17/2008  . LIVER FUNCTION TESTS, ABNORMAL 07/19/2007  . MYALGIA 05/09/2007  . Other malaise and fatigue 07/05/2007  . VERTIGO, CHRONIC 09/01/2007  . Diabetes mellitus    Past Surgical History  Procedure Date  . Cataract extraction, bilateral    History  Substance Use Topics  . Smoking status: Never Smoker   . Smokeless tobacco: Not on file  . Alcohol Use: No   History reviewed. No pertinent family history.  No Known Allergies  Medications Prior to Admission  Medication Sig Dispense Refill  . amLODipine (NORVASC) 10 MG tablet Take 10 mg by mouth daily.      Marland Kitchen aspirin EC 81 MG tablet Take 81 mg by mouth daily.      . clonazePAM (KLONOPIN) 0.5 MG tablet Take 0.5 mg by mouth 2 (two) times daily.      Marland Kitchen HYDROcodone-acetaminophen (NORCO) 5-325 MG per tablet Take 1 tablet by mouth every 6 (six) hours as needed. For pain      . meclizine (ANTIVERT) 12.5 MG tablet Take 12.5-25 mg by mouth every 6 (six) hours as needed. For dizziness      . metFORMIN (GLUMETZA) 500 MG (MOD) 24 hr tablet Take 500 mg by mouth daily with breakfast.      . pioglitazone (ACTOS) 45 MG tablet Take 45 mg by mouth daily.      . rosuvastatin (CRESTOR) 10 MG tablet Take 10 mg by mouth daily.      . valsartan (DIOVAN) 320 MG tablet Take 320 mg by mouth daily.      Marland Kitchen acetaminophen (TYLENOL) 500 MG tablet Take 1,000 mg by mouth every 6 (six)  hours as needed. For pain        Review of Systems  Musculoskeletal:       L elbow pain    Physical exam  BP 144/81  Pulse 76  Temp 99.1 F (37.3 C) (Oral)  Resp 18  SpO2 96%   Physical Exam  Constitutional: Vital signs are normal. She is cooperative. No distress.  Cardiovascular: S1 normal and S2 normal.   Pulmonary/Chest: Effort normal. No respiratory distress.  Abdominal: Soft. Bowel sounds are normal.  Musculoskeletal:       Left Upper extremity   + swelling   + fx blister medial elbow    Motor and sensory functions intact distally    Ext is warm with + radial pulse    Compartments soft and NT  Neurological: She is alert.    xrays      Comminuted intra-articular L distal humerus fracture  A/P  71 y/o RHD female with complex L distal humerus fx  OR for L total elbow arthroplasty Admit for pain control and therapies Lifetime lifting restriction of 5 lbs  Manage medical issues accordingly   Mearl Latin, PA-C [facility] [phone] (P) 02/19/2012 7:07 AM

## 2012-02-19 NOTE — Anesthesia Postprocedure Evaluation (Signed)
  Anesthesia Post-op Note  Patient: Latasha Wang  Procedure(s) Performed: Procedure(s) (LRB): TOTAL ELBOW ARTHROPLASTY (Left)  Patient Location: PACU  Anesthesia Type: General  Level of Consciousness: awake  Airway and Oxygen Therapy: Patient Spontanous Breathing  Post-op Pain: mild  Post-op Assessment: Post-op Vital signs reviewed  Post-op Vital Signs: Reviewed  Complications: No apparent anesthesia complications

## 2012-02-19 NOTE — Transfer of Care (Signed)
Immediate Anesthesia Transfer of Care Note  Patient: Latasha Wang  Procedure(s) Performed: Procedure(s) (LRB): TOTAL ELBOW ARTHROPLASTY (Left)  Patient Location: PACU  Anesthesia Type: General  Level of Consciousness: awake, alert , oriented and patient cooperative  Airway & Oxygen Therapy: Patient Spontanous Breathing and Patient connected to nasal cannula oxygen  Post-op Assessment: Report given to PACU RN and Post -op Vital signs reviewed and stable  Post vital signs: Reviewed and stable  Complications: No apparent anesthesia complications

## 2012-02-19 NOTE — Brief Op Note (Signed)
02/19/2012  11:45 AM  PATIENT:  Latasha Wang  71 y.o. female  PRE-OPERATIVE DIAGNOSIS:  comminuted left distal humerus, supracondylar  POST-OPERATIVE DIAGNOSIS:   comminuted left distal humerus, supracondylar  PROCEDURE:  Procedure(s) (LRB): TOTAL ELBOW ARTHROPLASTY (Left), Biomet #4 humerus, #3 ulna  SURGEON:  Surgeon(s) and Role:    * Budd Palmer, MD - Primary  PHYSICIAN ASSISTANT: Montez Morita, [facility]  ANESTHESIA:   general  EBL:  Total I/O In: 1000 [I.V.:1000] Out: 1000 [Urine:1000]  BLOOD ADMINISTERED:none  DRAINS: none   LOCAL MEDICATIONS USED:  NONE  SPECIMEN:  No Specimen  DISPOSITION OF SPECIMEN:  N/A  COUNTS:  YES  TOURNIQUET:   Total Tourniquet Time Documented: Upper Arm (Left) - 184 minutes  DICTATION: .Other Dictation: Dictation Number [id number]  PLAN OF CARE: Admit to inpatient   PATIENT DISPOSITION:  PACU - hemodynamically stable.   Delay start of Pharmacological VTE agent (>24hrs) due to surgical blood loss or risk of bleeding: yes

## 2012-02-20 LAB — GLUCOSE, CAPILLARY
Glucose-Capillary: 141 mg/dL — ABNORMAL HIGH (ref 70–99)
Glucose-Capillary: 163 mg/dL — ABNORMAL HIGH (ref 70–99)

## 2012-02-20 LAB — CBC
Platelets: 216 10*3/uL (ref 150–400)
RBC: 3.65 MIL/uL — ABNORMAL LOW (ref 3.87–5.11)
WBC: 8 10*3/uL (ref 4.0–10.5)

## 2012-02-20 MED ORDER — MECLIZINE HCL 12.5 MG PO TABS
12.5000 mg | ORAL_TABLET | Freq: Three times a day (TID) | ORAL | Status: DC
  Administered 2012-02-20: 12.5 mg via ORAL
  Filled 2012-02-20 (×3): qty 1

## 2012-02-20 MED ORDER — MECLIZINE HCL 12.5 MG PO TABS: 12.5000 mg | ORAL_TABLET | Freq: Four times a day (QID) | ORAL | Status: DC | PRN

## 2012-02-20 MED ORDER — CEFAZOLIN SODIUM 1-5 GM-% IV SOLN
1.0000 g | Freq: Once | INTRAVENOUS | Status: AC
  Administered 2012-02-20: 1 g via INTRAVENOUS
  Filled 2012-02-20: qty 50

## 2012-02-20 NOTE — Op Note (Signed)
Latasha Wang, Latasha Wang NO.:  [id number]  MEDICAL RECORD NO.:  [id number]  LOCATION:  [id number]                        FACILITY:  [facility]  PHYSICIAN:  Doralee Albino. Carola Frost, M.D. DATE OF BIRTH:  14-Dec-1940  DATE OF PROCEDURE:  02/19/2012 DATE OF DISCHARGE:                              OPERATIVE REPORT   PREOPERATIVE DIAGNOSIS:  Comminuted left distal humerus supracondylar fracture with intercondylar extension.  POSTOPERATIVE DIAGNOSIS:  Comminuted left distal humerus supracondylar fracture with intercondylar extension.  PROCEDURE:  Left total elbow arthroplasty using a Biomet #4 humeral stem and a #3 ulna.  SURGEON:  Doralee Albino. Carola Frost, MD  ASSISTING:  Mearl Latin, PA-C  ANESTHESIA:  General.  COMPLICATIONS:  None.  IV FLUIDS:  1000 mL of crystalloid.  URINARY OUTPUT:  1000 mL.  TOURNIQUET TIME:  2 hours and 22 minutes.  DISPOSITION:  To PACU.  CONDITION:  Stable.  BRIEF SUMMARY AND INDICATION FOR PROCEDURE:  Latasha Wang is a very pleasant 71 year old right-hand dominant female who sustained a comminuted left distal humerus fracture with numerous fracture fragments and at least seven pieces, all below the trochlea.  It was not reconstructible given her bone quality.  I did discuss with her and her daughter attempted fixation versus total elbow arthroplasty versus nonsurgical management and recommended total elbow arthroplasty, which the patient and daughter agree with hardly.  The risks and benefits of surgery were discussed with both including 5-pound lifting restriction for life, loss of motion, nerve injury, vessel injury, loosening, and need for further surgery, and multiple others and they did wish to proceed.  BRIEF DESCRIPTION OF PROCEDURE:  The patient was taken to the operating room where general anesthesia was induced.  She did receive preoperative antibiotics.  Her left upper extremity was prepped and draped in usual sterile fashion.  A  posterior incision was made keeping this remote from a large bulla, which was kept intact until the time of surgery to preserve sterility.  Dissection was carried down to the ulnar nerve, which was identified and dissected free distally and proximally.  An arthrotomy was then performed along the ulnar side as well as a smaller Kocher-type approach on the lateral side for removal of epicondylar fragments.  There was a coronal shear of the trochlea and capitellum as well as multiple fractures involving the stool and both the medial and lateral epicondyles.  After removal of all these fragments, wounds were copiously irrigated.  Preparation began with the ulna, which was tilted posteriorly for preparation with reaming and then sequential broaching up to a 4 on the ulna, then used rongeur and bur to broach up to a 3. The canal was only measured a 3 and was quite tight distally.  On the near side, bur was used to remove much of the proximal bone to facilitate insertion.  These were irrigated in pulsatile fashion with bulb syringe and then narrow-tip suction used to remove the liquid, that were then packed with sponges.  Low viscosity cement was used after first trialing the elbow with trial components.  This showed full extension, excellent fit, outstanding stability, and this was confirmed on x-ray with a good position of the stems within  the centers of the canal.  Using standard pressurization and cement technique, the humerus was fixed first followed by the ulna and then engagement of the mobile bearing.  The cement was allowed to harden, which because of its low viscosity did take a considerable period of time and this accounted for the extended time on tourniquet.  After hardening, tourniquet was deflated.  C-arm pictures confirmed excellent positioning.  The wound was then closed in standard layered fashion using 0 Vicryl around knee arthrotomy, 2-0 Vicryl and 3-0 nylon with sterile  gently compressive dressing and posterior splint.  The patient was then taken to the PACU in stable condition.  Montez Morita, PA-C assisted me throughout, and was absolutely necessary for the case's safe and effective completion as the ulna just like the femur in a total hip past to be positioned and held to accommodate preparation with reaming and broaching and then assistance is required for reduction with the mobile bearings as well. He did assist me for the wound closure also.  PROGNOSIS:  The patient will be in a splint for the next day or two, after which, this would be removed and initiate early range of motion. We will plan to see her back in the office next week for a wound check and she will of course have the lifting restriction.     Doralee Albino. Carola Frost, M.D.     [facility]/MEDQ  D:  02/19/2012  T:  02/20/2012  Job:  [id number]

## 2012-02-20 NOTE — Progress Notes (Addendum)
Occupational Therapy Treatment Patient Details Name: Latasha Wang MRN: [id number] DOB: [birth year] Today's Date: 02/20/2012 Time: 4098-1191 OT Time Calculation (min): 30 min  OT Assessment / Plan / Recommendation Comments on Treatment Session Pt/family educated on compensatory tecniques for ADL, including use of sling and donning/doffing sling. Pt verbalizes understanding of moving shoulder withint pain tolerance to maintain shoulder ROM. Pt began with squeeze ball ex and elevation with ice to help decrease edema in L hand. Will see again if schedule allows. Pt will need HH services. Pt's daughter able to provide adequate supervision of pt at D/C. Feel that pt's mobility will improve after she recieves her vertigo medication.    Follow Up Recommendations  Home health OT    Barriers to Discharge  None    Equipment Recommendations  Tub/shower bench    Recommendations for Other Services PT consult  Frequency Min 2X/week   Plan Discharge plan remains appropriate    Precautions / Restrictions Precautions Precautions: Fall;Other (comment) Required Braces or Orthoses: Other Brace/Splint Restrictions LUE Weight Bearing: Non weight bearing   Pertinent Vitals/Pain 4    ADL  Eating/Feeding: Simulated;Set up Where Assessed - Eating/Feeding: Chair Grooming: Performed;Minimal assistance Where Assessed - Grooming: Supported standing Upper Body Bathing: Simulated;Minimal assistance Where Assessed - Upper Body Bathing: Supported sit to stand Lower Body Bathing: Simulated;Minimal assistance Where Assessed - Lower Body Bathing: Supported sit to stand Upper Body Dressing: Simulated;Moderate assistance Where Assessed - Upper Body Dressing: Unsupported sitting Lower Body Dressing: Simulated;Minimal assistance Where Assessed - Lower Body Dressing: Supported sit to stand Toilet Transfer: Performed;Min guard Statistician Method: Sit to stand;Stand pivot Acupuncturist: Comfort  height toilet Toileting - Clothing Manipulation and Hygiene: Performed;Minimal assistance Where Assessed - Engineer, mining and Hygiene: Standing Equipment Used: Gait belt Transfers/Ambulation Related to ADLs: min guard ADL Comments: Pt/family educated on compensatory techniques for bathing and dressing. Educated pt that tub bench would be safest option once she is allowed to shower.    OT Diagnosis: Generalized weakness;Acute pain  OT Problem List: Decreased strength;Decreased range of motion;Decreased activity tolerance;Impaired balance (sitting and/or standing);Decreased knowledge of use of DME or AE;Decreased knowledge of precautions;Impaired UE functional use;Obesity;Pain;Increased edema OT Treatment Interventions: Self-care/ADL training;Therapeutic exercise;Therapeutic activities;Patient/family education   OT Goals Acute Rehab OT Goals OT Goal Formulation: With patient Time For Goal Achievement: 02/27/12 Potential to Achieve Goals: Good ADL Goals Pt Will Perform Upper Body Bathing: with caregiver independent in assisting;with supervision;Sitting, chair;Unsupported ADL Goal: Upper Body Bathing - Progress: Met Pt Will Perform Upper Body Dressing: with supervision;with caregiver independent in assisting;Sitting, chair;Unsupported ADL Goal: Upper Body Dressing - Progress: Progressing toward goals Additional ADL Goal #1: Pts' family will be independent in donning/doffing sling. ADL Goal: Additional Goal #1 - Progress: Progressing toward goals Arm Goals Pt Will Perform AROM: Independently;Left upper extremity;to maintain range of motion;Other (comment) Arm Goal: AROM - Progress: Progressing toward goal Additional Arm Goal #1: Pt/family independent with use of edema control techniques, including elevation and ice. Arm Goal: Additional Goal #1 - Progress: Progressing toward goals  Visit Information  Last OT Received On: 02/20/12    Subjective Data      Prior  Functioning  Home Living Lives With: Family Available Help at Discharge: Available 24 hours/day Type of Home: House Home Access: Stairs to enter [employer] of Steps: 4 Entrance Stairs-Rails: Right;Left;Can reach both Home Layout: Two level;Bed/bath upstairs;1/2 bath on main level Alternate Level Stairs-Number of Steps: 14 Alternate Level Stairs-Rails: Right;Left;Can reach both Bathroom Shower/Tub: Tub/shower  unit Bathroom Toilet: Standard Bathroom Accessibility: Yes How Accessible: Accessible via walker Home Adaptive Equipment: None Prior Function Level of Independence: Independent Able to Take Stairs?: Yes Driving: Yes Vocation: Retired Musician: No difficulties Dominant Hand: Right    Cognition  Overall Cognitive Status: Appears within functional limits for tasks assessed/performed Arousal/Alertness: Lethargic Orientation Level: Appears intact for tasks assessed Behavior During Session: [facility] for tasks performed    Mobility  Shoulder Instructions Bed Mobility Bed Mobility: Not assessed Transfers Transfers: Sit to Stand;Stand to Sit Sit to Stand: From chair/3-in-1;4: Min guard;With upper extremity assist Stand to Sit: 4: Min guard;To chair/3-in-1 Details for Transfer Assistance: c/o being "dizzy"       Exercises  Shoulder Exercises Pendulum Exercise: AAROM;Left;10 reps;Seated Shoulder ABduction: AAROM;Left;10 reps;Seated Digit Composite Flexion: AROM;Left;10 reps;AAROM;Squeeze ball Composite Extension: AROM;AAROM;Left;Seated Neck Flexion: AROM;10 reps;Seated Neck Lateral Flexion - Right: AROM;10 reps;Seated Neck Lateral Flexion - Left: AROM;10 reps;Seated   Balance     End of Session OT - End of Session Equipment Utilized During Treatment: Gait belt Activity Tolerance: Patient tolerated treatment well Patient left: in chair;with call bell/phone within reach;with family/visitor present Nurse Communication: Patient requests pain  meds;Other (comment) (need for vertigo meds)  GO Functional Assessment Tool Used: clinical judgement Functional Limitation: Self care Self Care Current Status (Z6109): At least 40 percent but less than 60 percent impaired, limited or restricted Self Care Goal Status (U0454): At least 40 percent but less than 60 percent impaired, limited or restricted   Gael Delude,HILLARY 02/20/2012, 12:54 PM [facility], OTR/L  [phone] 02/20/2012

## 2012-02-20 NOTE — Progress Notes (Signed)
02/20/2012 1700 Spoke to Nathaniel Man # [phone]. States she request [facility]. NCM will check with [facility] for start of care for [facility] PT/OT and follow up with dtr on 9/1. Requesting tub bench. Spoke to [facility] DME rep, and tub bench is an out of pocket item. Also cane is out of pocket and not covered by insurance. Will notify dtr tomorrow on [facility] agency for PT/OT. Isidoro Donning RN CCM Case Mgmt phone [phone]

## 2012-02-20 NOTE — Progress Notes (Signed)
Occupational Therapy Evaluation Patient Details Name: Latasha Wang MRN: [id number] DOB: [birth year] Today's Date: 02/20/2012 Time: 1015-1030 OT Time Calculation (min): 15 min  OT Assessment / Plan / Recommendation Clinical Impression  71 yo s/p fall with resulting comminuted L distal humeral fracture.Pt underwent L elbow arthroplasty. Pt will benefit from skilled Ot services to max independence with ADL and mobility for ADL to facilitate safe D/C home with 24/7 S A of family. Pt states she takes "dizzy" pill. Daughter called and onfirmed that pt has vertigo and takes medication for it. Nsg notified and called MD to address vertigo. IWill see additional session. PT to eval for mobility needs    OT Assessment  Patient needs continued OT Services    Follow Up Recommendations  Home health OT    Barriers to Discharge None    Equipment Recommendations  Tub/shower bench    Recommendations for Other Services PT consult  Frequency  Min 2X/week    Precautions / Restrictions Precautions Precautions: Fall;Other (comment) (NWB) Required Braces or Orthoses: Other Brace/Splint (sling) Restrictions LUE Weight Bearing: Non weight bearing   Pertinent Vitals/Pain 5. nsg aware. meds given.    ADL  Eating/Feeding: Simulated;Set up Where Assessed - Eating/Feeding: Chair Grooming: Performed;Minimal assistance Where Assessed - Grooming: Supported standing Upper Body Bathing: Simulated;Minimal assistance Where Assessed - Upper Body Bathing: Supported sit to stand Lower Body Bathing: Simulated;Minimal assistance Where Assessed - Lower Body Bathing: Supported sit to stand Upper Body Dressing: Simulated;Moderate assistance Where Assessed - Upper Body Dressing: Unsupported sitting Lower Body Dressing: Simulated;Minimal assistance Where Assessed - Lower Body Dressing: Supported sit to stand Toilet Transfer: Performed;Min guard Statistician Method: Sit to stand;Stand pivot Education administrator: Comfort height toilet Toileting - Clothing Manipulation and Hygiene: Performed;Minimal assistance Where Assessed - Engineer, mining and Hygiene: Standing Equipment Used: Gait belt Transfers/Ambulation Related to ADLs: min guard ADL Comments: limited by lethargy and c/o dizziness    OT Diagnosis: Generalized weakness;Acute pain  OT Problem List: Decreased strength;Decreased range of motion;Decreased activity tolerance;Impaired balance (sitting and/or standing);Decreased knowledge of use of DME or AE;Decreased knowledge of precautions;Impaired UE functional use;Obesity;Pain;Increased edema OT Treatment Interventions: Self-care/ADL training;Therapeutic exercise;Therapeutic activities;Patient/family education   OT Goals Acute Rehab OT Goals OT Goal Formulation: With patient Time For Goal Achievement: 02/27/12 Potential to Achieve Goals: Good ADL Goals Pt Will Perform Upper Body Bathing: with caregiver independent in assisting;with supervision;Sitting, chair;Unsupported ADL Goal: Upper Body Bathing - Progress: Goal set today Pt Will Perform Upper Body Dressing: with supervision;with caregiver independent in assisting;Sitting, chair;Unsupported ADL Goal: Upper Body Dressing - Progress: Goal set today Additional ADL Goal #1: Pts' family will be independent in donning/doffing sling. ADL Goal: Additional Goal #1 - Progress: Goal set today Arm Goals Pt Will Perform AROM: Independently;Left upper extremity;to maintain range of motion;Other (comment) (squeeze ball) Arm Goal: AROM - Progress: Goal set today Additional Arm Goal #1: Pt/family independent with use of edema control techniques, including elevation and ice. Arm Goal: Additional Goal #1 - Progress: Goal set today  Visit Information  Last OT Received On: 02/20/12    Subjective Data      Prior Functioning  Vision/Perception  Home Living Lives With: Family Available Help at Discharge: Available 24  hours/day Type of Home: House Home Access: Stairs to enter [employer] of Steps: 4 Entrance Stairs-Rails: Right;Left;Can reach both Home Layout: Two level;Bed/bath upstairs;1/2 bath on main level Alternate Level Stairs-Number of Steps: 14 Alternate Level Stairs-Rails: Right;Left;Can reach both Bathroom Shower/Tub: Tub/shower unit [employer]  Toilet: Standard Bathroom Accessibility: Yes How Accessible: Accessible via walker Home Adaptive Equipment: None Prior Function Level of Independence: Independent Able to Take Stairs?: Yes Driving: Yes Vocation: Retired Musician: No difficulties Dominant Hand: Right      Cognition  Overall Cognitive Status: Appears within functional limits for tasks assessed/performed Arousal/Alertness: Lethargic Orientation Level: Appears intact for tasks assessed Behavior During Session: [facility] for tasks performed    Extremity/Trunk Assessment Right Upper Extremity Assessment RUE ROM/Strength/Tone: [facility] for tasks assessed Left Upper Extremity Assessment LUE ROM/Strength/Tone: Deficits LUE ROM/Strength/Tone Deficits: L UE splint. edematous L hand. shoulder motion WNL LUE Sensation: WFL - Light Touch;WFL - Proprioception LUE Coordination: Deficits LUE Coordination Deficits: due to splint and edema Right Lower Extremity Assessment RLE ROM/Strength/Tone: [facility] for tasks assessed Left Lower Extremity Assessment LLE ROM/Strength/Tone: [facility] for tasks assessed Trunk Assessment Trunk Assessment: Normal   Mobility  Shoulder Instructions  Bed Mobility Bed Mobility: Not assessed Transfers Transfers: Sit to Stand;Stand to Sit Sit to Stand: From chair/3-in-1;4: Min guard;With upper extremity assist Stand to Sit: 4: Min guard;To chair/3-in-1 Details for Transfer Assistance: c/o being "dizzy"       Exercise     Balance  min guard due to history of vertigo    End of Session OT - End of Session Equipment Utilized During Treatment:  Gait belt Activity Tolerance: Patient tolerated treatment well Patient left: in chair;with call bell/phone within reach;with family/visitor present Nurse Communication: Patient requests pain meds;Other (comment) (need for vertigo meds)  GO Functional Assessment Tool Used: clinical judgement Functional Limitation: Self care Self Care Current Status (Z6109): At least 40 percent but less than 60 percent impaired, limited or restricted Self Care Goal Status (U0454): At least 40 percent but less than 60 percent impaired, limited or restricted   Harrietta Incorvaia,HILLARY 02/20/2012, 12:27 PM [facility], OTR/L  [phone] 02/20/2012

## 2012-02-20 NOTE — Progress Notes (Signed)
PT reported that patient c/o  Feeling dizzy during therapy. Patient assessed, v/s: BP 138/70. HR 76, RESP 20. O2sat room air 96%. No SOB noted. Patient reported that she takes Merclizine twice daily at home.Reviewed patient history. Has h/o of chronic vertigo.Dr. Cleophas Dunker, Theron Arista notified. Ordered  Merclizine 12.5mg  PO three times daily. Med administered as ordered.No further c/o at this time. Will continue to monitor.

## 2012-02-20 NOTE — Evaluation (Signed)
Physical Therapy Evaluation Patient Details Name: Latasha Wang MRN: [id number] DOB: 1941/02/17 Today's Date: 02/20/2012 Time: 6213-0865 PT Time Calculation (min): 16 min  PT Assessment / Plan / Recommendation Clinical Impression  Pt is a 71 y/o female s/p elbow replacement secondary to fall.  Pt has fallen at least twice in past 6 months according to family.  Pt lives with daughter who is available 24/7. Pt's current balance and gait deficits appear to be chronic as her family report that he walking today was her baseline.  Suggesting HHPT to assess pt for most appropriate AD.      PT Assessment  All further PT needs can be met in the next venue of care    Follow Up Recommendations  Home health PT;Supervision/Assistance - 24 hour    Barriers to Discharge        Equipment Recommendations  Tub/shower bench;Small-based quad cane    Recommendations for Other Services     Frequency      Precautions / Restrictions Precautions Precautions: Fall;Other (comment) Required Braces or Orthoses: Other Brace/Splint Restrictions LUE Weight Bearing: Non weight bearing   Pertinent Vitals/Pain Pt c/o pain in L elbow 4/10.  RN notified.       Mobility  Bed Mobility Bed Mobility: Supine to Sit;Sit to Supine Supine to Sit: 4: Min assist;HOB elevated (HOB >60 degrees pt unable to lay flat. ) Sit to Supine: 5: Supervision;HOB elevated Details for Bed Mobility Assistance: Pt required min assist for R LE during sit to supine transition.  Pt uanble to perform true supine<>sit as pt unable to tolerate supine position.   Transfers Transfers: Sit to Stand;Stand to Sit Sit to Stand: 5: Supervision;From bed Stand to Sit: 5: Supervision;To bed Details for Transfer Assistance: Supervision for safety no c/o dizziness Ambulation/Gait Ambulation/Gait Assistance: 4: Min guard Ambulation Distance (Feet): 80 Feet Assistive device: 1 person hand held assist Ambulation/Gait Assistance Details: Pt ambulating  with HHA initially. Able to progress to no AD with min guard assist.  Cueing to increase gait speed and increase bilateral step height.  Pt sliding her feet.  Gait Pattern: Decreased stride length;Decreased hip/knee flexion - right;Decreased hip/knee flexion - left;Decreased dorsiflexion - right;Decreased dorsiflexion - left;Shuffle Gait velocity: excessively slow.  Stairs: No Wheelchair Mobility Wheelchair Mobility: No    Exercises     PT Diagnosis: Generalized weakness;Acute pain;Abnormality of gait  PT Problem List: Decreased strength;Decreased balance;Decreased mobility;Pain PT Treatment Interventions:     PT Goals    Visit Information  Last PT Received On: 02/20/12 Assistance Needed: +1    Subjective Data      Prior Functioning  Home Living Lives With: Family;Daughter Available Help at Discharge: Available 24 hours/day Type of Home: House Home Access: Stairs to enter Secretary/administrator of Steps: 4 Entrance Stairs-Rails: Right;Left;Can reach both Home Layout: Two level;Bed/bath upstairs;1/2 bath on main level Alternate Level Stairs-Number of Steps: 14 Alternate Level Stairs-Rails: Right;Left;Can reach both Bathroom Shower/Tub: Engineer, manufacturing systems: Standard Bathroom Accessibility: Yes How Accessible: Accessible via walker Home Adaptive Equipment: None Prior Function Level of Independence: Independent Able to Take Stairs?: Yes Driving: Yes Vocation: Retired Musician: No difficulties Dominant Hand: Right    Cognition  Overall Cognitive Status: Appears within functional limits for tasks assessed/performed Arousal/Alertness: Lethargic Orientation Level: Appears intact for tasks assessed Behavior During Session: [facility] for tasks performed    Extremity/Trunk Assessment Right Upper Extremity Assessment RUE ROM/Strength/Tone: [facility] for tasks assessed Left Upper Extremity Assessment LUE ROM/Strength/Tone: Unable to fully assess LUE  ROM/Strength/Tone Deficits: L UE splint. edematous L hand. shoulder motion WNL LUE Sensation: WFL - Light Touch;WFL - Proprioception LUE Coordination: Deficits LUE Coordination Deficits: due to splint and edema Right Lower Extremity Assessment RLE ROM/Strength/Tone: WFL for tasks assessed Left Lower Extremity Assessment LLE ROM/Strength/Tone: WFL for tasks assessed Trunk Assessment Trunk Assessment: Normal   Balance Balance Balance Assessed: Yes Static Sitting Balance Static Sitting - Balance Support: Feet supported Static Sitting - Level of Assistance: 7: Independent Static Standing Balance Static Standing - Balance Support: No upper extremity supported Static Standing - Level of Assistance: 5: Stand by assistance Static Standing - Comment/# of Minutes: 1+ minute standing at side of bed with mild posterior sway but no loss of balance.  Dynamic Standing Balance Dynamic Standing - Balance Support: Right upper extremity supported Dynamic Standing - Level of Assistance: 5: Stand by assistance Dynamic Standing - Balance Activities: Lateral lean/weight shifting;Forward lean/weight shifting  End of Session PT - End of Session Equipment Utilized During Treatment: Gait belt;Other (comment) (Left arm sling. ) Activity Tolerance: Patient tolerated treatment well Patient left: in bed;with call bell/phone within reach;with family/visitor present;with nursing in room Nurse Communication: Mobility status  GP Functional Assessment Tool Used: Clinical Judgement Functional Limitation: Mobility: Walking and moving around Mobility: Walking and Moving Around Current Status (Z6109): At least 1 percent but less than 20 percent impaired, limited or restricted Mobility: Walking and Moving Around Goal Status [phone]): 0 percent impaired, limited or restricted Mobility: Walking and Moving Around Discharge Status [phone]): At least 1 percent but less than 20 percent impaired, limited or restricted    Curties Conigliaro 02/20/2012, 4:09 PM  Corlene Sabia L. Arlee Bossard DPT [phone]

## 2012-02-20 NOTE — Progress Notes (Signed)
Patient ID: Latasha Wang, female   DOB: [birth year], 71 y.o.   MRN: [id number] PATIENT ID: Latasha Wang        MRN:  [id number]          DOB/AGE: February 15, 1941 / 71 y.o.  Latasha Campbell, MD   Jacqualine Code, PA-C [address] [place], [place], [place]  [address]                             [phone]   PROGRESS NOTE  Subjective:  negative for Chest Pain  negative for Shortness of Breath  negative for Nausea/Vomiting   negative for Calf Pain  negative for Bowel Movement   Tolerating Diet: yes         Patient reports pain as mild.       Objective: Vital signs in last 24 hours:   Patient Vitals for the past 24 hrs:  BP Temp Pulse Resp SpO2  02/20/12 0613 120/54 mmHg 97.7 F (36.5 C) 78  18  97 %  02/20/12 0148 117/54 mmHg 97.4 F (36.3 C) 75  16  98 %  02/19/12 2117 111/52 mmHg 97.2 F (36.2 C) 90  18  93 %  02/19/12 1745 140/70 mmHg 98.9 F (37.2 C) 72  18  96 %  02/19/12 1648 138/80 mmHg 97.6 F (36.4 C) 78  20  -  02/19/12 1540 138/70 mmHg 98.8 F (37.1 C) 76  18  96 %  02/19/12 1440 140/80 mmHg 98.7 F (37.1 C) 78  18  99 %  02/19/12 1415 - 97.5 F (36.4 C) 76  12  100 %  02/19/12 1404 138/60 mmHg - 75  11  99 %  02/19/12 1400 - - 73  11  100 %  02/19/12 1349 137/58 mmHg - 76  12  100 %  02/19/12 1345 - - 75  13  100 %  02/19/12 1334 140/61 mmHg - 72  12  99 %  02/19/12 1330 - - 72  13  99 %  02/19/12 1319 142/62 mmHg - 75  9  100 %  02/19/12 1315 - - 74  11  100 %  02/19/12 1304 138/59 mmHg - 72  10  100 %  02/19/12 1300 138/59 mmHg - 73  12  100 %  02/19/12 1249 139/61 mmHg - 78  12  100 %  02/19/12 1245 139/61 mmHg - 74  13  99 %  02/19/12 1235 - - 78  14  99 %  02/19/12 1234 140/60 mmHg - - - -  02/19/12 1231 140/60 mmHg 96.6 F (35.9 C) 77  12  99 %      Intake/Output from previous day:   08/30 0701 - 08/31 0700 In: 2150 [I.V.:2150] Out: 3250 [Urine:3250]   Intake/Output this shift:       Intake/Output      08/30 0701 - 08/31 0700 08/31 0701 -  09/01 0700   I.V. (mL/kg) 2150 (28.8)    Total Intake(mL/kg) 2150 (28.8)    Urine (mL/kg/hr) 3250 (1.8)    Total Output 3250    Net -1100            LABORATORY DATA:  Basename 02/20/12 0515 02/19/12 0654  WBC 8.0 6.1  HGB 10.5* 12.1  HCT 32.9* 36.9  PLT 216 233    Basename 02/19/12 0654  NA 141  K 3.6  CL 104  CO2 24  BUN 13  CREATININE 0.67  GLUCOSE 144*  CALCIUM 9.8   Lab Results  Component Value Date   INR 0.91 02/19/2012   INR 0.96 01/24/2011    Recent Radiographic Studies :  Dg Chest 2 View  02/19/2012  *RADIOLOGY REPORT*  Clinical Data: Preop elbow surgery.  Hypertension.  CHEST - 2 VIEW  Comparison: 01/24/2011  Findings: Cardiomegaly.  Atheromatous tortuous aorta.  Lungs clear. No effusion.  Regional bones unremarkable.  IMPRESSION:  1.  Persistent cardiomegaly   Original Report Authenticated By: Osa Craver, M.D.    Dg Elbow 2 Views Left  02/19/2012  *RADIOLOGY REPORT*  Clinical Data: Status post left elbow arthroplasty.  LEFT ELBOW - 2 VIEW  Comparison: Intraoperative fluoro spot images 02/19/2012.  Findings: The patient is status post left total elbow arthroplasty. The humeral and ulnar components are well seated.  The joint is located.  IMPRESSION: Status post left elbow arthroplasty without radiographic evidence for complication.   Original Report Authenticated By: Jamesetta Orleans. MATTERN, M.D.    Dg Elbow Complete Left  02/19/2012  *RADIOLOGY REPORT*  Clinical Data: Left elbow arthroplasty  LEFT ELBOW - COMPLETE 3+ VIEW,DG C-ARM 1-60 MIN  Comparison: None.  Findings: Six intraoperative fluoroscopic spot images document placement of the humeral and ulnar components of the left elbow arthroplasty.  Negative for fracture or other apparent complication.  IMPRESSION:  1.  Left elbow arthroplasty without apparent complication.   Original Report Authenticated By: Osa Craver, M.D.    Dg C-arm 1-60 Min  02/19/2012  *RADIOLOGY REPORT*  Clinical Data:  Left elbow arthroplasty  LEFT ELBOW - COMPLETE 3+ VIEW,DG C-ARM 1-60 MIN  Comparison: None.  Findings: Six intraoperative fluoroscopic spot images document placement of the humeral and ulnar components of the left elbow arthroplasty.  Negative for fracture or other apparent complication.  IMPRESSION:  1.  Left elbow arthroplasty without apparent complication.   Original Report Authenticated By: Osa Craver, M.D.      Examination:  General appearance: alert, cooperative and fatigued  Wound Exam: clean, dry, intact   Drainage:  None: wound tissue dry  Motor Exam: Wrist Dorsiflexion and FDP Intact  Sensory Exam: Radial, Ulnar and Median normal  Vascular Exam: Normal  Assessment:    1 Day Post-Op  Procedure(s) (LRB): TOTAL ELBOW ARTHROPLASTY (Left)  ADDITIONAL DIAGNOSIS:  Principal Problem:  *Humerus distal fracture, left Active Problems:  DIABETES MELLITUS, TYPE II  HYPERLIPIDEMIA  ANXIETY  HYPERTENSION  GERD     Plan:   DVT Prophylaxis:  None  DISCHARGE PLAN: Home  DISCHARGE NEEDS: has sling    Wishes to go home, comfortable, VS stable     WHITFIELD, PETER W 02/20/2012, 10:27 AM

## 2012-02-21 NOTE — Care Management Note (Signed)
    Page 1 of 2   02/21/2012     4:32:25 PM   CARE MANAGEMENT NOTE 02/21/2012  Patient:  CICLEY, GANESH   Account Number:  [id number]  Date Initiated:  02/20/2012  Documentation initiated by:  [facility]  Subjective/Objective Assessment:   Left total elbow arthroplasty     Action/Plan:   Anticipated DC Date:  02/20/2012   Anticipated DC Plan:  HOME W HOME HEALTH SERVICES      DC Planning Services  CM consult      [facility]  HOME HEALTH   Choice offered to / List presented to:  C-4 Adult Children        HH arranged  HH-2 PT  HH-3 OT      [facility] agency  [facility]   Status of service:  Completed, signed off Medicare Important Message given?   (If response is "NO", the following Medicare IM given date fields will be blank) Date Medicare IM given:   Date Additional Medicare IM given:    Discharge Disposition:  HOME W HOME HEALTH SERVICES  Per UR Regulation:    If discussed at Long Length of Stay Meetings, dates discussed:    Comments:  02/21/2012 1630 [facility] states soc will be within 24-48 hour of care. They should follow up with pt on 9/2 or 9/3. Isidoro Donning RN CCM Case Mgmt phone [phone]  02/20/2012 1700 Spoke to Nathaniel Man # [phone]. States she request [facility]. NCM will check with [facility] for start of care for [facility] PT/OT and follow up with dtr on 9/1. Requesting tub bench. Spoke to [facility] DME rep, and tub bench is an out of pocket item. Also cane is out of pocket and not covered by insurance. Will notify dtr tomorrow on [facility] agency for PT/OT. Isidoro Donning RN CCM Case Mgmt phone [phone]

## 2012-02-22 NOTE — Discharge Summary (Signed)
[facility] (OTS)  Patient ID: Latasha Wang MRN: [id number] DOB/AGE: [birth year] 71 y.o.  Admit date: 02/19/2012 Discharge date: 02/20/2012 Admission Diagnoses: L distal humerus fx DM Hyperlipidemia  Anxiety HTN GERD  Discharge Diagnoses:  Principal Problem:  *Humerus distal fracture, left Active Problems:  DIABETES MELLITUS, TYPE II  HYPERLIPIDEMIA  ANXIETY  HYPERTENSION  GERD   Procedures Performed: (02/19/2012) Left total elbow arthroplasty  Discharged Condition: good  Hospital Course:  Pt sustained L distal humerus fracture.  Taken to OR on 02/19/2012 for Left total elbow arthroplasty.  Tolerated well. Observed in hospital overnight for Pain control and routine post op monitoring.  Pt was stable the following morning and discharged to home  Consults: None  Significant Diagnostic Studies: none  Treatments: IV hydration, antibiotics: Ancef, analgesia: acetaminophen, Morphine and norco and surgery: as above  Discharge Exam: (per Dr. Cleophas Dunker, Ortho MD cross cover)  Subjective:  negative for Chest Pain  negative for Shortness of Breath  negative for Nausea/Vomiting  negative for Calf Pain  negative for Bowel Movement  Tolerating Diet: yes  Patient reports pain as mild.  Objective:  Vital signs in last 24 hours:  Patient Vitals for the past 24 hrs:   BP  Temp  Pulse  Resp  SpO2   02/20/12 0613  120/54 mmHg  97.7 F (36.5 C)  78  18  97 %   02/20/12 0148  117/54 mmHg  97.4 F (36.3 C)  75  16  98 %   02/19/12 2117  111/52 mmHg  97.2 F (36.2 C)  90  18  93 %   02/19/12 1745  140/70 mmHg  98.9 F (37.2 C)  72  18  96 %   02/19/12 1648  138/80 mmHg  97.6 F (36.4 C)  78  20  -   02/19/12 1540  138/70 mmHg  98.8 F (37.1 C)  76  18  96 %   02/19/12 1440  140/80 mmHg  98.7 F (37.1 C)  78  18  99 %   02/19/12 1415  -  97.5 F (36.4 C)  76  12  100 %   02/19/12 1404  138/60 mmHg  -  75  11  99 %   02/19/12 1400  -  -  73  11  100 %     02/19/12 1349  137/58 mmHg  -  76  12  100 %   02/19/12 1345  -  -  75  13  100 %   02/19/12 1334  140/61 mmHg  -  72  12  99 %   02/19/12 1330  -  -  72  13  99 %   02/19/12 1319  142/62 mmHg  -  75  9  100 %   02/19/12 1315  -  -  74  11  100 %   02/19/12 1304  138/59 mmHg  -  72  10  100 %   02/19/12 1300  138/59 mmHg  -  73  12  100 %   02/19/12 1249  139/61 mmHg  -  78  12  100 %   02/19/12 1245  139/61 mmHg  -  74  13  99 %   02/19/12 1235  -  -  78  14  99 %   02/19/12 1234  140/60 mmHg  -  -  -  -   02/19/12 1231  140/60 mmHg  96.6 F (35.9  C)  77  12  99 %    Intake/Output from previous day:  08/30 0701 - 08/31 0700  In: 2150 [I.V.:2150]  Out: 3250 [Urine:3250]  Intake/Output this shift:   Intake/Output  08/30 0701 - 08/31 0700 08/31 0701 - 09/01 0700  I.V. (mL/kg) 2150 (28.8)  Total Intake(mL/kg) 2150 (28.8)  Urine (mL/kg/hr) 3250 (1.8)  Total Output 3250  Net -1100   LABORATORY DATA:   Basename  02/20/12 0515  02/19/12 0654   WBC  8.0  6.1   HGB  10.5*  12.1   HCT  32.9*  36.9   PLT  216  233     Basename  02/19/12 0654   NA  141   K  3.6   CL  104   CO2  24   BUN  13   CREATININE  0.67   GLUCOSE  144*   CALCIUM  9.8    Lab Results   Component  Value  Date    INR  0.91  02/19/2012    INR  0.96  01/24/2011    Recent Radiographic Studies : Dg Chest 2 View  02/19/2012 *RADIOLOGY REPORT* Clinical Data: Preop elbow surgery. Hypertension. CHEST - 2 VIEW Comparison: 01/24/2011 Findings: Cardiomegaly. Atheromatous tortuous aorta. Lungs clear. No effusion. Regional bones unremarkable. IMPRESSION: 1. Persistent cardiomegaly Original Report Authenticated By: Osa Craver, M.D.  Dg Elbow 2 Views Left  02/19/2012 *RADIOLOGY REPORT* Clinical Data: Status post left elbow arthroplasty. LEFT ELBOW - 2 VIEW Comparison: Intraoperative fluoro spot images 02/19/2012. Findings: The patient is status post left total elbow arthroplasty. The humeral and ulnar  components are well seated. The joint is located. IMPRESSION: Status post left elbow arthroplasty without radiographic evidence for complication. Original Report Authenticated By: Jamesetta Orleans. MATTERN, M.D.  Dg Elbow Complete Left  02/19/2012 *RADIOLOGY REPORT* Clinical Data: Left elbow arthroplasty LEFT ELBOW - COMPLETE 3+ VIEW,DG C-ARM 1-60 MIN Comparison: None. Findings: Six intraoperative fluoroscopic spot images document placement of the humeral and ulnar components of the left elbow arthroplasty. Negative for fracture or other apparent complication. IMPRESSION: 1. Left elbow arthroplasty without apparent complication. Original Report Authenticated By: Osa Craver, M.D.  Dg C-arm 1-60 Min  02/19/2012 *RADIOLOGY REPORT* Clinical Data: Left elbow arthroplasty LEFT ELBOW - COMPLETE 3+ VIEW,DG C-ARM 1-60 MIN Comparison: None. Findings: Six intraoperative fluoroscopic spot images document placement of the humeral and ulnar components of the left elbow arthroplasty. Negative for fracture or other apparent complication. IMPRESSION: 1. Left elbow arthroplasty without apparent complication. Original Report Authenticated By: Osa Craver, M.D.  Examination:  General appearance: alert, cooperative and fatigued  Wound Exam: clean, dry, intact  Drainage: None: wound tissue dry  Motor Exam: Wrist Dorsiflexion and FDP Intact  Sensory Exam: Radial, Ulnar and Median normal  Vascular Exam: Normal  Assessment:  1 Day Post-Op Procedure(s) (LRB):  TOTAL ELBOW ARTHROPLASTY (Left)  ADDITIONAL DIAGNOSIS:  Principal Problem:  *Humerus distal fracture, left  Active Problems:  DIABETES MELLITUS, TYPE II  HYPERLIPIDEMIA  ANXIETY  HYPERTENSION  GERD   Plan:  DVT Prophylaxis: None  DISCHARGE PLAN: Home  DISCHARGE NEEDS: has sling  Wishes to go home, comfortable, VS stable  WHITFIELD, PETER W  02/20/2012, 10:27 AM   Disposition: 01-Home or Self Care  Discharge Orders    Future  Appointments: Provider: Department: Dept Phone: Center:   06/06/2012 3:30 PM Corwin Levins, MD [facility] [phone] [facility]     Future Orders Please Complete By Expires  Diet Carb Modified      Call MD / Call 911      Comments:   If you experience chest pain or shortness of breath, CALL 911 and be transported to the hospital emergency room.  If you develope a fever above 101 F, pus (white drainage) or increased drainage or redness at the wound, or calf pain, call your surgeon's office.   Constipation Prevention      Comments:   Drink plenty of fluids.  Prune juice may be helpful.  You may use a stool softener, such as Colace (over the counter) 100 mg twice a day.  Use MiraLax (over the counter) for constipation as needed.   Increase activity slowly as tolerated      Discharge instructions      Comments:   Orthopaedic Trauma Service Discharge Instructions,   General Discharge Instructions  WEIGHT BEARING STATUS: Nonweight bearing Left arm  RANGE OF MOTION/ACTIVITY: no Range of motin L elbow at this time.  Maintain splint until follow up, use sling.  Ok to move fingers to help with swelling  Diet: as you were eating previously.  Can use over the counter stool softeners and bowel preparations, such as Miralax, to help with bowel movements.  Narcotics can be constipating.  Be sure to drink plenty of fluids  STOP SMOKING OR USING NICOTINE PRODUCTS!!!!  As discussed nicotine severely impairs your body's ability to heal surgical and traumatic wounds but also impairs bone healing.  Wounds and bone heal by forming microscopic blood vessels (angiogenesis) and nicotine is a vasoconstrictor (essentially, shrinks blood vessels).  Therefore, if vasoconstriction occurs to these microscopic blood vessels they essentially disappear and are unable to deliver necessary nutrients to the healing tissue.  This is one modifiable factor that you can do to dramatically increase your chances of healing your  injury.    (This means no smoking, no nicotine gum, patches, etc)  DO NOT USE NONSTEROIDAL ANTI-INFLAMMATORY DRUGS (NSAID'S)  Using products such as Advil (ibuprofen), Aleve (naproxen), Motrin (ibuprofen) for additional pain control during fracture healing can delay and/or prevent the healing response.  If you would like to take over the counter (OTC) medication, Tylenol (acetaminophen) is ok.  However, some narcotic medications that are given for pain control contain acetaminophen as well. Therefore, you should not exceed more than 4000 mg of tylenol in a day if you do not have liver disease.  Also note that there are may OTC medicines, such as cold medicines and allergy medicines that my contain tylenol as well.  If you have any questions about medications and/or interactions please ask your doctor/PA or your pharmacist.   PAIN MEDICATION USE AND EXPECTATIONS  You have likely been given narcotic medications to help control your pain.  After a traumatic event that results in an fracture (broken bone) with or without surgery, it is ok to use narcotic pain medications to help control one's pain.  We understand that everyone responds to pain differently and each individual patient will be evaluated on a regular basis for the continued need for narcotic medications. Ideally, narcotic medication use should last no more than 6-8 weeks (coinciding with fracture healing).   As a patient it is your responsibility as well to monitor narcotic medication use and report the amount and frequency you use these medications when you come to your office visit.   We would also advise that if you are using narcotic medications, you should take a dose prior to therapy to maximize  you participation.  IF YOU ARE ON NARCOTIC MEDICATIONS IT IS NOT PERMISSIBLE TO OPERATE A MOTOR VEHICLE (MOTORCYCLE/CAR/TRUCK/MOPED) OR HEAVY MACHINERY DO NOT MIX NARCOTICS WITH OTHER CNS (CENTRAL NERVOUS SYSTEM) DEPRESSANTS SUCH AS ALCOHOL        ICE AND ELEVATE INJURED/OPERATIVE EXTREMITY  Using ice and elevating the injured extremity above your heart can help with swelling and pain control.  Icing in a pulsatile fashion, such as 20 minutes on and 20 minutes off, can be followed.    Do not place ice directly on skin. Make sure there is a barrier between to skin and the ice pack.    Using frozen items such as frozen peas works well as the conform nicely to the are that needs to be iced.  USE AN ACE WRAP OR TED HOSE FOR SWELLING CONTROL  In addition to icing and elevation, Ace wraps or TED hose are used to help limit and resolve swelling.  It is recommended to use Ace wraps or TED hose until you are informed to stop.    When using Ace Wraps start the wrapping distally (farthest away from the body) and wrap proximally (closer to the body)   Example: If you had surgery on your leg or thing and you do not have a splint on, start the ace wrap at the toes and work your way up to the thigh        If you had surgery on your upper extremity and do not have a splint on, start the ace wrap at your fingers and work your way up to the upper arm  IF YOU ARE IN A SPLINT OR CAST DO NOT REMOVE IT FOR ANY REASON   If your splint gets wet for any reason please contact the office immediately. You may shower in your splint or cast as long as you keep it dry.  This can be done by wrapping in a cast cover or garbage back (or similar)  Do Not stick any thing down your splint or cast such as pencils, money, or hangers to try and scratch yourself with.  If you feel itchy take benadryl as prescribed on the bottle for itching  IF YOU ARE IN A CAM BOOT (BLACK BOOT)  You may remove boot periodically. Perform daily dressing changes as noted below.  Wash the liner of the boot regularly and wear a sock when wearing the boot. It is recommended that you sleep in the boot until told otherwise  CALL THE OFFICE WITH ANY QUESTIONS OR CONCERTS: [phone]     Discharge  Pin Site Instructions  Dress pins daily with Kerlix roll starting on POD 2. Wrap the Kerlix so that it tamps the skin down around the pin-skin interface to prevent/limit motion of the skin relative to the pin.  (Pin-skin motion is the primary cause of pain and infection related to external fixator pin sites).  Remove any crust or coagulum that may obstruct drainage with a saline moistened gauze or soap and water.  After POD 3, if there is no discernable drainage on the pin site dressing, the interval for change can by increased to every other day.  You may shower with the fixator, cleaning all pin sites gently with soap and water.  If you have a surgical wound this needs to be completely dry and without drainage before showering.  The extremity can be lifted by the fixator to facilitate wound care and transfers.  Notify the office/Doctor if you experience increasing drainage, redness,  or pain from a pin site, or if you notice purulent (thick, snot-like) drainage.  Discharge Wound Care Instructions  Do NOT apply any ointments, solutions or lotions to pin sites or surgical wounds.  These prevent needed drainage and even though solutions like hydrogen peroxide kill bacteria, they also damage cells lining the pin sites that help fight infection.  Applying lotions or ointments can keep the wounds moist and can cause them to breakdown and open up as well. This can increase the risk for infection. When in doubt call the office.  Surgical incisions should be dressed daily.  If any drainage is noted, use one layer of adaptic, then gauze, Kerlix, and an ace wrap.  Once the incision is completely dry and without drainage, it may be left open to air out.  Showering may begin 36-48 hours later.  Cleaning gently with soap and water.  Traumatic wounds should be dressed daily as well.    One layer of adaptic, gauze, Kerlix, then ace wrap.  The adaptic can be discontinued once the draining has ceased    If  you have a wet to dry dressing: wet the gauze with saline the squeeze as much saline out so the gauze is moist (not soaking wet), place moistened gauze over wound, then place a dry gauze over the moist one, followed by Kerlix wrap, then ace wrap.   Driving restrictions      Comments:   No driving   Lifting restrictions      Comments:   No lifting     Medication List  As of 02/22/2012  8:05 AM   TAKE these medications         acetaminophen 500 MG tablet   Commonly known as: TYLENOL   Take 1-2 tablets (500-1,000 mg total) by mouth every 6 (six) hours as needed for pain or fever. For pain      amLODipine 10 MG tablet   Commonly known as: NORVASC   Take 10 mg by mouth daily.      aspirin EC 81 MG tablet   Take 81 mg by mouth daily.      clonazePAM 0.5 MG tablet   Commonly known as: KLONOPIN   Take 0.5 mg by mouth 2 (two) times daily.      HYDROcodone-acetaminophen 5-325 MG per tablet   Commonly known as: NORCO/VICODIN   Take 1-2 tablets by mouth every 6 (six) hours as needed for pain. For pain      meclizine 12.5 MG tablet   Commonly known as: ANTIVERT   Take 12.5-25 mg by mouth every 6 (six) hours as needed. For dizziness      metFORMIN 500 MG (MOD) 24 hr tablet   Commonly known as: GLUMETZA   Take 500 mg by mouth daily with breakfast.      oxyCODONE 5 MG immediate release tablet   Commonly known as: Oxy IR/ROXICODONE   Take 1-2 tablets (5-10 mg total) by mouth every 6 (six) hours as needed for pain (breakthrough pain).      pioglitazone 45 MG tablet   Commonly known as: ACTOS   Take 45 mg by mouth daily.      rosuvastatin 10 MG tablet   Commonly known as: CRESTOR   Take 10 mg by mouth daily.      valsartan 320 MG tablet   Commonly known as: DIOVAN   Take 320 mg by mouth daily.           Follow-up Information  Follow up with Budd Palmer, MD. Schedule an appointment as soon as possible for a visit on 02/24/2012.   Contact information:   [address] [place] [address] [phone]           Signed:  Mearl Latin, PA-C [facility] [phone] (P) 02/22/2012, 8:05 AM

## 2012-02-23 ENCOUNTER — Encounter (HOSPITAL_COMMUNITY): Payer: Self-pay | Admitting: Orthopedic Surgery

## 2012-02-24 ENCOUNTER — Other Ambulatory Visit: Payer: Self-pay

## 2012-02-24 MED ORDER — CLONAZEPAM 0.5 MG PO TABS: 0.5000 mg | ORAL_TABLET | Freq: Two times a day (BID) | ORAL | Status: DC

## 2012-02-24 MED ORDER — PIOGLITAZONE HCL 45 MG PO TABS: 45.0000 mg | ORAL_TABLET | Freq: Every day | ORAL | Status: DC

## 2012-02-24 NOTE — Telephone Encounter (Signed)
Faxed hardcopy to pharmacy. 

## 2012-02-24 NOTE — Telephone Encounter (Signed)
Done hardcopy to robin  

## 2012-03-15 ENCOUNTER — Other Ambulatory Visit: Payer: Self-pay | Admitting: Internal Medicine

## 2012-05-23 ENCOUNTER — Other Ambulatory Visit: Payer: Self-pay | Admitting: Internal Medicine

## 2012-05-31 ENCOUNTER — Other Ambulatory Visit (INDEPENDENT_AMBULATORY_CARE_PROVIDER_SITE_OTHER): Payer: Medicare Other

## 2012-05-31 DIAGNOSIS — E119 Type 2 diabetes mellitus without complications: Secondary | ICD-10-CM

## 2012-05-31 LAB — BASIC METABOLIC PANEL
CO2: 28 mEq/L (ref 19–32)
Calcium: 9.2 mg/dL (ref 8.4–10.5)
GFR: 54.39 mL/min — ABNORMAL LOW (ref >60.00)
Sodium: 140 mEq/L (ref 135–145)

## 2012-05-31 LAB — LIPID PANEL
HDL: 64.8 mg/dL (ref >39.00)
Total CHOL/HDL Ratio: 2
VLDL: 13.6 mg/dL (ref 0.0–40.0)

## 2012-06-06 ENCOUNTER — Encounter: Payer: Self-pay | Admitting: Internal Medicine

## 2012-06-06 ENCOUNTER — Ambulatory Visit (INDEPENDENT_AMBULATORY_CARE_PROVIDER_SITE_OTHER): Payer: Medicare Other | Admitting: Internal Medicine

## 2012-06-06 VITALS — BP 120/64 | HR 66 | Temp 99.1°F | Ht <= 58 in | Wt 163.6 lb

## 2012-06-06 DIAGNOSIS — Z0000 Encounter for general adult medical examination without abnormal findings: Secondary | ICD-10-CM

## 2012-06-06 DIAGNOSIS — I10 Essential (primary) hypertension: Secondary | ICD-10-CM

## 2012-06-06 DIAGNOSIS — E119 Type 2 diabetes mellitus without complications: Secondary | ICD-10-CM

## 2012-06-06 DIAGNOSIS — E785 Hyperlipidemia, unspecified: Secondary | ICD-10-CM

## 2012-06-06 NOTE — Progress Notes (Signed)
Subjective:    Patient ID: Latasha Wang, female    DOB: Dec 08, 1940, 71 y.o.   MRN: [id number]  HPI  Here to f/u; overall doing ok,  Pt denies chest pain, increased sob or doe, wheezing, orthopnea, PND, increased LE swelling, palpitations, dizziness or syncope.  Pt denies new neurological symptoms such as new headache, or facial or extremity weakness or numbness   Pt denies polydipsia, polyuria, or low sugar symptoms such as weakness or confusion improved with po intake.  Pt states overall good compliance with meds, trying to follow lower cholesterol, diabetic diet, wt overall stable but little exercise however.  Dizziness/vertigo gone after 15 yrs. No other acute complaints Past Medical History  Diagnosis Date  . ANXIETY 06/20/2008  . DEPRESSION 05/09/2007  . FATTY LIVER DISEASE 09/01/2007  . Gastroparesis 09/01/2007  . GERD 05/09/2007  . HYPERLIPIDEMIA 07/05/2007  . HYPERTENSION 05/09/2007  . LATERAL EPICONDYLITIS, RIGHT 12/17/2008  . LIVER FUNCTION TESTS, ABNORMAL 07/19/2007  . MYALGIA 05/09/2007  . Other malaise and fatigue 07/05/2007  . VERTIGO, CHRONIC 09/01/2007  . Diabetes mellitus    Past Surgical History  Procedure Date  . Cataract extraction, bilateral   . 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 She does not have any smokeless tobacco history on file. She reports that she does not drink alcohol or use illicit drugs. family history is not on file. No Known Allergies Current Outpatient Prescriptions on File Prior to Visit  Medication Sig Dispense Refill  . acetaminophen (TYLENOL) 500 MG tablet Take 1-2 tablets (500-1,000 mg total) by mouth every 6 (six) hours as needed for pain or fever. For pain  90 tablet  0  . amLODipine (NORVASC) 10 MG tablet TAKE 1 TABLET BY MOUTH ONCE DAILY  90 tablet  3  . aspirin EC 81 MG tablet  Take 81 mg by mouth daily.      . clonazePAM (KLONOPIN) 0.5 MG tablet Take 1 tablet (0.5 mg total) by mouth 2 (two) times daily.  60 tablet  2  . HYDROcodone-acetaminophen (NORCO/VICODIN) 5-325 MG per tablet Take 1-2 tablets by mouth every 6 (six) hours as needed for pain. For pain  80 tablet  1  . meclizine (ANTIVERT) 12.5 MG tablet Take 12.5-25 mg by mouth every 6 (six) hours as needed. For dizziness      . metFORMIN (GLUMETZA) 500 MG (MOD) 24 hr tablet Take 500 mg by mouth daily with breakfast.      . pioglitazone (ACTOS) 45 MG tablet Take 1 tablet (45 mg total) by mouth daily.  90 tablet  3  . rosuvastatin (CRESTOR) 10 MG tablet Take 10 mg by mouth daily.      . valsartan (DIOVAN) 320 MG tablet Take 320 mg by mouth daily.       Review of Systems  Constitutional: Negative for diaphoresis and unexpected weight change.  HENT: Negative for tinnitus.   Eyes: Negative for photophobia and visual disturbance.  Respiratory: Negative for choking and stridor.   Gastrointestinal: Negative for vomiting and blood in stool.  Genitourinary: Negative for hematuria and decreased urine volume.  Musculoskeletal: Negative for gait problem.  Skin: Negative for color change and wound.  Neurological: Negative for tremors and numbness.  Psychiatric/Behavioral: Negative for decreased concentration. The patient is not hyperactive.       Objective:  Physical Exam BP 120/64  Pulse 66  Temp 99.1 F (37.3 C) (Oral)  Ht 4\' 9"  (1.448 m)  Wt 163 lb 9 oz (74.191 kg)  BMI 35.39 kg/m2  SpO2 98% Physical Exam  VS noted, not ill appearing Constitutional: Pt appears well-developed and well-nourished.  HENT: Head: Normocephalic.  Right Ear: External ear normal.  Left Ear: External ear normal.  Eyes: Conjunctivae and EOM are normal. Pupils are equal, round, and reactive to light.  Neck: Normal range of motion. Neck supple.  Cardiovascular: Normal rate and regular rhythm.   Pulmonary/Chest: Effort normal and  breath sounds normal.  Neurological: Pt is alert. Not confused  Skin: Skin is warm. No erythema.  Psychiatric: Pt behavior is normal. Thought content normal.     Assessment & Plan:

## 2012-06-06 NOTE — Patient Instructions (Addendum)
Continue all other medications as before Please have the pharmacy call with any other refills you may need. Please continue your efforts at being more active, low cholesterol diet, and weight control. Please remember to sign up for My Chart at your earliest convenience, as this will be important to you in the future with finding out test results. Please return in 6 mo with Lab testing done 3-5 days before

## 2012-06-11 ENCOUNTER — Encounter: Payer: Self-pay | Admitting: Internal Medicine

## 2012-06-11 NOTE — Assessment & Plan Note (Signed)
stable overall by hx and exam, most recent data reviewed with pt, and pt to continue medical treatment as before BP Readings from Last 3 Encounters:  06/06/12 120/64  02/20/12 120/54  02/20/12 120/54   Lab Results  Component Value Date   HGBA1C 6.6* 05/31/2012

## 2012-06-11 NOTE — Assessment & Plan Note (Signed)
stable overall by hx and exam, most recent data reviewed with pt, and pt to continue medical treatment as before Lab Results  Component Value Date   LDLCALC 44 05/31/2012

## 2012-06-11 NOTE — Assessment & Plan Note (Signed)
stable overall by hx and exam, most recent data reviewed with pt, and pt to continue medical treatment as before BP Readings from Last 3 Encounters:  06/06/12 120/64  02/20/12 120/54  02/20/12 120/54

## 2012-06-12 ENCOUNTER — Other Ambulatory Visit: Payer: Self-pay | Admitting: Internal Medicine

## 2012-06-13 NOTE — Telephone Encounter (Signed)
Faxed hardcopy to pharmacy. 

## 2012-06-13 NOTE — Telephone Encounter (Signed)
Done hardcopy to robin  

## 2012-06-29 ENCOUNTER — Other Ambulatory Visit: Payer: Self-pay | Admitting: Internal Medicine

## 2012-08-10 ENCOUNTER — Other Ambulatory Visit: Payer: Self-pay | Admitting: Internal Medicine

## 2012-08-11 ENCOUNTER — Emergency Department (HOSPITAL_COMMUNITY): Payer: Medicare Other

## 2012-08-11 ENCOUNTER — Encounter (HOSPITAL_COMMUNITY): Payer: Self-pay | Admitting: Emergency Medicine

## 2012-08-11 ENCOUNTER — Emergency Department (HOSPITAL_COMMUNITY)
Admission: EM | Admit: 2012-08-11 | Discharge: 2012-08-11 | Disposition: A | Discharge status: Home / Self Care | Payer: Medicare Other | Attending: Emergency Medicine | Admitting: Emergency Medicine

## 2012-08-11 DIAGNOSIS — R5381 Other malaise: Secondary | ICD-10-CM | POA: Insufficient documentation

## 2012-08-11 DIAGNOSIS — F329 Major depressive disorder, single episode, unspecified: Secondary | ICD-10-CM | POA: Insufficient documentation

## 2012-08-11 DIAGNOSIS — R5383 Other fatigue: Secondary | ICD-10-CM | POA: Insufficient documentation

## 2012-08-11 DIAGNOSIS — F411 Generalized anxiety disorder: Secondary | ICD-10-CM | POA: Insufficient documentation

## 2012-08-11 DIAGNOSIS — IMO0001 Reserved for inherently not codable concepts without codable children: Secondary | ICD-10-CM | POA: Insufficient documentation

## 2012-08-11 DIAGNOSIS — F3289 Other specified depressive episodes: Secondary | ICD-10-CM | POA: Insufficient documentation

## 2012-08-11 DIAGNOSIS — Z79899 Other long term (current) drug therapy: Secondary | ICD-10-CM | POA: Insufficient documentation

## 2012-08-11 DIAGNOSIS — I10 Essential (primary) hypertension: Secondary | ICD-10-CM | POA: Insufficient documentation

## 2012-08-11 DIAGNOSIS — Z8719 Personal history of other diseases of the digestive system: Secondary | ICD-10-CM | POA: Insufficient documentation

## 2012-08-11 DIAGNOSIS — R059 Cough, unspecified: Secondary | ICD-10-CM | POA: Insufficient documentation

## 2012-08-11 DIAGNOSIS — R05 Cough: Secondary | ICD-10-CM | POA: Insufficient documentation

## 2012-08-11 DIAGNOSIS — Z8739 Personal history of other diseases of the musculoskeletal system and connective tissue: Secondary | ICD-10-CM | POA: Insufficient documentation

## 2012-08-11 DIAGNOSIS — E785 Hyperlipidemia, unspecified: Secondary | ICD-10-CM | POA: Insufficient documentation

## 2012-08-11 DIAGNOSIS — E119 Type 2 diabetes mellitus without complications: Secondary | ICD-10-CM | POA: Insufficient documentation

## 2012-08-11 DIAGNOSIS — R509 Fever, unspecified: Secondary | ICD-10-CM | POA: Insufficient documentation

## 2012-08-11 DIAGNOSIS — R42 Dizziness and giddiness: Secondary | ICD-10-CM | POA: Insufficient documentation

## 2012-08-11 LAB — COMPREHENSIVE METABOLIC PANEL
ALT: 20 U/L (ref 0–35)
Alkaline Phosphatase: 61 U/L (ref 39–117)
CO2: 26 mEq/L (ref 19–32)
Chloride: 103 mEq/L (ref 96–112)
GFR calc Af Amer: >90 mL/min (ref >90)
GFR calc non Af Amer: 86 mL/min — ABNORMAL LOW (ref >90)
Glucose, Bld: 127 mg/dL — ABNORMAL HIGH (ref 70–99)
Potassium: 3.4 mEq/L — ABNORMAL LOW (ref 3.5–5.1)
Sodium: 141 mEq/L (ref 135–145)

## 2012-08-11 LAB — CBC
Hemoglobin: 11.4 g/dL — ABNORMAL LOW (ref 12.0–15.0)
RBC: 3.97 MIL/uL (ref 3.87–5.11)
WBC: 5.7 10*3/uL (ref 4.0–10.5)

## 2012-08-11 NOTE — ED Notes (Signed)
Patient advised she has had a cough x 1 wk.   Patient claims she has not gone to see her primary doctor.   Patient claims she has been taking tylenol at home for fever.   Patient claims that she came in today because of worsening cough.

## 2012-08-11 NOTE — ED Provider Notes (Signed)
Latasha Jakes, MD  I saw and evaluated the patient, reviewed the resident's note and I agree with the findings and plan.  Results for orders placed during the hospital encounter of 08/11/12  CBC      Result Value Range   WBC 5.7  4.0 - 10.5 K/uL   RBC 3.97  3.87 - 5.11 MIL/uL   Hemoglobin 11.4 (*) 12.0 - 15.0 g/dL   HCT 16.1 (*) 09.6 - 04.5 %   MCV 87.9  78.0 - 100.0 fL   MCH 28.7  26.0 - 34.0 pg   MCHC 32.7  30.0 - 36.0 g/dL   RDW 40.9  81.1 - 91.4 %   Platelets 184  150 - 400 K/uL  COMPREHENSIVE METABOLIC PANEL      Result Value Range   Sodium 141  135 - 145 mEq/L   Potassium 3.4 (*) 3.5 - 5.1 mEq/L   Chloride 103  96 - 112 mEq/L   CO2 26  19 - 32 mEq/L   Glucose, Bld 127 (*) 70 - 99 mg/dL   BUN 10  6 - 23 mg/dL   Creatinine, Ser 7.82  0.50 - 1.10 mg/dL   Calcium 9.2  8.4 - 95.6 mg/dL   Total Protein 7.4  6.0 - 8.3 g/dL   Albumin 3.9  3.5 - 5.2 g/dL   AST 33  0 - 37 U/L   ALT 20  0 - 35 U/L   Alkaline Phosphatase 61  39 - 117 U/L   Total Bilirubin 1.5 (*) 0.3 - 1.2 mg/dL   GFR calc non Af Amer 86 (*) >90 mL/min   GFR calc Af Amer >90  >90 mL/min  PRO B NATRIURETIC PEPTIDE      Result Value Range   Pro B Natriuretic peptide (BNP) 58.4  0 - 125 pg/mL    Results for orders placed during the hospital encounter of 08/11/12  CBC      Result Value Range   WBC 5.7  4.0 - 10.5 K/uL   RBC 3.97  3.87 - 5.11 MIL/uL   Hemoglobin 11.4 (*) 12.0 - 15.0 g/dL   HCT 21.3 (*) 08.6 - 57.8 %   MCV 87.9  78.0 - 100.0 fL   MCH 28.7  26.0 - 34.0 pg   MCHC 32.7  30.0 - 36.0 g/dL   RDW 46.9  62.9 - 52.8 %   Platelets 184  150 - 400 K/uL  COMPREHENSIVE METABOLIC PANEL      Result Value Range   Sodium 141  135 - 145 mEq/L   Potassium 3.4 (*) 3.5 - 5.1 mEq/L   Chloride 103  96 - 112 mEq/L   CO2 26  19 - 32 mEq/L   Glucose, Bld 127 (*) 70 - 99 mg/dL   BUN 10  6 - 23 mg/dL   Creatinine, Ser 4.13  0.50 - 1.10 mg/dL   Calcium 9.2  8.4 - 24.4 mg/dL   Total Protein 7.4  6.0 - 8.3 g/dL    Albumin 3.9  3.5 - 5.2 g/dL   AST 33  0 - 37 U/L   ALT 20  0 - 35 U/L   Alkaline Phosphatase 61  39 - 117 U/L   Total Bilirubin 1.5 (*) 0.3 - 1.2 mg/dL   GFR calc non Af Amer 86 (*) >90 mL/min   GFR calc Af Amer >90  >90 mL/min  PRO B NATRIURETIC PEPTIDE      Result Value Range   Pro  B Natriuretic peptide (BNP) 58.4  0 - 125 pg/mL   Dg Chest 2 View  08/11/2012  *RADIOLOGY REPORT*  Clinical Data: Cough and fever.  CHEST - 2 VIEW  Comparison: 02/19/2012.  Findings: Trachea is midline.  Heart is enlarged, stable.  Thoracic aorta is calcified.  Lungs are clear.  No pleural fluid.  IMPRESSION: No acute findings.   Original Report Authenticated By: Leanna Battles, M.D.     Results for orders placed during the hospital encounter of 08/11/12  CBC      Result Value Range   WBC 5.7  4.0 - 10.5 K/uL   RBC 3.97  3.87 - 5.11 MIL/uL   Hemoglobin 11.4 (*) 12.0 - 15.0 g/dL   HCT 84.6 (*) 96.2 - 95.2 %   MCV 87.9  78.0 - 100.0 fL   MCH 28.7  26.0 - 34.0 pg   MCHC 32.7  30.0 - 36.0 g/dL   RDW 84.1  32.4 - 40.1 %   Platelets 184  150 - 400 K/uL  COMPREHENSIVE METABOLIC PANEL      Result Value Range   Sodium 141  135 - 145 mEq/L   Potassium 3.4 (*) 3.5 - 5.1 mEq/L   Chloride 103  96 - 112 mEq/L   CO2 26  19 - 32 mEq/L   Glucose, Bld 127 (*) 70 - 99 mg/dL   BUN 10  6 - 23 mg/dL   Creatinine, Ser 0.27  0.50 - 1.10 mg/dL   Calcium 9.2  8.4 - 25.3 mg/dL   Total Protein 7.4  6.0 - 8.3 g/dL   Albumin 3.9  3.5 - 5.2 g/dL   AST 33  0 - 37 U/L   ALT 20  0 - 35 U/L   Alkaline Phosphatase 61  39 - 117 U/L   Total Bilirubin 1.5 (*) 0.3 - 1.2 mg/dL   GFR calc non Af Amer 86 (*) >90 mL/min   GFR calc Af Amer >90  >90 mL/min  PRO B NATRIURETIC PEPTIDE      Result Value Range   Pro B Natriuretic peptide (BNP) 58.4  0 - 125 pg/mL   Dg Chest 2 View  08/11/2012  *RADIOLOGY REPORT*  Clinical Data: Cough and fever.  CHEST - 2 VIEW  Comparison: 02/19/2012.  Findings: Trachea is midline.  Heart is  enlarged, stable.  Thoracic aorta is calcified.  Lungs are clear.  No pleural fluid.  IMPRESSION: No acute findings.   Original Report Authenticated By: Leanna Battles, M.D.     Chest raise not consistent with pneumonia. Patient chief complaint of cough but not evident that there is any significant or persistent cough here in the emergency department. Patient is nontoxic no acute distress can be discharged home with symptomatic treatment. No significant leukocytosis no significant electrolyte abnormalities. Patient's BNP was normal.     Latasha Jakes, MD 08/11/12 [phone]

## 2012-08-11 NOTE — Discharge Instructions (Signed)

## 2012-08-11 NOTE — ED Provider Notes (Signed)
History     CSN: [id number]  Arrival date & time 08/11/12  4098   First MD Initiated Contact with Patient 08/11/12 [phone]      Chief Complaint  Patient presents with  . Cough  . Fever    HPI Comments: Pt is a 72 yo F with PMH of HTN, DM and chronic vertigo presenting with 1 week history of progressively worsening, nonproductive cough. She states she has some nasal congestion, fevers and dizziness as well. She has tried Tylenol at home which does not help. She denies SOB or CP, but ambulating makes her cough worse.   Patient is a 72 y.o. female presenting with cough and fever. The history is provided by the patient.  Cough Cough characteristics:  Non-productive, dry and hacking Severity:  Moderate Onset quality:  Gradual Duration:  7 days Timing:  Constant Progression:  Worsening Chronicity:  New Smoker: no   Context: not sick contacts and not smoke exposure   Relieved by:  Nothing Worsened by:  Activity Ineffective treatments:  None tried Associated symptoms: fever and myalgias   Associated symptoms: no chest pain, no chills, no sinus congestion and no sore throat   Risk factors: no chemical exposure and no recent infection (other than URI-like symptoms associated with cough)   Fever Associated symptoms: cough and myalgias   Associated symptoms: no chest pain, no chills and no sore throat      Past Medical History  Diagnosis Date  . ANXIETY 06/20/2008  . DEPRESSION 05/09/2007  . FATTY LIVER DISEASE 09/01/2007  . Gastroparesis 09/01/2007  . GERD 05/09/2007  . HYPERLIPIDEMIA 07/05/2007  . HYPERTENSION 05/09/2007  . LATERAL EPICONDYLITIS, RIGHT 12/17/2008  . LIVER FUNCTION TESTS, ABNORMAL 07/19/2007  . MYALGIA 05/09/2007  . Other malaise and fatigue 07/05/2007  . VERTIGO, CHRONIC 09/01/2007  . Diabetes mellitus     Past Surgical History  Procedure Laterality Date  . Cataract extraction, bilateral    . Total elbow arthroplasty  02/19/2012    Procedure: TOTAL ELBOW  ARTHROPLASTY;  Surgeon: Budd Palmer, MD;  Location: [facility] OR;  Service: Orthopedics;  Laterality: Left;    No family history on file.  History  Substance Use Topics  . Smoking status: Never Smoker   . Smokeless tobacco: Not on file  . Alcohol Use: No    OB History   Grav Para Term Preterm Abortions TAB SAB Ect Mult Living                  Review of Systems  Constitutional: Positive for fever. Negative for chills.  HENT: Negative for sore throat.   Respiratory: Positive for cough.   Cardiovascular: Negative for chest pain.  Musculoskeletal: Positive for myalgias.  Neurological: Positive for light-headedness.  All other systems reviewed and are negative.    Allergies  Review of patient's allergies indicates no known allergies.  Home Medications   Current Outpatient Rx  Name  Route  Sig  Dispense  Refill  . acetaminophen (TYLENOL) 500 MG tablet   Oral   Take 1-2 tablets (500-1,000 mg total) by mouth every 6 (six) hours as needed for pain or fever. For pain   90 tablet   0   . amLODipine (NORVASC) 10 MG tablet   Oral   Take 10 mg by mouth daily.         . clonazePAM (KLONOPIN) 0.5 MG tablet   Oral   Take 0.5 mg by mouth 2 (two) times daily.         Marland Kitchen  meclizine (ANTIVERT) 12.5 MG tablet   Oral   Take 12.5-25 mg by mouth every 6 (six) hours as needed. For dizziness         . metFORMIN (GLUCOPHAGE-XR) 500 MG 24 hr tablet   Oral   Take 500 mg by mouth 2 (two) times daily.         . metFORMIN (GLUMETZA) 500 MG (MOD) 24 hr tablet   Oral   Take 500 mg by mouth daily with breakfast.         . olanzapine-FLUoxetine (SYMBYAX) 6-50 MG per capsule   Oral   Take 1 capsule by mouth daily.          . pioglitazone (ACTOS) 45 MG tablet   Oral   Take 1 tablet (45 mg total) by mouth daily.   90 tablet   3   . rosuvastatin (CRESTOR) 10 MG tablet   Oral   Take 10 mg by mouth daily.         . valsartan (DIOVAN) 320 MG tablet   Oral   Take 320 mg by  mouth daily.           BP 137/86  Pulse 92  Temp(Src) 98.6 F (37 C) (Oral)  Resp 16  Ht 4\' 9"  (1.448 m)  Wt 150 lb (68.04 kg)  BMI 32.45 kg/m2  SpO2 99%  Physical Exam  Constitutional: She is oriented to person, place, and time. She appears well-developed and well-nourished.  Appears uncomfortable, lying on left side in stretcher  HENT:  Head: Normocephalic and atraumatic.  Mouth/Throat: No oropharyngeal exudate.  Cardiovascular: Normal rate, regular rhythm and normal heart sounds.   Pulmonary/Chest: Effort normal. No respiratory distress. She has no wheezes. She has rales (Lower fields, R>L).  Abdominal: Soft. There is no tenderness.  Musculoskeletal: Normal range of motion. She exhibits no edema.  Lymphadenopathy:    She has no cervical adenopathy.  Neurological: She is alert and oriented to person, place, and time.  Skin: Skin is warm and dry. No rash noted.    ED Course  Procedures (including critical care time)  Labs Reviewed  CBC - Abnormal; Notable for the following:    Hemoglobin 11.4 (*)    HCT 34.9 (*)    All other components within normal limits  COMPREHENSIVE METABOLIC PANEL - Abnormal; Notable for the following:    Potassium 3.4 (*)    Glucose, Bld 127 (*)    Total Bilirubin 1.5 (*)    GFR calc non Af Amer 86 (*)    All other components within normal limits  PRO B NATRIURETIC PEPTIDE   Dg Chest 2 View  08/11/2012  *RADIOLOGY REPORT*  Clinical Data: Cough and fever.  CHEST - 2 VIEW  Comparison: 02/19/2012.  Findings: Trachea is midline.  Heart is enlarged, stable.  Thoracic aorta is calcified.  Lungs are clear.  No pleural fluid.  IMPRESSION: No acute findings.   Original Report Authenticated By: Leanna Battles, M.D.     1. Post-viral cough syndrome     MDM  72 yo F presenting with progressively worsening nonproductive cough  Given her age and risk factors, as well as reported fever, will check CBC, Cmet and CXR. Pt did have some rales at bases.  Although she does not have a known CHF history or leg edema on exam, will check Pro-BNP given her history of HTN.   0958- Labs and Xray reviewed. No sign of pneumonia or other infection. Most likely cough is due to  viral illness and should resolve with symptomatic care. Will d/c home in stable condition.      Hilarie Fredrickson, MD 08/11/12 1013

## 2012-08-12 ENCOUNTER — Ambulatory Visit (INDEPENDENT_AMBULATORY_CARE_PROVIDER_SITE_OTHER): Payer: Medicare Other | Admitting: Internal Medicine

## 2012-08-12 ENCOUNTER — Encounter: Payer: Self-pay | Admitting: Internal Medicine

## 2012-08-12 VITALS — BP 120/70 | HR 66 | Temp 98.5°F

## 2012-08-12 DIAGNOSIS — B9789 Other viral agents as the cause of diseases classified elsewhere: Secondary | ICD-10-CM

## 2012-08-12 DIAGNOSIS — J9801 Acute bronchospasm: Secondary | ICD-10-CM

## 2012-08-12 DIAGNOSIS — B349 Viral infection, unspecified: Secondary | ICD-10-CM

## 2012-08-12 DIAGNOSIS — J069 Acute upper respiratory infection, unspecified: Secondary | ICD-10-CM

## 2012-08-12 DIAGNOSIS — E119 Type 2 diabetes mellitus without complications: Secondary | ICD-10-CM

## 2012-08-12 MED ORDER — PREDNISONE (PAK) 10 MG PO TABS: 10.0000 mg | ORAL_TABLET | ORAL | Status: DC

## 2012-08-12 MED ORDER — ALBUTEROL SULFATE HFA 108 (90 BASE) MCG/ACT IN AERS: 2.0000 | INHALATION_SPRAY | Freq: Four times a day (QID) | RESPIRATORY_TRACT | Status: DC | PRN

## 2012-08-12 MED ORDER — PROMETHAZINE-PHENYLEPHRINE 6.25-5 MG/5ML PO SYRP: 5.0000 mL | ORAL_SOLUTION | ORAL | Status: DC | PRN

## 2012-08-12 NOTE — Patient Instructions (Signed)
It was good to see you today. We have reviewed your ER records from yesterday's visit including labs and tests today -everything checked normal, no pneumonia If you develop worsening symptoms or fever, call and we can reconsider antibiotics, but it does not appear necessary to use antibiotics at this time. Per for the next 6 days, new prescription cough syrup every 6 hours as needed and albuterol inhaler every 4 hours as needed for cough or wheeze Your prescription(s) have been submitted to your pharmacy. Please take as directed and contact our office if you believe you are having problem(s) with the medication(s). Alternate between ibuprofen and tylenol for aches, pain and fever symptoms as discussed Call if symptoms unimproved in the next 7 days, sooner if worse Bronchospasm A bronchospasm is when the tubes that carry air in and out of your lungs (bronchioles) become smaller. It is hard to breathe when this happens. A bronchospasm can be caused by:  Asthma.  Allergies.  Lung infection. HOME CARE   Do not  smoke. Avoid places that have secondhand smoke.  Dust your house often. Have your air ducts cleaned once or twice a year.  Find out what allergies may cause your bronchospasms.  Use your inhaler properly if you have one. Know when to use it.  Eat healthy foods and drink plenty of water.  Only take medicine as told by your doctor. GET HELP RIGHT AWAY IF:  You feel you cannot breathe or catch your breath.  You cannot stop coughing.  Your treatment is not helping you breathe better. MAKE SURE YOU:   Understand these instructions.  Will watch your condition.  Will get help right away if you are not doing well or get worse. Document Released: 04/05/2009 Document Revised: 08/31/2011 Document Reviewed: 04/05/2009 [facility] Patient Information 2013 [place], [place]. Antibiotic Nonuse  Your caregiver felt that the infection or problem was not one that would be helped with an  antibiotic. Infections may be caused by viruses or bacteria. Only a caregiver can tell which one of these is the likely cause of an illness. A cold is the most common cause of infection in both adults and children. A cold is a virus. Antibiotic treatment will have no effect on a viral infection. Viruses can lead to many lost days of work caring for sick children and many missed days of school. Children may catch as many as 10 "colds" or "flus" per year during which they can be tearful, cranky, and uncomfortable. The goal of treating a virus is aimed at keeping the ill person comfortable. Antibiotics are medications used to help the body fight bacterial infections. There are relatively few types of bacteria that cause infections but there are hundreds of viruses. While both viruses and bacteria cause infection they are very different types of germs. A viral infection will typically go away by itself within 7 to 10 days. Bacterial infections may spread or get worse without antibiotic treatment. Examples of bacterial infections are:  Sore throats (like strep throat or tonsillitis).  Infection in the lung (pneumonia).  Ear and skin infections. Examples of viral infections are:  Colds or flus.  Most coughs and bronchitis.  Sore throats not caused by Strep.  Runny noses. It is often best not to take an antibiotic when a viral infection is the cause of the problem. Antibiotics can kill off the helpful bacteria that we have inside our body and allow harmful bacteria to start growing. Antibiotics can cause side effects such as allergies,  nausea, and diarrhea without helping to improve the symptoms of the viral infection. Additionally, repeated uses of antibiotics can cause bacteria inside of our body to become resistant. That resistance can be passed onto harmful bacterial. The next time you have an infection it may be harder to treat if antibiotics are used when they are not needed. Not treating with  antibiotics allows our own immune system to develop and take care of infections more efficiently. Also, antibiotics will work better for [place] when they are prescribed for bacterial infections. Treatments for a child that is ill may include:  Give extra fluids throughout the day to stay hydrated.  Get plenty of rest.  Only give your child over-the-counter or prescription medicines for pain, discomfort, or fever as directed by your caregiver.  The use of a cool mist humidifier may help stuffy noses.  Cold medications if suggested by your caregiver. Your caregiver may decide to start you on an antibiotic if:  The problem you were seen for today continues for a longer length of time than expected.  You develop a secondary bacterial infection. SEEK MEDICAL CARE IF:  Fever lasts longer than 5 days.  Symptoms continue to get worse after 5 to 7 days or become severe.  Difficulty in breathing develops.  Signs of dehydration develop (poor drinking, rare urinating, dark colored urine).  Changes in behavior or worsening tiredness (listlessness or lethargy). Document Released: 08/17/2001 Document Revised: 08/31/2011 Document Reviewed: 02/13/2009 [facility] Patient Information 2013 [place], [place].

## 2012-08-12 NOTE — Assessment & Plan Note (Signed)
patient advised on anticipated increase in cbgs due to steroid effect for next several days - pt will call if cbg>200 The current medical regimen is effective;  continue present plan and medications.  Lab Results  Component Value Date   HGBA1C 6.6* 05/31/2012

## 2012-08-12 NOTE — Progress Notes (Signed)
  Subjective:    Patient ID: Latasha Wang, female    DOB: Dec 09, 1940, 72 y.o.   MRN: [id number]  HPI Comments: Reviewed ER evaluation yesterday for same symptoms: normal chest x-ray and normal CBC, symptomatic care advised for post viral syndrome cough  Cough This is a new problem. The current episode started 1 to 4 weeks ago. The problem has been unchanged. The problem occurs hourly. The cough is non-productive. Associated symptoms include chills, ear congestion, headaches, nasal congestion and wheezing. Pertinent negatives include no fever, heartburn, myalgias, postnasal drip, rash, rhinorrhea, sore throat, shortness of breath or weight loss. The symptoms are aggravated by cold air. She has tried prescription cough suppressant for the symptoms. The treatment provided mild relief. There is no history of asthma or COPD.  Fever  Associated symptoms include coughing, headaches and wheezing. Pertinent negatives include no rash or sore throat.   Past Medical History  Diagnosis Date  . ANXIETY   . DEPRESSION   . FATTY LIVER DISEASE   . Gastroparesis   . GERD   . HYPERLIPIDEMIA   . HYPERTENSION   . LATERAL EPICONDYLITIS, RIGHT   . Diabetes mellitus     Review of Systems  Constitutional: Positive for chills. Negative for fever and weight loss.  HENT: Negative for sore throat, rhinorrhea and postnasal drip.   Respiratory: Positive for cough and wheezing. Negative for shortness of breath.   Gastrointestinal: Negative for heartburn.  Musculoskeletal: Negative for myalgias.  Skin: Negative for rash.  Neurological: Positive for headaches.       Objective:   Physical Exam BP 120/70  Pulse 66  Temp(Src) 98.5 F (36.9 C) (Oral)  SpO2 95% General: Petite elderly Asian woman in no acute distress, granddaughter at side HEENT: Oropharynx mildly erythematous but no exudate or evidence for postnasal drainage Lungs: Bilateral rhonchi at bases. Soft expiratory wheezing left greater than right  side Cardiovascular: Regular rate rhythm  Lab Results  Component Value Date   WBC 5.7 08/11/2012   HGB 11.4* 08/11/2012   HCT 34.9* 08/11/2012   PLT 184 08/11/2012   GLUCOSE 127* 08/11/2012   CHOL 122 05/31/2012   TRIG 68.0 05/31/2012   HDL 64.80 05/31/2012   LDLDIRECT 85.1 08/19/2006   LDLCALC 44 05/31/2012   ALT 20 08/11/2012   AST 33 08/11/2012   NA 141 08/11/2012   K 3.4* 08/11/2012   CL 103 08/11/2012   CREATININE 0.70 08/11/2012   BUN 10 08/11/2012   CO2 26 08/11/2012   TSH 0.95 11/27/2011   INR 0.91 02/19/2012   HGBA1C 6.6* 05/31/2012   MICROALBUR 4.9* 11/27/2011   Dg Chest 2 View  08/11/2012  *RADIOLOGY REPORT*  Clinical Data: Cough and fever.  CHEST - 2 VIEW  Comparison: 02/19/2012.  Findings: Trachea is midline.  Heart is enlarged, stable.  Thoracic aorta is calcified.  Lungs are clear.  No pleural fluid.  IMPRESSION: No acute findings.   Original Report Authenticated By: Leanna Battles, M.D.       Assessment & Plan:  Acute bronchospasm from viral infection URI with cough and bronchospasm Type 2 diabetes, generally well controlled  We reviewed ER evaluation yesterday including chest x-ray, CBC and recommended treatment Patient denies symptom relief with Robitussin a.c. Change to promethazine with phenylephrine for antihistamine and decongestant Prednisone taper for acute bronchospasm Explained lack of antibiotics for viral disease, the patient to call if symptoms worse or unimproved greater than 7 days symptom duration

## 2012-08-16 ENCOUNTER — Other Ambulatory Visit: Payer: Self-pay | Admitting: *Deleted

## 2012-08-16 NOTE — Telephone Encounter (Signed)
Received fax pt needing PA on promethazine cough syrup. Contact insurance fax over PA form has been completed & fax back. Waiting on approval status...Raechel Chute

## 2012-08-17 ENCOUNTER — Other Ambulatory Visit: Payer: Self-pay | Admitting: Internal Medicine

## 2012-08-17 NOTE — Telephone Encounter (Signed)
Received call from insurance company PA for cough syrup has been approve. Notified [facility] spoke with Esmeralda Arthur gave approval status...Raechel Chute

## 2012-08-19 ENCOUNTER — Ambulatory Visit: Payer: Medicare Other | Admitting: Internal Medicine

## 2012-08-19 ENCOUNTER — Encounter: Payer: Self-pay | Admitting: Internal Medicine

## 2012-08-19 ENCOUNTER — Ambulatory Visit (INDEPENDENT_AMBULATORY_CARE_PROVIDER_SITE_OTHER): Payer: Medicare Other | Admitting: Internal Medicine

## 2012-08-19 VITALS — BP 100/62 | HR 73 | Temp 99.4°F

## 2012-08-19 DIAGNOSIS — J45901 Unspecified asthma with (acute) exacerbation: Secondary | ICD-10-CM

## 2012-08-19 DIAGNOSIS — I10 Essential (primary) hypertension: Secondary | ICD-10-CM

## 2012-08-19 DIAGNOSIS — E119 Type 2 diabetes mellitus without complications: Secondary | ICD-10-CM

## 2012-08-19 MED ORDER — HYDROCOD POLST-CHLORPHEN POLST 10-8 MG/5ML PO LQCR: 5.0000 mL | Freq: Two times a day (BID) | ORAL | Status: DC | PRN

## 2012-08-19 MED ORDER — AZITHROMYCIN 250 MG PO TABS: ORAL_TABLET | ORAL | Status: DC

## 2012-08-19 NOTE — Progress Notes (Signed)
  Subjective:    Patient ID: Latasha Wang, female    DOB: 06/10/1941, 72 y.o.   MRN: [id number]  HPI Complains of continued cough Somewhat improved since office visit last week for same: resolution of sore throat and headache Completed prednisone taper and using albuterol inhaler 4 times daily Continued low grade fever, occasional sputum  Past Medical History  Diagnosis Date  . ANXIETY   . DEPRESSION   . FATTY LIVER DISEASE   . Gastroparesis   . GERD   . HYPERLIPIDEMIA   . HYPERTENSION   . LATERAL EPICONDYLITIS, RIGHT   . Diabetes mellitus     Review of Systems  Constitutional: Positive for fatigue. Negative for appetite change.  Respiratory: Negative for shortness of breath and wheezing.   Cardiovascular: Negative for chest pain and palpitations.  Neurological: Negative for dizziness, weakness and headaches.       Objective:   Physical Exam BP 100/62  Pulse 73  Temp(Src) 99.4 F (37.4 C) (Oral)  SpO2 95% Wt Readings from Last 3 Encounters:  08/11/12 150 lb (68.04 kg)  06/06/12 163 lb 9 oz (74.191 kg)  02/19/12 164 lb 7.4 oz (74.6 kg)   General: Overweight, no acute distress and non-toxic but coughing. Daughter at side Lung: coarse sounds bilaterally with expiratory wheeze -component of vocal cord dysfunction appreciated Cardiovascular: Regular rate and rhythm, no edema  Lab Results  Component Value Date   WBC 5.7 08/11/2012   HGB 11.4* 08/11/2012   HCT 34.9* 08/11/2012   PLT 184 08/11/2012   GLUCOSE 127* 08/11/2012   CHOL 122 05/31/2012   TRIG 68.0 05/31/2012   HDL 64.80 05/31/2012   LDLDIRECT 85.1 08/19/2006   LDLCALC 44 05/31/2012   ALT 20 08/11/2012   AST 33 08/11/2012   NA 141 08/11/2012   K 3.4* 08/11/2012   CL 103 08/11/2012   CREATININE 0.70 08/11/2012   BUN 10 08/11/2012   CO2 26 08/11/2012   TSH 0.95 11/27/2011   INR 0.91 02/19/2012   HGBA1C 6.6* 05/31/2012   MICROALBUR 4.9* 11/27/2011       Assessment & Plan:  Acute asthma exacerbation with cough > 7  days low grade fever DM2  Improved but not resolved following tx of bronchospasm with pred taper and alb mdi prn  Add empiric antibiotics given duration >7 days and comorbid dz Change suppression to tussionex conitnue Alb mdi prn

## 2012-08-19 NOTE — Patient Instructions (Signed)
It was good to see you today. Zpak antibiotics and prescription cough syrup - Your prescription(s) have been submitted to your pharmacy. Please take as directed and contact our office if you believe you are having problem(s) with the medication(s). Alternate between ibuprofen and tylenol for aches, pain and fever symptoms as discussed Hydrate, rest and call if worse or unimproved Call if symptoms unimproved in the next 7 days, sooner if worse Bronchospasm A bronchospasm is when the tubes that carry air in and out of your lungs (bronchioles) become smaller. It is hard to breathe when this happens. A bronchospasm can be caused by:  Asthma.  Allergies.  Lung infection. HOME CARE   Do not  smoke. Avoid places that have secondhand smoke.  Dust your house often. Have your air ducts cleaned once or twice a year.  Find out what allergies may cause your bronchospasms.  Use your inhaler properly if you have one. Know when to use it.  Eat healthy foods and drink plenty of water.  Only take medicine as told by your doctor. GET HELP RIGHT AWAY IF:  You feel you cannot breathe or catch your breath.  You cannot stop coughing.  Your treatment is not helping you breathe better. MAKE SURE YOU:   Understand these instructions.  Will watch your condition.  Will get help right away if you are not doing well or get worse. Document Released: 04/05/2009 Document Revised: 08/31/2011 Document Reviewed: 04/05/2009 [facility] Patient Information 2013 [place], [place].

## 2012-08-19 NOTE — Assessment & Plan Note (Signed)
The current medical regimen is effective;  continue present plan and medications.  Lab Results  Component Value Date   HGBA1C 6.6* 05/31/2012

## 2012-08-19 NOTE — Assessment & Plan Note (Signed)
BP Readings from Last 3 Encounters:  08/19/12 100/62  08/12/12 120/70  08/11/12 140/76   The current medical regimen is effective;  continue present plan and medications.

## 2012-09-04 ENCOUNTER — Other Ambulatory Visit: Payer: Self-pay | Admitting: Internal Medicine

## 2012-09-13 ENCOUNTER — Telehealth: Payer: Self-pay | Admitting: Internal Medicine

## 2012-09-13 MED ORDER — IRBESARTAN 300 MG PO TABS: 300.0000 mg | ORAL_TABLET | Freq: Every day | ORAL | Status: DC

## 2012-09-13 NOTE — Telephone Encounter (Signed)
Pt called and stated that insurance will no longer cover for Diovan, pt was wondering if there is anything else that insurance will cover,something similar to diovan. Please advise.

## 2012-09-13 NOTE — Telephone Encounter (Signed)
Ok to try change to generic avapro 300 mg which should be similar

## 2012-09-14 NOTE — Telephone Encounter (Signed)
Called the patient left detailed message of medication change

## 2012-09-16 ENCOUNTER — Other Ambulatory Visit: Payer: Self-pay | Admitting: Internal Medicine

## 2012-09-16 NOTE — Telephone Encounter (Signed)
Done hardcopy to robin  

## 2012-09-16 NOTE — Telephone Encounter (Signed)
Faxed hardcopy to pharmacy. 

## 2012-10-02 ENCOUNTER — Other Ambulatory Visit: Payer: Self-pay | Admitting: Internal Medicine

## 2012-11-24 ENCOUNTER — Other Ambulatory Visit: Payer: Self-pay | Admitting: Internal Medicine

## 2012-11-24 NOTE — Telephone Encounter (Signed)
Faxed hardcopy to pharmacy. 

## 2012-11-24 NOTE — Telephone Encounter (Signed)
Done hardcopy to robin  

## 2012-12-01 ENCOUNTER — Other Ambulatory Visit (INDEPENDENT_AMBULATORY_CARE_PROVIDER_SITE_OTHER): Payer: Medicare Other

## 2012-12-01 DIAGNOSIS — E119 Type 2 diabetes mellitus without complications: Secondary | ICD-10-CM

## 2012-12-01 DIAGNOSIS — Z0000 Encounter for general adult medical examination without abnormal findings: Secondary | ICD-10-CM

## 2012-12-01 LAB — CBC WITH DIFFERENTIAL/PLATELET
Basophils Absolute: 0 K/uL (ref 0.0–0.1)
Basophils Relative: 0.4 % (ref 0.0–3.0)
Eosinophils Absolute: 0 K/uL (ref 0.0–0.7)
Eosinophils Relative: 0.7 % (ref 0.0–5.0)
HCT: 33.6 % — ABNORMAL LOW (ref 36.0–46.0)
Hemoglobin: 11.1 g/dL — ABNORMAL LOW (ref 12.0–15.0)
Lymphocytes Relative: 20.4 % (ref 12.0–46.0)
Lymphs Abs: 1.1 K/uL (ref 0.7–4.0)
MCHC: 33 g/dL (ref 30.0–36.0)
MCV: 89.4 fl (ref 78.0–100.0)
Monocytes Absolute: 0.3 K/uL (ref 0.1–1.0)
Monocytes Relative: 4.8 % (ref 3.0–12.0)
Neutro Abs: 3.9 K/uL (ref 1.4–7.7)
Neutrophils Relative %: 73.7 % (ref 43.0–77.0)
Platelets: 212 K/uL (ref 150.0–400.0)
RBC: 3.76 Mil/uL — ABNORMAL LOW (ref 3.87–5.11)
RDW: 15.6 % — ABNORMAL HIGH (ref 11.5–14.6)
WBC: 5.3 K/uL (ref 4.5–10.5)

## 2012-12-01 LAB — TSH: TSH: 1.49 u[IU]/mL (ref 0.35–5.50)

## 2012-12-01 LAB — URINALYSIS, ROUTINE W REFLEX MICROSCOPIC
RBC / HPF: NONE SEEN (ref 0–?)
Specific Gravity, Urine: 1.02 (ref 1.000–1.030)
Total Protein, Urine: NEGATIVE
Urine Glucose: NEGATIVE

## 2012-12-01 LAB — LIPID PANEL
Cholesterol: 123 mg/dL (ref 0–200)
HDL: 70.7 mg/dL (ref >39.00)
LDL Cholesterol: 34 mg/dL (ref 0–99)
Total CHOL/HDL Ratio: 2
Triglycerides: 93 mg/dL (ref 0.0–149.0)
VLDL: 18.6 mg/dL (ref 0.0–40.0)

## 2012-12-01 LAB — BASIC METABOLIC PANEL WITH GFR
BUN: 11 mg/dL (ref 6–23)
CO2: 27 meq/L (ref 19–32)
Calcium: 9.2 mg/dL (ref 8.4–10.5)
Chloride: 106 meq/L (ref 96–112)
Creatinine, Ser: 0.7 mg/dL (ref 0.4–1.2)
GFR: 93.83 mL/min (ref >60.00)
Glucose, Bld: 125 mg/dL — ABNORMAL HIGH (ref 70–99)
Potassium: 4 meq/L (ref 3.5–5.1)
Sodium: 143 meq/L (ref 135–145)

## 2012-12-01 LAB — HEPATIC FUNCTION PANEL
ALT: 57 U/L — ABNORMAL HIGH (ref 0–35)
AST: 50 U/L — ABNORMAL HIGH (ref 0–37)
Albumin: 3.7 g/dL (ref 3.5–5.2)
Alkaline Phosphatase: 68 U/L (ref 39–117)
Bilirubin, Direct: 0.3 mg/dL (ref 0.0–0.3)
Total Bilirubin: 1.8 mg/dL — ABNORMAL HIGH (ref 0.3–1.2)
Total Protein: 7 g/dL (ref 6.0–8.3)

## 2012-12-01 LAB — HEMOGLOBIN A1C: Hgb A1c MFr Bld: 6.5 % (ref 4.6–6.5)

## 2012-12-01 LAB — MICROALBUMIN / CREATININE URINE RATIO: Microalb Creat Ratio: 2.3 mg/g (ref 0.0–30.0)

## 2012-12-07 ENCOUNTER — Other Ambulatory Visit: Payer: Self-pay | Admitting: Internal Medicine

## 2012-12-08 ENCOUNTER — Ambulatory Visit: Payer: Medicare Other | Admitting: Internal Medicine

## 2012-12-12 ENCOUNTER — Encounter: Payer: Self-pay | Admitting: Internal Medicine

## 2012-12-12 ENCOUNTER — Ambulatory Visit (INDEPENDENT_AMBULATORY_CARE_PROVIDER_SITE_OTHER): Payer: Medicare Other | Admitting: Internal Medicine

## 2012-12-12 VITALS — BP 152/80 | HR 70 | Temp 99.8°F | Ht <= 58 in | Wt 158.0 lb

## 2012-12-12 DIAGNOSIS — E119 Type 2 diabetes mellitus without complications: Secondary | ICD-10-CM

## 2012-12-12 DIAGNOSIS — Z0000 Encounter for general adult medical examination without abnormal findings: Secondary | ICD-10-CM

## 2012-12-12 MED ORDER — HYDROCHLOROTHIAZIDE 12.5 MG PO CAPS: 12.5000 mg | ORAL_CAPSULE | Freq: Every day | ORAL | Status: DC

## 2012-12-12 NOTE — Assessment & Plan Note (Signed)

## 2012-12-12 NOTE — Assessment & Plan Note (Signed)
stable overall by history and exam, recent data reviewed with pt, and pt to continue medical treatment as before,  to f/u any worsening symptoms or concerns Lab Results  Component Value Date   HGBA1C 6.5 12/01/2012

## 2012-12-12 NOTE — Patient Instructions (Addendum)
Please take all new medication as prescribed - the low dose fluid pill for blood pressure Please continue all other medications as before, and refills have been done if requested. Please have the pharmacy call with any other refills you may need. Please continue your efforts at being more active, low cholesterol diet, and weight control. You are otherwise up to date with prevention measures today. Please keep your appointments with your specialists as you may have planned Please return in 6 months, or sooner if needed, with Lab testing done 3-5 days before

## 2012-12-12 NOTE — Progress Notes (Signed)
Subjective:    Patient ID: Latasha Wang, female    DOB: [birth year], 72 y.o.   MRN: [id number]  HPI  Here for wellness and f/u;  Overall doing ok;  Pt denies CP, worsening SOB, DOE, wheezing, orthopnea, PND, worsening LE edema, palpitations, dizziness or syncope.  Pt denies neurological change such as new headache, facial or extremity weakness.  Pt denies polydipsia, polyuria, or low sugar symptoms. Pt states overall good compliance with treatment and medications, good tolerability, and has been trying to follow lower cholesterol diet.  Pt denies worsening depressive symptoms, suicidal ideation or panic. No fever, night sweats, wt loss, loss of appetite, or other constitutional symptoms.  Pt states good ability with ADL's, has low fall risk, home safety reviewed and adequate, no other significant changes in hearing or vision, and only occasionally active with exercise.  No acute complaints Past Medical History  Diagnosis Date  . ANXIETY   . DEPRESSION   . FATTY LIVER DISEASE   . Gastroparesis   . GERD   . HYPERLIPIDEMIA   . HYPERTENSION   . LATERAL EPICONDYLITIS, RIGHT   . Diabetes mellitus    Past Surgical History  Procedure Laterality Date  . Cataract extraction, bilateral    . 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 She does not have any smokeless tobacco history on file. She reports that she does not drink alcohol or use illicit drugs. family history is not on file. No Known Allergies Current Outpatient Prescriptions on File Prior to Visit  Medication Sig Dispense Refill  . acetaminophen (TYLENOL) 500 MG tablet Take 1-2 tablets (500-1,000 mg total) by mouth every 6 (six) hours as needed for pain or fever. For pain  90 tablet  0  . albuterol (PROVENTIL HFA;VENTOLIN HFA) 108 (90 BASE) MCG/ACT inhaler Inhale 2 puffs into the  lungs every 6 (six) hours as needed for wheezing.  1 Inhaler  0  . amLODipine (NORVASC) 10 MG tablet Take 10 mg by mouth daily.      . chlorpheniramine-HYDROcodone (TUSSIONEX PENNKINETIC ER) 10-8 MG/5ML LQCR Take 5 mLs by mouth every 12 (twelve) hours as needed (cough).  140 mL  0  . clonazePAM (KLONOPIN) 0.5 MG tablet Take 1 tablet (0.5 mg total) by mouth 2 (two) times daily as needed for anxiety.  60 tablet  2  . CRESTOR 10 MG tablet TAKE 1 TABLET (10 MG TOTAL) BY MOUTH DAILY.  90 tablet  3  . irbesartan (AVAPRO) 300 MG tablet Take 1 tablet (300 mg total) by mouth at bedtime.  90 tablet  2  . meclizine (ANTIVERT) 12.5 MG tablet Take 12.5-25 mg by mouth every 6 (six) hours as needed. For dizziness      . metFORMIN (GLUCOPHAGE-XR) 500 MG 24 hr tablet Take 500 mg by mouth 2 (two) times daily.      . metFORMIN (GLUMETZA) 500 MG (MOD) 24 hr tablet Take 500 mg by mouth daily with breakfast.      . olanzapine-FLUoxetine (SYMBYAX) 6-50 MG per capsule Take 1 capsule by mouth daily.       Marland Kitchen OLANZapine-FLUoxetine (SYMBYAX) 6-50 MG per capsule TAKE 1 CAPSULE BY MOUTH DAILY.  30 capsule  10  . pioglitazone (ACTOS) 45 MG tablet Take 1 tablet (45 mg total) by mouth daily.  90 tablet  3  .  rosuvastatin (CRESTOR) 10 MG tablet Take 10 mg by mouth daily.       No current facility-administered medications on file prior to visit.   Review of Systems Constitutional: Negative for diaphoresis, activity change, appetite change or unexpected weight change.  HENT: Negative for hearing loss, ear pain, facial swelling, mouth sores and neck stiffness.   Eyes: Negative for pain, redness and visual disturbance.  Respiratory: Negative for shortness of breath and wheezing.   Cardiovascular: Negative for chest pain and palpitations.  Gastrointestinal: Negative for diarrhea, blood in stool, abdominal distention or other pain Genitourinary: Negative for hematuria, flank pain or change in urine volume.  Musculoskeletal:  Negative for myalgias and joint swelling.  Skin: Negative for color change and wound.  Neurological: Negative for syncope and numbness. other than noted Hematological: Negative for adenopathy.  Psychiatric/Behavioral: Negative for hallucinations, self-injury, decreased concentration and agitation.      Objective:   Physical Exam BP 152/80  Pulse 70  Temp(Src) 99.8 F (37.7 C) (Oral)  Ht 4\' 9"  (1.448 m)  Wt 158 lb (71.668 kg)  BMI 34.18 kg/m2  SpO2 96% VS noted,  Constitutional: Pt is oriented to person, place, and time. Appears well-developed and well-nourished.  Head: Normocephalic and atraumatic.  Right Ear: External ear normal.  Left Ear: External ear normal.  Nose: Nose normal.  Mouth/Throat: Oropharynx is clear and moist.  Eyes: Conjunctivae and EOM are normal. Pupils are equal, round, and reactive to light.  Neck: Normal range of motion. Neck supple. No JVD present. No tracheal deviation present.  Cardiovascular: Normal rate, regular rhythm, normal heart sounds and intact distal pulses.   Pulmonary/Chest: Effort normal and breath sounds normal.  Abdominal: Soft. Bowel sounds are normal. There is no tenderness. No HSM  Musculoskeletal: Normal range of motion. Exhibits no edema.  Lymphadenopathy:  Has no cervical adenopathy.  Neurological: Pt is alert and oriented to person, place, and time. Pt has normal reflexes. No cranial nerve deficit.  Skin: Skin is warm and dry. No rash noted.  Psychiatric:  Has  normal mood and affect. Behavior is normal.     Assessment & Plan:

## 2012-12-16 IMAGING — CR DG CHEST 1V PORT
1 series · 1 of 1 positions shown · non-contrast
Comparison: None.

CLINICAL DATA: Dizziness and cough.  Hypertension.  Diabetic.

PORTABLE CHEST - 1 VIEW

[view not recorded]
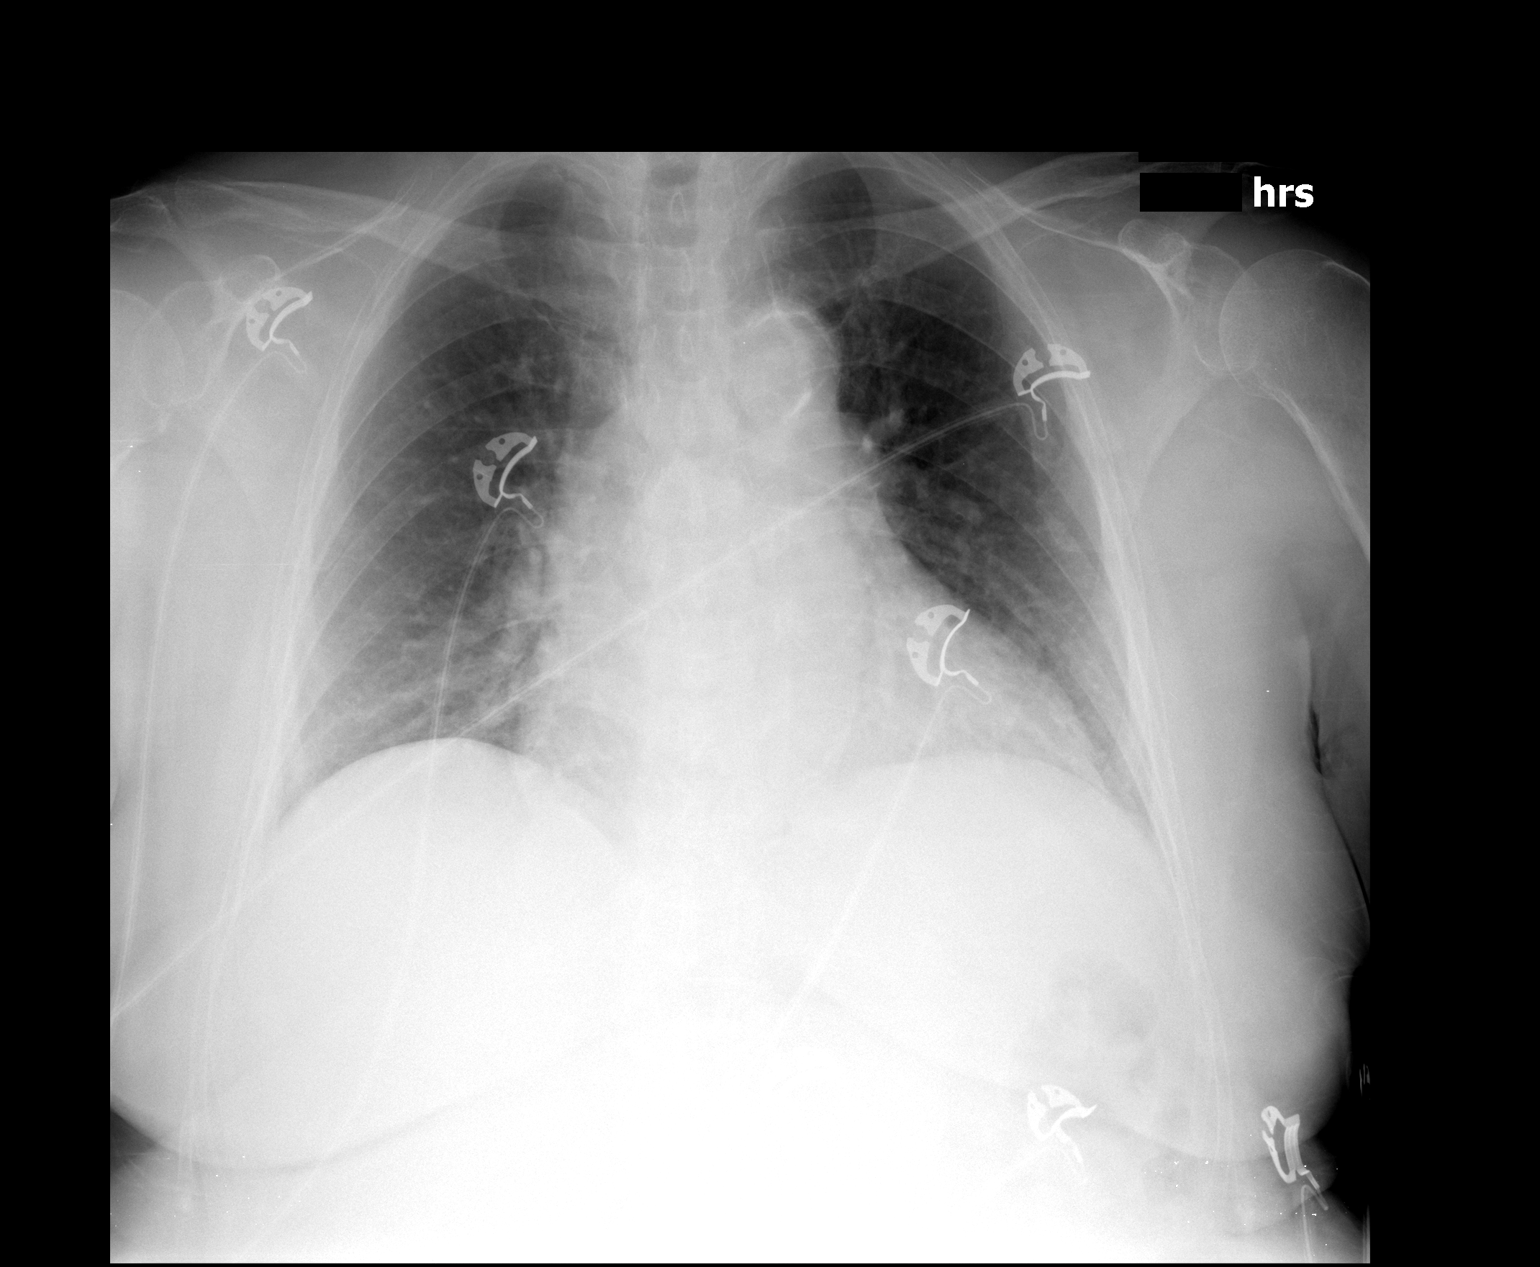

[1 of 1 positions shown; findings below may reference images not displayed]

FINDINGS: Midline trachea.  Cardiomegaly accentuated by AP portable
technique.  Atherosclerosis in the transverse aorta. No pleural
effusion or pneumothorax.  No congestive failure.

Low lung volumes with resultant pulmonary interstitial prominence.
Clear lungs.
IMPRESSION: Mild cardiomegaly and low lung volumes. No acute findings.

## 2012-12-16 IMAGING — CT CT HEAD W/O CM
1 series · 16 of 30 positions shown, 20 images · non-contrast
Comparison: MR of the brain 03/09/2007.  Head CT 03/09/2007.

CLINICAL DATA: Dizziness for over a month.  History of hypertension
and diabetes.

CT HEAD WITHOUT CONTRAST
TECHNIQUE: Contiguous axial images were obtained from the base of
the skull through the vertex without contrast.

[Series 2: head routine 4.8 h37s · axial · 0.43mm/px · z∈[-95,+65]mm · 16 of 36 slices shown, 20 images]
[im 2/36  brain]
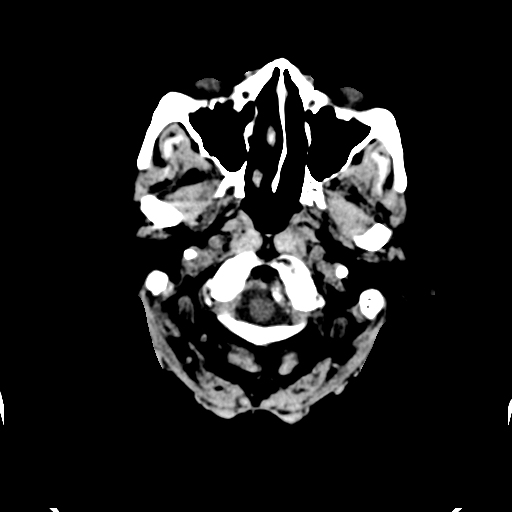
[im 2/36  bone]
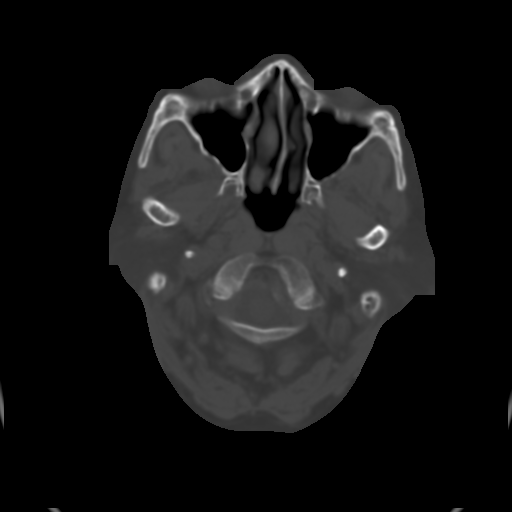
[im 4/36  brain]
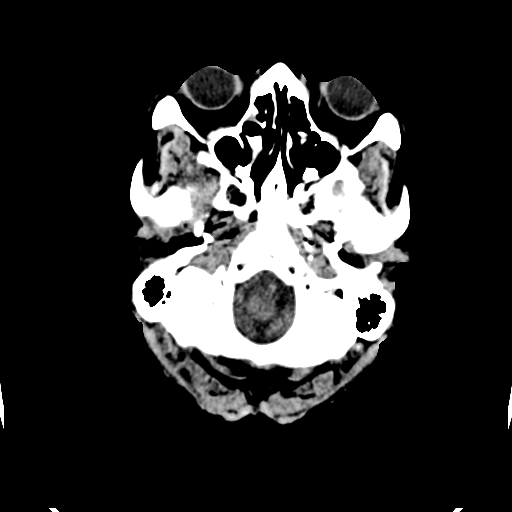
[im 7/36  brain]
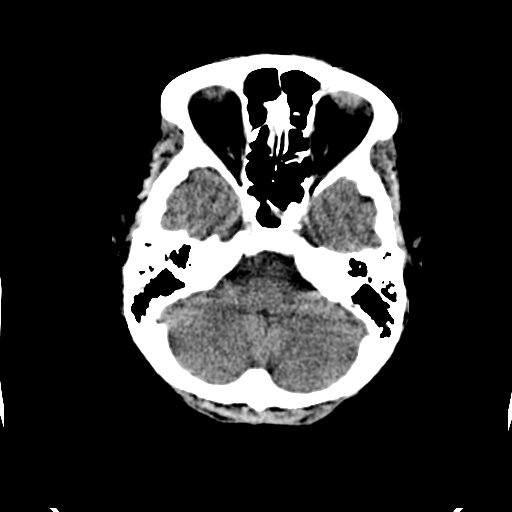
[im 9/36  brain]
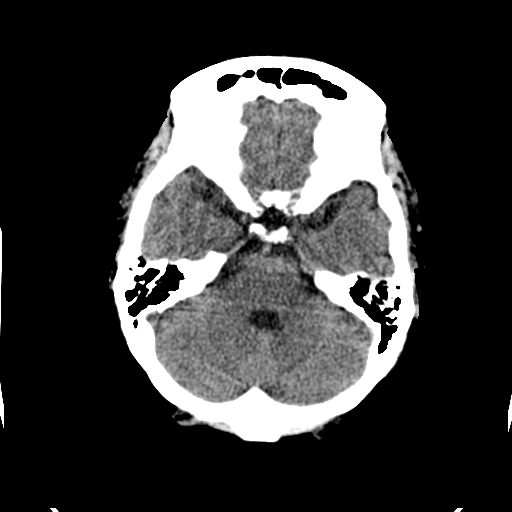
[im 10/36  brain]
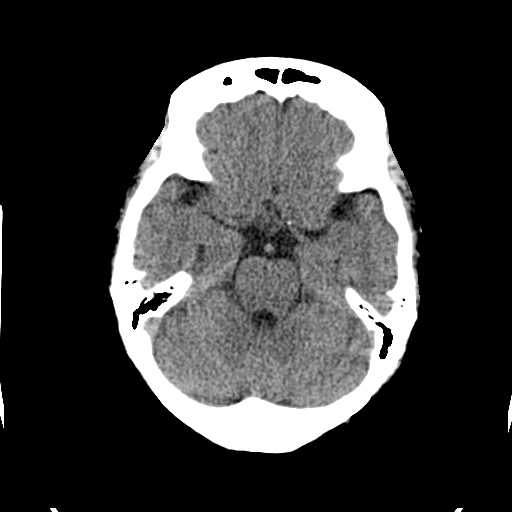
[im 10/36  bone]
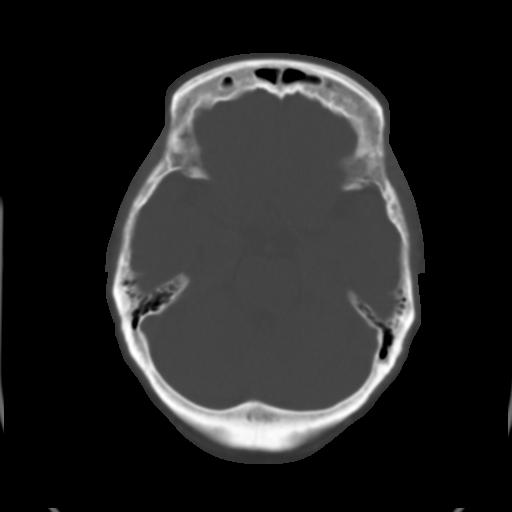
[im 13/36  brain]
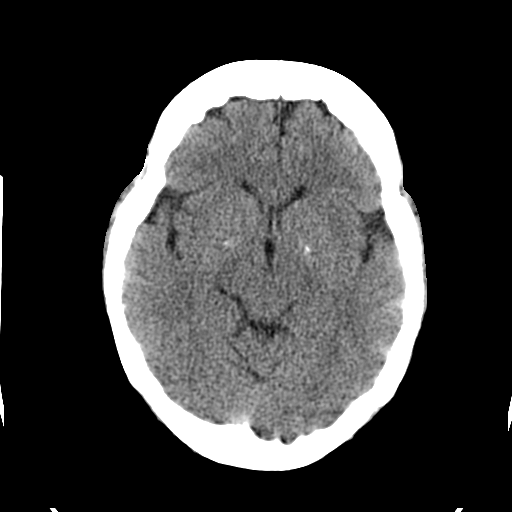
[im 15/36  brain]
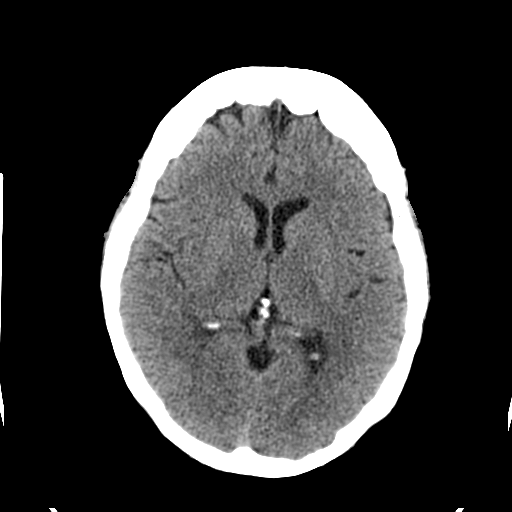
[im 17/36  brain]
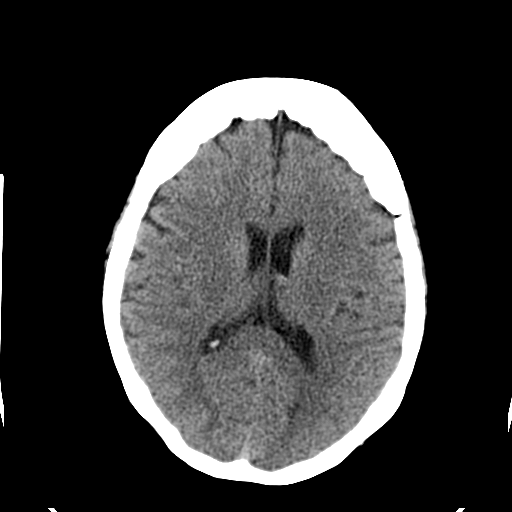
[im 19/36  brain]
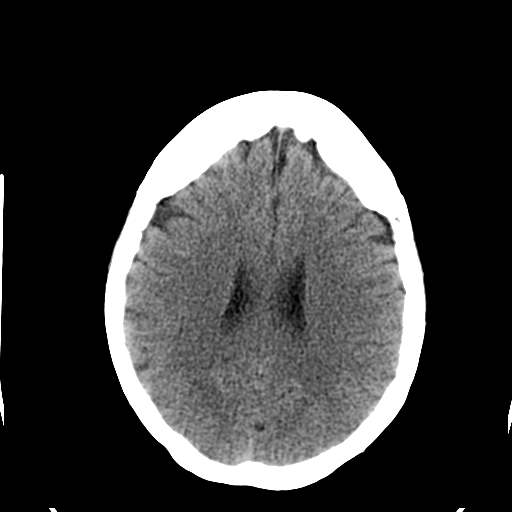
[im 19/36  bone]
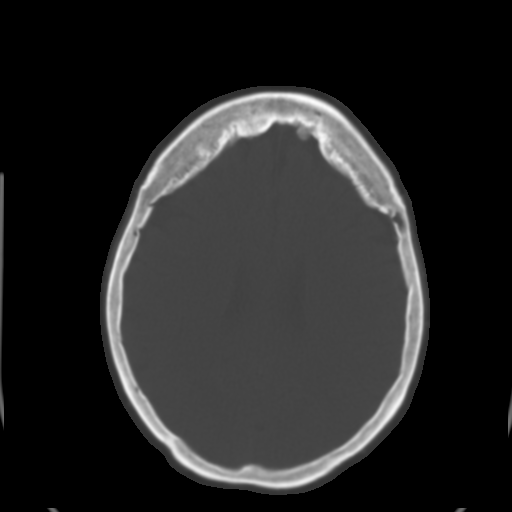
[im 21/36  brain]
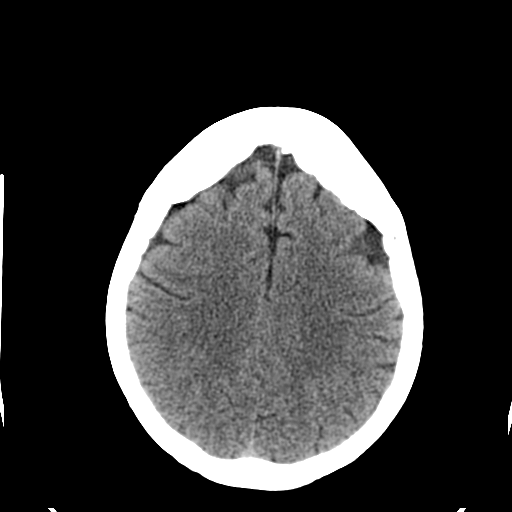
[im 23/36  brain]
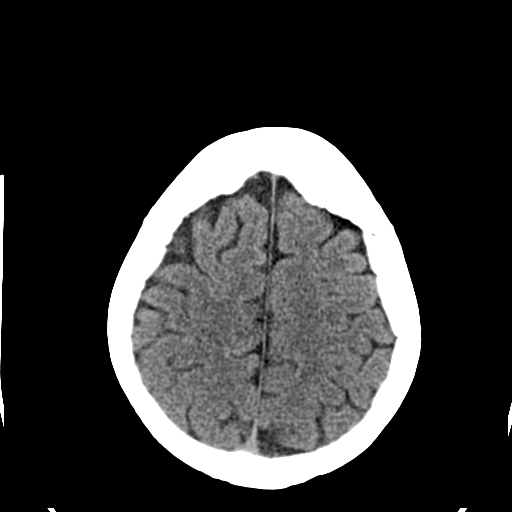
[im 26/36  brain]
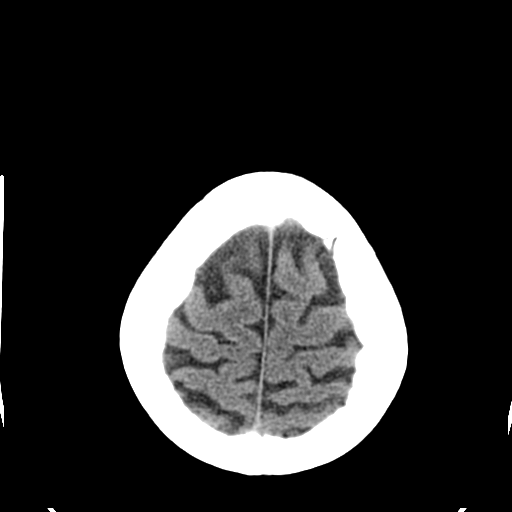
[im 27/36  brain]
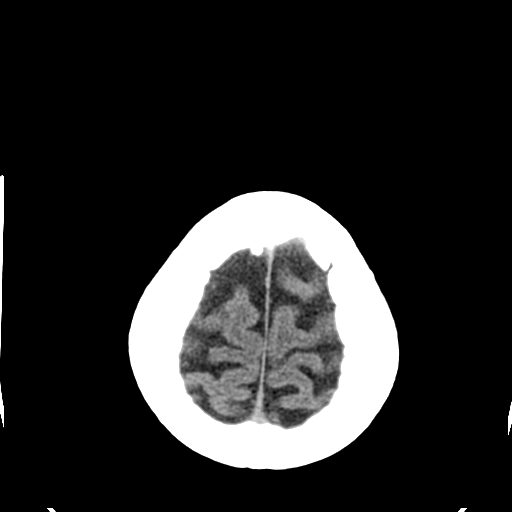
[im 27/36  bone]
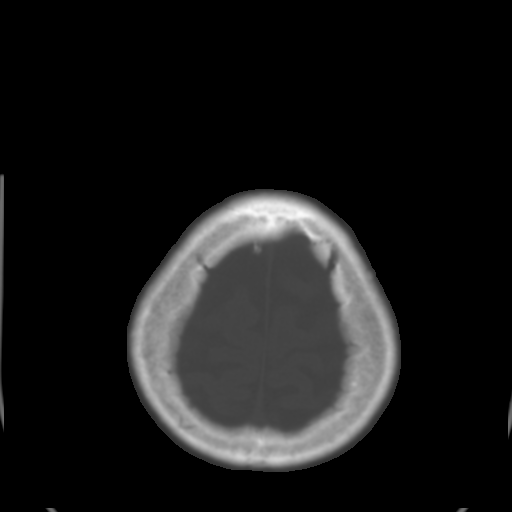
[im 29/36  brain]
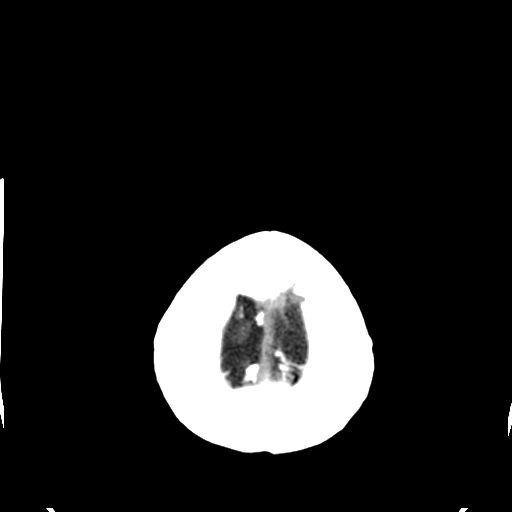
[im 32/36  brain]
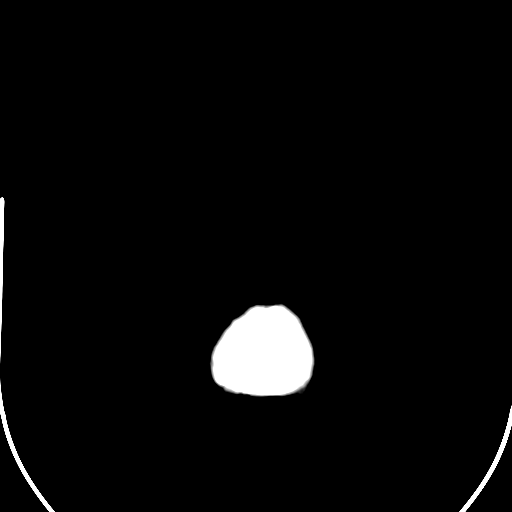
[im 34/36  brain]
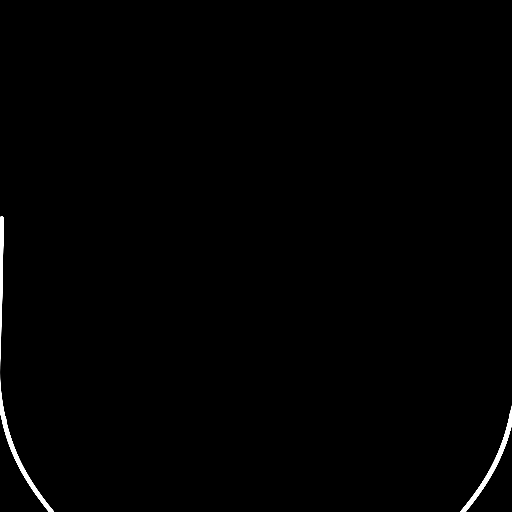

[16 of 30 positions shown; findings below may reference images not displayed]

FINDINGS: There is no evidence of acute intracranial hemorrhage,
mass lesion, brain edema or extra-axial fluid collection.  The
ventricles and subarachnoid spaces are appropriately sized for age.
There is no CT evidence of acute cortical infarction.

There is mild ethmoid sinus mucosal thickening.  The visualized
paranasal sinuses are otherwise clear.  The mastoids and middle
ears are clear. The calvarium is intact with frontal calvarial
hyperostosis.
IMPRESSION: No acute intracranial findings.  Mild ethmoid sinus mucosal
thickening.

## 2013-01-28 ENCOUNTER — Other Ambulatory Visit: Payer: Self-pay | Admitting: Internal Medicine

## 2013-02-26 ENCOUNTER — Other Ambulatory Visit: Payer: Self-pay | Admitting: Internal Medicine

## 2013-02-27 NOTE — Telephone Encounter (Signed)
Ok to refill? Last OV 6.23.14 Last filled 6.5.14

## 2013-02-28 NOTE — Telephone Encounter (Signed)
Faxed hardcopy to pharmacy. 

## 2013-02-28 NOTE — Telephone Encounter (Signed)
Done hardcopy to robin  

## 2013-04-15 ENCOUNTER — Other Ambulatory Visit: Payer: Self-pay | Admitting: Internal Medicine

## 2013-05-01 ENCOUNTER — Other Ambulatory Visit: Payer: Self-pay | Admitting: Internal Medicine

## 2013-05-29 ENCOUNTER — Other Ambulatory Visit (INDEPENDENT_AMBULATORY_CARE_PROVIDER_SITE_OTHER): Payer: Medicare Other

## 2013-05-29 DIAGNOSIS — E119 Type 2 diabetes mellitus without complications: Secondary | ICD-10-CM

## 2013-05-29 LAB — BASIC METABOLIC PANEL WITH GFR
BUN: 11 mg/dL (ref 6–23)
CO2: 29 meq/L (ref 19–32)
Calcium: 9.5 mg/dL (ref 8.4–10.5)
Chloride: 101 meq/L (ref 96–112)
Creatinine, Ser: 0.7 mg/dL (ref 0.4–1.2)
GFR: 90.53 mL/min
Glucose, Bld: 117 mg/dL — ABNORMAL HIGH (ref 70–99)
Potassium: 3.6 meq/L (ref 3.5–5.1)
Sodium: 137 meq/L (ref 135–145)

## 2013-05-29 LAB — LIPID PANEL
Cholesterol: 153 mg/dL (ref 0–200)
HDL: 70.1 mg/dL
LDL Cholesterol: 43 mg/dL (ref 0–99)
Total CHOL/HDL Ratio: 2
Triglycerides: 198 mg/dL — ABNORMAL HIGH (ref 0.0–149.0)
VLDL: 39.6 mg/dL (ref 0.0–40.0)

## 2013-05-29 LAB — HEPATIC FUNCTION PANEL
ALT: 21 U/L (ref 0–35)
AST: 25 U/L (ref 0–37)
Bilirubin, Direct: 0.2 mg/dL (ref 0.0–0.3)
Total Bilirubin: 1.8 mg/dL — ABNORMAL HIGH (ref 0.3–1.2)
Total Protein: 7.8 g/dL (ref 6.0–8.3)

## 2013-06-04 ENCOUNTER — Other Ambulatory Visit: Payer: Self-pay | Admitting: Internal Medicine

## 2013-06-05 NOTE — Telephone Encounter (Signed)
Ok to refill? Last OV 6.23.14 Last filled 9.7.14

## 2013-06-06 NOTE — Telephone Encounter (Signed)
Done hardcopy to robin  

## 2013-06-06 NOTE — Telephone Encounter (Signed)
Faxed hardcopy to [facility] [address] [place] 

## 2013-06-13 ENCOUNTER — Other Ambulatory Visit (INDEPENDENT_AMBULATORY_CARE_PROVIDER_SITE_OTHER): Payer: Medicare Other

## 2013-06-13 ENCOUNTER — Ambulatory Visit (INDEPENDENT_AMBULATORY_CARE_PROVIDER_SITE_OTHER): Payer: Medicare Other | Admitting: Internal Medicine

## 2013-06-13 ENCOUNTER — Encounter: Payer: Self-pay | Admitting: Internal Medicine

## 2013-06-13 VITALS — BP 102/70 | HR 77 | Temp 98.2°F | Ht <= 58 in | Wt 157.4 lb

## 2013-06-13 DIAGNOSIS — R350 Frequency of micturition: Secondary | ICD-10-CM

## 2013-06-13 DIAGNOSIS — E119 Type 2 diabetes mellitus without complications: Secondary | ICD-10-CM

## 2013-06-13 DIAGNOSIS — Z23 Encounter for immunization: Secondary | ICD-10-CM

## 2013-06-13 DIAGNOSIS — I10 Essential (primary) hypertension: Secondary | ICD-10-CM

## 2013-06-13 DIAGNOSIS — R413 Other amnesia: Secondary | ICD-10-CM | POA: Insufficient documentation

## 2013-06-13 LAB — URINALYSIS, ROUTINE W REFLEX MICROSCOPIC
Bilirubin Urine: NEGATIVE
Hgb urine dipstick: NEGATIVE
Ketones, ur: NEGATIVE
Total Protein, Urine: NEGATIVE
Urine Glucose: NEGATIVE
pH: 7 (ref 5.0–8.0)

## 2013-06-13 MED ORDER — CEPHALEXIN 500 MG PO CAPS: 500.0000 mg | ORAL_CAPSULE | Freq: Four times a day (QID) | ORAL | Status: DC

## 2013-06-13 NOTE — Assessment & Plan Note (Signed)
C/w benign forgetfullness,  to f/u any worsening symptoms or concerns

## 2013-06-13 NOTE — Assessment & Plan Note (Signed)
?   Uti, for urine studies, empiric antibx

## 2013-06-13 NOTE — Progress Notes (Signed)
Pre-visit discussion using our clinic review tool. No additional management support is needed unless otherwise documented below in the visit note.  

## 2013-06-13 NOTE — Assessment & Plan Note (Signed)
stable overall by history and exam, recent data reviewed with pt, and pt to continue medical treatment as before,  to f/u any worsening symptoms or concerns BP Readings from Last 3 Encounters:  06/13/13 102/70  12/12/12 152/80  08/19/12 100/62

## 2013-06-13 NOTE — Assessment & Plan Note (Signed)
stable overall by history and exam, recent data reviewed with pt, and pt to continue medical treatment as before,  to f/u any worsening symptoms or concerns Lab Results  Component Value Date   HGBA1C 6.5 05/29/2013    

## 2013-06-13 NOTE — Patient Instructions (Signed)
You had the new Prevnar pneumonia shot today Please take all new medication as prescribed - the antibiotic Please go to the LAB in the Basement (turn left off the elevator) for the tests to be done today - just the urine test today Please continue all other medications as before, and refills have been done if requested. Please have the pharmacy call with any other refills you may need. Please continue your efforts at being more active, low cholesterol diabetic diet, and weight control.  Please remember to sign up for My Chart if you have not done so, as this will be important to you in the future with finding out test results, communicating by private email, and scheduling acute appointments online when needed.  Please return in 6 months, or sooner if needed, with Lab testing done 3-5 days before

## 2013-06-13 NOTE — Progress Notes (Signed)
Subjective:    Patient ID: Latasha Wang, female    DOB: 1941/04/23, 72 y.o.   MRN: [id number]  HPI  Here to f/u; c/o feverish and chills for 3 days assoc with ? Urinary freq and mild discomfort but Denies urinary symptoms such as urgency, flank pain, hematuria or n/v.  Also co some forgetfulness memory issue for several months she has become aware, such that she cannot remember what she went to a room for, but will remember about 10-15 min later.   Pt denies polydipsia, polyuria, or low sugar symptoms such as weakness or confusion improved with po intake.  Pt states overall good compliance with meds, trying to follow lower cholesterol, diabetic diet, wt overall stable but little exercise however.     Also due for prevnar. Pt denies chest pain, increased sob or doe, wheezing, orthopnea, PND, increased LE swelling, palpitations, dizziness or syncope. Past Medical History  Diagnosis Date  . ANXIETY   . DEPRESSION   . FATTY LIVER DISEASE   . Gastroparesis   . GERD   . HYPERLIPIDEMIA   . HYPERTENSION   . LATERAL EPICONDYLITIS, RIGHT   . Diabetes mellitus    Past Surgical History  Procedure Laterality Date  . Cataract extraction, bilateral    . 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 She does not have any smokeless tobacco history on file. She reports that she does not drink alcohol or use illicit drugs. family history is not on file. No Known Allergies Current Outpatient Prescriptions on File Prior to Visit  Medication Sig Dispense Refill  . acetaminophen (TYLENOL) 500 MG tablet Take 1-2 tablets (500-1,000 mg total) by mouth every 6 (six) hours as needed for pain or fever. For pain  90 tablet  0  . albuterol (PROVENTIL HFA;VENTOLIN HFA) 108 (90 BASE) MCG/ACT inhaler Inhale 2 puffs into the lungs every 6 (six) hours as needed  for wheezing.  1 Inhaler  0  . amLODipine (NORVASC) 10 MG tablet Take 10 mg by mouth daily.      . chlorpheniramine-HYDROcodone (TUSSIONEX PENNKINETIC ER) 10-8 MG/5ML LQCR Take 5 mLs by mouth every 12 (twelve) hours as needed (cough).  140 mL  0  . clonazePAM (KLONOPIN) 0.5 MG tablet TAKE 1 TABLET BY MOUTH TWICE A DAY AS NEEDED  60 tablet  2  . CRESTOR 10 MG tablet TAKE 1 TABLET (10 MG TOTAL) BY MOUTH DAILY.  90 tablet  3  . hydrochlorothiazide (MICROZIDE) 12.5 MG capsule Take 1 capsule (12.5 mg total) by mouth daily.  90 capsule  3  . irbesartan (AVAPRO) 300 MG tablet Take 1 tablet (300 mg total) by mouth at bedtime.  90 tablet  2  . meclizine (ANTIVERT) 12.5 MG tablet Take 12.5-25 mg by mouth every 6 (six) hours as needed. For dizziness      . meclizine (ANTIVERT) 12.5 MG tablet TAKE 1 TO 2 TABLETS BY MOUTH EVERY 6 HOURS AS NEEDED FOR DIZINESS  60 tablet  1  . metFORMIN (GLUCOPHAGE-XR) 500 MG 24 hr tablet Take 500 mg by mouth 2 (two) times daily.      . metFORMIN (GLUMETZA) 500 MG (MOD) 24 hr tablet Take 500 mg by mouth daily with breakfast.      . olanzapine-FLUoxetine (SYMBYAX) 6-50 MG per capsule Take 1 capsule by mouth daily.       Marland Kitchen  OLANZapine-FLUoxetine (SYMBYAX) 6-50 MG per capsule TAKE 1 CAPSULE BY MOUTH DAILY.  30 capsule  10  . pioglitazone (ACTOS) 45 MG tablet TAKE 1 TABLET (45 MG TOTAL) BY MOUTH DAILY.  90 tablet  3  . rosuvastatin (CRESTOR) 10 MG tablet Take 10 mg by mouth daily.       No current facility-administered medications on file prior to visit.   Review of Systems  Constitutional: Negative for unexpected weight change, or unusual diaphoresis  HENT: Negative for tinnitus.   Eyes: Negative for photophobia and visual disturbance.  Respiratory: Negative for choking and stridor.   Gastrointestinal: Negative for vomiting and blood in stool.  Genitourinary: Negative for hematuria and decreased urine volume.  Musculoskeletal: Negative for acute joint swelling Skin: Negative  for color change and wound.  Neurological: Negative for tremors and numbness other than noted  Psychiatric/Behavioral: Negative for decreased concentration or  hyperactivity.       Objective:   Physical Exam BP 102/70  Pulse 77  Temp(Src) 98.2 F (36.8 C) (Oral)  Ht 4\' 9"  (1.448 m)  Wt 157 lb 6 oz (71.385 kg)  BMI 34.05 kg/m2  SpO2 96% VS noted,  Constitutional: Pt appears well-developed and well-nourished.  HENT: Head: NCAT.  Right Ear: External ear normal.  Left Ear: External ear normal.  Eyes: Conjunctivae and EOM are normal. Pupils are equal, round, and reactive to light.  Neck: Normal range of motion. Neck supple.  Cardiovascular: Normal rate and regular rhythm.   Pulmonary/Chest: Effort normal and breath sounds normal.  Abd:  Soft, NT, non-distended, + BS Neurological: Pt is alert. Not confused  Skin: Skin is warm. No erythema.  Psychiatric: Pt behavior is normal. Thought content normal.     Assessment & Plan:

## 2013-06-14 ENCOUNTER — Other Ambulatory Visit: Payer: Self-pay | Admitting: Internal Medicine

## 2013-06-27 ENCOUNTER — Other Ambulatory Visit: Payer: Self-pay | Admitting: Internal Medicine

## 2013-07-01 ENCOUNTER — Other Ambulatory Visit: Payer: Self-pay | Admitting: Internal Medicine

## 2013-09-11 ENCOUNTER — Other Ambulatory Visit: Payer: Self-pay | Admitting: Internal Medicine

## 2013-09-12 NOTE — Telephone Encounter (Signed)
Done hardcopy to robin  

## 2013-09-12 NOTE — Telephone Encounter (Signed)
Faxed hardcopy to [facility] [address] [place] 

## 2013-09-13 ENCOUNTER — Telehealth: Payer: Self-pay

## 2013-09-13 NOTE — Telephone Encounter (Signed)
Disregard the last message, the pt's insurance does not cover her symbyax 6-50, could you please send an alternate to pharmacy

## 2013-09-13 NOTE — Telephone Encounter (Signed)
Pt's daughter called and stated that the pt insurance does not cover keflex 500 mg, and that she would need a new rx in place of this one to be [facility]

## 2013-09-15 MED ORDER — OLANZAPINE 5 MG PO TABS: 5.0000 mg | ORAL_TABLET | Freq: Every day | ORAL | Status: DC

## 2013-09-15 MED ORDER — FLUOXETINE HCL 40 MG PO CAPS: 40.0000 mg | ORAL_CAPSULE | Freq: Every day | ORAL | Status: DC

## 2013-09-15 NOTE — Addendum Note (Signed)
Addended by: Corwin LevinsJOHN, JAMES W on: 09/15/2013 06:00 PM   Modules accepted: Orders

## 2013-09-15 NOTE — Telephone Encounter (Signed)
Done erx - changed to zyprexa 5 mg qd AND generic prozac 40 qd since the symbyax is these 2 meds in one prescription

## 2013-09-22 LAB — HM DIABETES EYE EXAM

## 2013-09-24 ENCOUNTER — Other Ambulatory Visit: Payer: Self-pay | Admitting: Internal Medicine

## 2013-09-30 ENCOUNTER — Other Ambulatory Visit: Payer: Self-pay | Admitting: Internal Medicine

## 2013-12-04 ENCOUNTER — Telehealth: Payer: Self-pay | Admitting: Internal Medicine

## 2013-12-04 ENCOUNTER — Other Ambulatory Visit: Payer: Medicare Other

## 2013-12-04 NOTE — Telephone Encounter (Signed)
PT CAME BY TO HAVE LABS COMPLETED PRIOR TO HER NEXT SCHEDULED VISIT AND WAS TOLD THAT LAB ORDERS WERE NOT SUBMITTED. PLEASE CONTACT PT WHEN ORDERS ARE SUBMITTED.

## 2013-12-05 NOTE — Telephone Encounter (Signed)
Called the patient several times from phone number in chart no answer and no voice mail to leave message.

## 2013-12-05 NOTE — Telephone Encounter (Signed)
Unfort, I cannot tell her insurance from our EMR  If she has traditional medicare, pre visit lab work is not allowed.  If has other insurance, this is likely allowed

## 2013-12-06 NOTE — Telephone Encounter (Signed)
Patient informed of MD instructions regarding getting labs done before appointment.

## 2013-12-09 ENCOUNTER — Other Ambulatory Visit: Payer: Self-pay | Admitting: Internal Medicine

## 2013-12-12 ENCOUNTER — Encounter: Payer: Self-pay | Admitting: Internal Medicine

## 2013-12-12 ENCOUNTER — Other Ambulatory Visit (INDEPENDENT_AMBULATORY_CARE_PROVIDER_SITE_OTHER): Payer: Medicare Other

## 2013-12-12 ENCOUNTER — Ambulatory Visit (INDEPENDENT_AMBULATORY_CARE_PROVIDER_SITE_OTHER): Payer: Medicare Other | Admitting: Internal Medicine

## 2013-12-12 VITALS — BP 118/70 | HR 84 | Temp 99.7°F | Ht <= 58 in | Wt 159.8 lb

## 2013-12-12 DIAGNOSIS — Z23 Encounter for immunization: Secondary | ICD-10-CM

## 2013-12-12 DIAGNOSIS — E119 Type 2 diabetes mellitus without complications: Secondary | ICD-10-CM

## 2013-12-12 DIAGNOSIS — F329 Major depressive disorder, single episode, unspecified: Secondary | ICD-10-CM

## 2013-12-12 DIAGNOSIS — F3289 Other specified depressive episodes: Secondary | ICD-10-CM

## 2013-12-12 DIAGNOSIS — Z0000 Encounter for general adult medical examination without abnormal findings: Secondary | ICD-10-CM

## 2013-12-12 LAB — HEPATIC FUNCTION PANEL
ALBUMIN: 4.3 g/dL (ref 3.5–5.2)
ALT: 12 U/L (ref 0–35)
AST: 19 U/L (ref 0–37)
Alkaline Phosphatase: 47 U/L (ref 39–117)
Bilirubin, Direct: 0.2 mg/dL (ref 0.0–0.3)
TOTAL PROTEIN: 6.7 g/dL (ref 6.0–8.3)
Total Bilirubin: 1.4 mg/dL — ABNORMAL HIGH (ref 0.2–1.2)

## 2013-12-12 LAB — CBC WITH DIFFERENTIAL/PLATELET
BASOS PCT: 0.2 % (ref 0.0–3.0)
Basophils Absolute: 0 10*3/uL (ref 0.0–0.1)
EOS ABS: 0.1 10*3/uL (ref 0.0–0.7)
EOS PCT: 0.8 % (ref 0.0–5.0)
HCT: 34.5 % — ABNORMAL LOW (ref 36.0–46.0)
HEMOGLOBIN: 11.5 g/dL — AB (ref 12.0–15.0)
LYMPHS PCT: 29 % (ref 12.0–46.0)
Lymphs Abs: 1.9 10*3/uL (ref 0.7–4.0)
MCHC: 33.4 g/dL (ref 30.0–36.0)
MCV: 87.8 fl (ref 78.0–100.0)
MONOS PCT: 7.5 % (ref 3.0–12.0)
Monocytes Absolute: 0.5 10*3/uL (ref 0.1–1.0)
NEUTROS ABS: 4.2 10*3/uL (ref 1.4–7.7)
NEUTROS PCT: 62.5 % (ref 43.0–77.0)
Platelets: 208 10*3/uL (ref 150.0–400.0)
RBC: 3.93 Mil/uL (ref 3.87–5.11)
RDW: 15.5 % (ref 11.5–15.5)
WBC: 6.7 10*3/uL (ref 4.0–10.5)

## 2013-12-12 LAB — URINALYSIS, ROUTINE W REFLEX MICROSCOPIC
Bilirubin Urine: NEGATIVE
Hgb urine dipstick: NEGATIVE
Ketones, ur: NEGATIVE
Leukocytes, UA: NEGATIVE
Nitrite: NEGATIVE
RBC / HPF: NONE SEEN (ref 0–?)
SPECIFIC GRAVITY, URINE: 1.02 (ref 1.000–1.030)
TOTAL PROTEIN, URINE-UPE24: NEGATIVE
URINE GLUCOSE: NEGATIVE
UROBILINOGEN UA: 0.2 (ref 0.0–1.0)
pH: 5.5 (ref 5.0–8.0)

## 2013-12-12 LAB — BASIC METABOLIC PANEL
BUN: 15 mg/dL (ref 6–23)
CALCIUM: 9.6 mg/dL (ref 8.4–10.5)
CHLORIDE: 103 meq/L (ref 96–112)
CO2: 25 meq/L (ref 19–32)
CREATININE: 0.9 mg/dL (ref 0.4–1.2)
GFR: 65.41 mL/min (ref >60.00)
GLUCOSE: 115 mg/dL — AB (ref 70–99)
Potassium: 3.9 mEq/L (ref 3.5–5.1)
Sodium: 137 mEq/L (ref 135–145)

## 2013-12-12 LAB — HEMOGLOBIN A1C: HEMOGLOBIN A1C: 6.3 % (ref 4.6–6.5)

## 2013-12-12 LAB — LIPID PANEL
Cholesterol: 279 mg/dL — ABNORMAL HIGH (ref 0–200)
HDL: 55.4 mg/dL (ref >39.00)
LDL Cholesterol: 189 mg/dL — ABNORMAL HIGH (ref 0–99)
NonHDL: 223.6
TRIGLYCERIDES: 172 mg/dL — AB (ref 0.0–149.0)
Total CHOL/HDL Ratio: 5
VLDL: 34.4 mg/dL (ref 0.0–40.0)

## 2013-12-12 LAB — MICROALBUMIN / CREATININE URINE RATIO
Creatinine,U: 150.1 mg/dL
MICROALB/CREAT RATIO: 1 mg/g (ref 0.0–30.0)
Microalb, Ur: 1.5 mg/dL (ref 0.0–1.9)

## 2013-12-12 LAB — TSH: TSH: 1.68 u[IU]/mL (ref 0.35–4.50)

## 2013-12-12 MED ORDER — FLUOXETINE HCL 40 MG PO CAPS: 40.0000 mg | ORAL_CAPSULE | Freq: Every day | ORAL | Status: DC

## 2013-12-12 MED ORDER — FEXOFENADINE HCL 180 MG PO TABS: 180.0000 mg | ORAL_TABLET | Freq: Every day | ORAL | Status: DC

## 2013-12-12 MED ORDER — BUPROPION HCL ER (XL) 150 MG PO TB24: 150.0000 mg | ORAL_TABLET | Freq: Every day | ORAL | Status: DC

## 2013-12-12 NOTE — Patient Instructions (Addendum)
You had the new Prevnar pnemonia shot today  Please take all new medication as prescribed - the generic wellbutrin for depression, and allegra for allergies  Please continue all other medications as before, and refills have been done if requested.  Please have the pharmacy call with any other refills you may need.  Please continue your efforts at being more active, low cholesterol diet, and weight control.  You are otherwise up to date with prevention measures today.  Please keep your appointments with your specialists as you may have planned  Please go to the LAB in the Basement (turn left off the elevator) for the tests to be done today  You will be contacted by phone if any changes need to be made immediately.  Otherwise, you will receive a letter about your results with an explanation, but please check with MyChart first.  Please remember to sign up for MyChart if you have not done so, as this will be important to you in the future with finding out test results, communicating by private email, and scheduling acute appointments online when needed.  Please return in 6 months, or sooner if needed, with Lab testing done 3-5 days before, if you are able

## 2013-12-12 NOTE — Assessment & Plan Note (Signed)
stable overall by history and exam, recent data reviewed with pt, and pt to continue medical treatment as before,  to f/u any worsening symptoms or concerns Lab Results  Component Value Date   HGBA1C 6.5 05/29/2013

## 2013-12-12 NOTE — Progress Notes (Signed)
Pre visit review using our clinic review tool, if applicable. No additional management support is needed unless otherwise documented below in the visit note. 

## 2013-12-12 NOTE — Assessment & Plan Note (Addendum)
No longer taking the symbyax but is taking the prozac 40 qd, to add wellbutrin 150 qd, declines psych referral or counseling

## 2013-12-12 NOTE — Assessment & Plan Note (Signed)

## 2013-12-12 NOTE — Progress Notes (Signed)
Subjective:    Patient ID: Latasha Wang, female    DOB: 03/20/1941, 73 y.o.   MRN: [id number]  HPI Here for wellness and f/u;  Overall doing ok;  Pt denies CP, worsening SOB, DOE, wheezing, orthopnea, PND, worsening LE edema, palpitations, dizziness or syncope.  Pt denies neurological change such as new headache, facial or extremity weakness.  Pt denies polydipsia, polyuria, or low sugar symptoms. Pt states overall good compliance with treatment and medications, good tolerability, and has been trying to follow lower cholesterol diet.  Pt denies worsening depressive symptoms, suicidal ideation or panic. No fever, night sweats, wt loss, loss of appetite, or other constitutional symptoms.  Pt states good ability with ADL's, has low fall risk, home safety reviewed and adequate, no other significant changes in hearing or vision, and only occasionally active with exercise. Has had mild worsening depressive symptoms, but no suicidal ideation, or panic; has ongoing anxiety, not increased recently.  Also states feels warm each morning, but no other sinus, cough or GU symtpoms. Does have several wks ongoing nasal allergy symptoms with clearish congestion, itch and sneezing, without fever, pain, ST, cough, swelling or wheezing. Past Medical History  Diagnosis Date  . ANXIETY   . DEPRESSION   . FATTY LIVER DISEASE   . Gastroparesis   . GERD   . HYPERLIPIDEMIA   . HYPERTENSION   . LATERAL EPICONDYLITIS, RIGHT   . Diabetes mellitus    Past Surgical History  Procedure Laterality Date  . Cataract extraction, bilateral    . Total elbow arthroplasty  02/19/2012    Procedure: TOTAL ELBOW ARTHROPLASTY;  Surgeon: Budd PalmerMichael H Handy, MD;  Location: [facility] OR;  Service: Orthopedics;  Laterality: Left;    reports that she has never smoked. She does not have any smokeless tobacco history on file. She reports that she does not drink alcohol or use illicit drugs. family history is not on file. No Known Allergies Current  Outpatient Prescriptions on File Prior to Visit  Medication Sig Dispense Refill  . acetaminophen (TYLENOL) 500 MG tablet Take 1-2 tablets (500-1,000 mg total) by mouth every 6 (six) hours as needed for pain or fever. For pain  90 tablet  0  . albuterol (PROVENTIL HFA;VENTOLIN HFA) 108 (90 BASE) MCG/ACT inhaler Inhale 2 puffs into the lungs every 6 (six) hours as needed for wheezing.  1 Inhaler  0  . amLODipine (NORVASC) 10 MG tablet Take 10 mg by mouth daily.      Marland Kitchen. amLODipine (NORVASC) 10 MG tablet TAKE 1 TABLET BY MOUTH EVERY DAY  90 tablet  3  . chlorpheniramine-HYDROcodone (TUSSIONEX PENNKINETIC ER) 10-8 MG/5ML LQCR Take 5 mLs by mouth every 12 (twelve) hours as needed (cough).  140 mL  0  . clonazePAM (KLONOPIN) 0.5 MG tablet TAKE 1 TABLET BY MOUTH TWICE A DAY AS NEEDED  60 tablet  2  . CRESTOR 10 MG tablet TAKE 1 TABLET (10 MG TOTAL) BY MOUTH DAILY.  90 tablet  3  . hydrochlorothiazide (MICROZIDE) 12.5 MG capsule TAKE 1 CAPSULE (12.5 MG TOTAL) BY MOUTH DAILY.  90 capsule  3  . irbesartan (AVAPRO) 300 MG tablet TAKE 1 TABLET (300 MG TOTAL) BY MOUTH AT BEDTIME.  90 tablet  3  . meclizine (ANTIVERT) 12.5 MG tablet Take 12.5-25 mg by mouth every 6 (six) hours as needed. For dizziness      . meclizine (ANTIVERT) 12.5 MG tablet TAKE 1 TO 2 TABLETS BY MOUTH EVERY 6 HOURS AS NEEDED FOR  DIZINESS  60 tablet  1  . metFORMIN (GLUCOPHAGE-XR) 500 MG 24 hr tablet Take 500 mg by mouth 2 (two) times daily.      . metFORMIN (GLUCOPHAGE-XR) 500 MG 24 hr tablet TAKE 1 TABLET BY MOUTH TWICE A DAY  180 tablet  3  . metFORMIN (GLUCOPHAGE-XR) 500 MG 24 hr tablet TAKE 1 TABLET BY MOUTH TWICE A DAY  180 tablet  3  . metFORMIN (GLUMETZA) 500 MG (MOD) 24 hr tablet Take 500 mg by mouth daily with breakfast.      . OLANZapine (ZYPREXA) 5 MG tablet Take 1 tablet (5 mg total) by mouth daily.  30 tablet  5  . pioglitazone (ACTOS) 45 MG tablet TAKE 1 TABLET (45 MG TOTAL) BY MOUTH DAILY.  90 tablet  3  . rosuvastatin  (CRESTOR) 10 MG tablet Take 10 mg by mouth daily.       No current facility-administered medications on file prior to visit.     Review of Systems Constitutional: Negative for increased diaphoresis, other activity, appetite or other siginficant weight change  HENT: Negative for worsening hearing loss, ear pain, facial swelling, mouth sores and neck stiffness.   Eyes: Negative for other worsening pain, redness or visual disturbance.  Respiratory: Negative for shortness of breath and wheezing.   Cardiovascular: Negative for chest pain and palpitations.  Gastrointestinal: Negative for diarrhea, blood in stool, abdominal distention or other pain Genitourinary: Negative for hematuria, flank pain or change in urine volume.  Musculoskeletal: Negative for myalgias or other joint complaints.  Skin: Negative for color change and wound.  Neurological: Negative for syncope and numbness. other than noted Hematological: Negative for adenopathy. or other swelling Psychiatric/Behavioral: Negative for hallucinations, self-injury, decreased concentration or other worsening agitation.      Objective:   Physical Exam BP 118/70  Pulse 84  Temp(Src) 99.7 F (37.6 C) (Oral)  Ht 4\' 9"  (1.448 m)  Wt 159 lb 12 oz (72.462 kg)  BMI 34.56 kg/m2  SpO2 96% VS noted,  Constitutional: Pt is oriented to person, place, and time. Appears well-developed and well-nourished.  Head: Normocephalic and atraumatic.  Right Ear: External ear normal.  Left Ear: External ear normal.  Nose: Nose normal.  Mouth/Throat: Oropharynx is clear and moist.  Eyes: Conjunctivae and EOM are normal. Pupils are equal, round, and reactive to light.  Neck: Normal range of motion. Neck supple. No JVD present. No tracheal deviation present.  Cardiovascular: Normal rate, regular rhythm, normal heart sounds and intact distal pulses.   Pulmonary/Chest: Effort normal and breath sounds without rales or wheezing  Abdominal: Soft. Bowel sounds  are normal. NT. No HSM  Musculoskeletal: Normal range of motion. Exhibits no edema.  Lymphadenopathy:  Has no cervical adenopathy.  Neurological: Pt is alert and oriented to person, place, and time. Pt has normal reflexes. No cranial nerve deficit. Motor grossly intact Skin: Skin is warm and dry. No rash noted.  Psychiatric:  Has depressed mood and affect. Behavior is normal.     Assessment & Plan:

## 2013-12-12 NOTE — Addendum Note (Signed)
Addended by: Scharlene GlossEWING, ROBIN B on: 12/12/2013 03:58 PM   Modules accepted: Orders

## 2013-12-13 ENCOUNTER — Other Ambulatory Visit: Payer: Self-pay | Admitting: Internal Medicine

## 2013-12-13 ENCOUNTER — Encounter: Payer: Self-pay | Admitting: Internal Medicine

## 2013-12-13 MED ORDER — ROSUVASTATIN CALCIUM 20 MG PO TABS: 20.0000 mg | ORAL_TABLET | Freq: Every day | ORAL | Status: DC

## 2013-12-16 ENCOUNTER — Other Ambulatory Visit: Payer: Self-pay | Admitting: Internal Medicine

## 2013-12-19 NOTE — Telephone Encounter (Signed)
[facility] [place]

## 2013-12-19 NOTE — Telephone Encounter (Signed)
Done hardcopy to robin  

## 2014-03-13 ENCOUNTER — Other Ambulatory Visit: Payer: Self-pay

## 2014-03-13 MED ORDER — OLANZAPINE 5 MG PO TABS: 5.0000 mg | ORAL_TABLET | Freq: Every day | ORAL | Status: DC

## 2014-03-30 ENCOUNTER — Other Ambulatory Visit: Payer: Self-pay

## 2014-03-30 MED ORDER — PIOGLITAZONE HCL 45 MG PO TABS: 45.0000 mg | ORAL_TABLET | Freq: Every day | ORAL | Status: DC

## 2014-05-13 ENCOUNTER — Other Ambulatory Visit: Payer: Self-pay | Admitting: Internal Medicine

## 2014-05-15 NOTE — Telephone Encounter (Signed)
Done hardcopy to robin  

## 2014-05-16 NOTE — Telephone Encounter (Signed)
Faxed hardcopy for [facility]razolam to [facility] [address] [place]

## 2014-06-06 ENCOUNTER — Other Ambulatory Visit (INDEPENDENT_AMBULATORY_CARE_PROVIDER_SITE_OTHER): Payer: Medicare Other

## 2014-06-06 ENCOUNTER — Encounter: Payer: Self-pay | Admitting: Internal Medicine

## 2014-06-06 DIAGNOSIS — E119 Type 2 diabetes mellitus without complications: Secondary | ICD-10-CM

## 2014-06-06 LAB — HEPATIC FUNCTION PANEL
ALK PHOS: 46 U/L (ref 39–117)
ALT: 13 U/L (ref 0–35)
AST: 17 U/L (ref 0–37)
Albumin: 4.1 g/dL (ref 3.5–5.2)
BILIRUBIN DIRECT: 0.1 mg/dL (ref 0.0–0.3)
TOTAL PROTEIN: 6.8 g/dL (ref 6.0–8.3)
Total Bilirubin: 1.2 mg/dL (ref 0.2–1.2)

## 2014-06-06 LAB — LIPID PANEL
CHOL/HDL RATIO: 3
Cholesterol: 145 mg/dL (ref 0–200)
HDL: 56 mg/dL (ref >39.00)
LDL Cholesterol: 67 mg/dL (ref 0–99)
NONHDL: 89
Triglycerides: 111 mg/dL (ref 0.0–149.0)
VLDL: 22.2 mg/dL (ref 0.0–40.0)

## 2014-06-06 LAB — BASIC METABOLIC PANEL
BUN: 18 mg/dL (ref 6–23)
CALCIUM: 8.9 mg/dL (ref 8.4–10.5)
CHLORIDE: 110 meq/L (ref 96–112)
CO2: 27 mEq/L (ref 19–32)
Creatinine, Ser: 0.9 mg/dL (ref 0.4–1.2)
GFR: 69.77 mL/min (ref >60.00)
Glucose, Bld: 98 mg/dL (ref 70–99)
Potassium: 3.8 mEq/L (ref 3.5–5.1)
SODIUM: 142 meq/L (ref 135–145)

## 2014-06-06 LAB — HEMOGLOBIN A1C: Hgb A1c MFr Bld: 6.7 % — ABNORMAL HIGH (ref 4.6–6.5)

## 2014-06-07 ENCOUNTER — Telehealth: Payer: Self-pay | Admitting: Internal Medicine

## 2014-06-07 NOTE — Telephone Encounter (Signed)
emmi mailed  °

## 2014-06-12 ENCOUNTER — Ambulatory Visit (INDEPENDENT_AMBULATORY_CARE_PROVIDER_SITE_OTHER): Payer: Medicare Other | Admitting: Internal Medicine

## 2014-06-12 ENCOUNTER — Encounter: Payer: Self-pay | Admitting: Internal Medicine

## 2014-06-12 VITALS — BP 138/82 | HR 76 | Temp 98.3°F | Ht <= 58 in | Wt 161.0 lb

## 2014-06-12 DIAGNOSIS — F329 Major depressive disorder, single episode, unspecified: Secondary | ICD-10-CM

## 2014-06-12 DIAGNOSIS — F32A Depression, unspecified: Secondary | ICD-10-CM

## 2014-06-12 DIAGNOSIS — Z0000 Encounter for general adult medical examination without abnormal findings: Secondary | ICD-10-CM

## 2014-06-12 DIAGNOSIS — I10 Essential (primary) hypertension: Secondary | ICD-10-CM

## 2014-06-12 DIAGNOSIS — E119 Type 2 diabetes mellitus without complications: Secondary | ICD-10-CM

## 2014-06-12 DIAGNOSIS — Z0189 Encounter for other specified special examinations: Secondary | ICD-10-CM

## 2014-06-12 DIAGNOSIS — E785 Hyperlipidemia, unspecified: Secondary | ICD-10-CM

## 2014-06-12 MED ORDER — BUPROPION HCL ER (XL) 300 MG PO TB24: 300.0000 mg | ORAL_TABLET | Freq: Every day | ORAL | Status: DC

## 2014-06-12 NOTE — Progress Notes (Signed)
Pre visit review using our clinic review tool, if applicable. No additional management support is needed unless otherwise documented below in the visit note. 

## 2014-06-12 NOTE — Patient Instructions (Addendum)
Ok to increase the wellbutrin to 300 mg per day  Please continue all other medications as before, and refills have been done if requested.  Please have the pharmacy call with any other refills you may need.  Please continue your efforts at being more active, low cholesterol diet, and weight control.  Please keep your appointments with your specialists as you may have planned  Please return in 6 months, or sooner if needed, with Lab testing done 3-5 days before

## 2014-06-12 NOTE — Progress Notes (Signed)
Subjective:    Patient ID: Latasha Wang, female    DOB: 07/29/1940, 73 y.o.   MRN: [id number]  HPI  Here to f/u; overall doing ok,  Pt denies chest pain, increased sob or doe, wheezing, orthopnea, PND, increased LE swelling, palpitations, dizziness or syncope.  Pt denies polydipsia, polyuria, or low sugar symptoms such as weakness or confusion improved with po intake.  Pt denies new neurological symptoms such as new headache, or facial or extremity weakness or numbness.   Pt states overall good compliance with meds, has been trying to follow lower cholesterol, diabetic diet, with wt overall stable,  but little exercise however.  Has also had mlld worsening depressive symptoms in last few wks, no suicidal ideation, or panic; has ongoing anxiety, not increased recently.  Overall good compliance with treatment, and good medicine tolerability. Past Medical History  Diagnosis Date  . ANXIETY   . DEPRESSION   . FATTY LIVER DISEASE   . Gastroparesis   . GERD   . HYPERLIPIDEMIA   . HYPERTENSION   . LATERAL EPICONDYLITIS, RIGHT   . Diabetes mellitus    Past Surgical History  Procedure Laterality Date  . Cataract extraction, bilateral    . Total elbow arthroplasty  02/19/2012    Procedure: TOTAL ELBOW ARTHROPLASTY;  Surgeon: Budd PalmerMichael H Handy, MD;  Location: [facility] OR;  Service: Orthopedics;  Laterality: Left;    reports that she has never smoked. She does not have any smokeless tobacco history on file. She reports that she does not drink alcohol or use illicit drugs. family history is not on file. No Known Allergies Current Outpatient Prescriptions on File Prior to Visit  Medication Sig Dispense Refill  . acetaminophen (TYLENOL) 500 MG tablet Take 1-2 tablets (500-1,000 mg total) by mouth every 6 (six) hours as needed for pain or fever. For pain 90 tablet 0  . albuterol (PROVENTIL HFA;VENTOLIN HFA) 108 (90 BASE) MCG/ACT inhaler Inhale 2 puffs into the lungs every 6 (six) hours as needed for wheezing.  1 Inhaler 0  . amLODipine (NORVASC) 10 MG tablet Take 10 mg by mouth daily.    . clonazePAM (KLONOPIN) 0.5 MG tablet TAKE 1 TABLET BY MOUTH TWICE DAILY AS NEEDED 60 tablet 3  . fexofenadine (ALLEGRA) 180 MG tablet Take 1 tablet (180 mg total) by mouth daily. 90 tablet 3  . FLUoxetine (PROZAC) 40 MG capsule Take 1 capsule (40 mg total) by mouth daily. 90 capsule 3  . hydrochlorothiazide (MICROZIDE) 12.5 MG capsule TAKE 1 CAPSULE (12.5 MG TOTAL) BY MOUTH DAILY. 90 capsule 3  . irbesartan (AVAPRO) 300 MG tablet TAKE 1 TABLET (300 MG TOTAL) BY MOUTH AT BEDTIME. 90 tablet 3  . meclizine (ANTIVERT) 12.5 MG tablet Take 12.5-25 mg by mouth every 6 (six) hours as needed. For dizziness    . meclizine (ANTIVERT) 12.5 MG tablet TAKE 1 TO 2 TABLETS BY MOUTH EVERY 6 HOURS AS NEEDED FOR DIZINESS 60 tablet 1  . metFORMIN (GLUCOPHAGE-XR) 500 MG 24 hr tablet Take 500 mg by mouth 2 (two) times daily.    . metFORMIN (GLUCOPHAGE-XR) 500 MG 24 hr tablet TAKE 1 TABLET BY MOUTH TWICE A DAY 180 tablet 3  . metFORMIN (GLUMETZA) 500 MG (MOD) 24 hr tablet Take 500 mg by mouth daily with breakfast.    . OLANZapine (ZYPREXA) 5 MG tablet Take 1 tablet (5 mg total) by mouth daily. 30 tablet 5  . pioglitazone (ACTOS) 45 MG tablet Take 1 tablet (45 mg total) by  mouth daily. 90 tablet 3  . rosuvastatin (CRESTOR) 20 MG tablet Take 1 tablet (20 mg total) by mouth daily. 90 tablet 3   No current facility-administered medications on file prior to visit.    Review of Systems  Constitutional: Negative for unusual diaphoresis or other sweats  HENT: Negative for ringing in ear Eyes: Negative for double vision or worsening visual disturbance.  Respiratory: Negative for choking and stridor.   Gastrointestinal: Negative for vomiting or other signifcant bowel change Genitourinary: Negative for hematuria or decreased urine volume.  Musculoskeletal: Negative for other MSK pain or swelling Skin: Negative for color change and worsening  wound.  Neurological: Negative for tremors and numbness other than noted  Psychiatric/Behavioral: Negative for decreased concentration or agitation other than above       Objective:   Physical Exam BP 138/82 mmHg  Pulse 76  Temp(Src) 98.3 F (36.8 C) (Oral)  Ht 4\' 9"  (1.448 m)  Wt 161 lb (73.029 kg)  BMI 34.83 kg/m2  SpO2 96% VS noted,  Constitutional: Pt appears well-developed, well-nourished.  HENT: Head: NCAT.  Right Ear: External ear normal.  Left Ear: External ear normal.  Eyes: . Pupils are equal, round, and reactive to light. Conjunctivae and EOM are normal Neck: Normal range of motion. Neck supple.  Cardiovascular: Normal rate and regular rhythm.   Pulmonary/Chest: Effort normal and breath sounds without rales or wheezing.  Abd:  Soft, NT, ND, + BS Neurological: Pt is alert. Not confused , motor grossly intact Skin: Skin is warm. No rash Psychiatric: Pt behavior is normal. No agitation. + depressed affect    Assessment & Plan:

## 2014-06-21 NOTE — Assessment & Plan Note (Signed)
stable overall by history and exam, recent data reviewed with pt, and pt to continue medical treatment as before,  to f/u any worsening symptoms or concerns Lab Results  Component Value Date   LDLCALC 67 06/06/2014

## 2014-06-21 NOTE — Assessment & Plan Note (Signed)
Mild worsening, verified no SI, for increaed wellbutrin xl 300 qd,  to f/u any worsening symptoms or concerns

## 2014-06-21 NOTE — Assessment & Plan Note (Signed)
stable overall by history and exam, recent data reviewed with pt, and pt to continue medical treatment as before,  to f/u any worsening symptoms or concerns Lab Results  Component Value Date   HGBA1C 6.7* 06/06/2014

## 2014-06-21 NOTE — Assessment & Plan Note (Signed)
stable overall by history and exam, recent data reviewed with pt, and pt to continue medical treatment as before,  to f/u any worsening symptoms or concerns BP Readings from Last 3 Encounters:  06/12/14 138/82  12/12/13 118/70  06/13/13 102/70

## 2014-06-24 ENCOUNTER — Other Ambulatory Visit: Payer: Self-pay | Admitting: Internal Medicine

## 2014-08-06 ENCOUNTER — Other Ambulatory Visit: Payer: Self-pay | Admitting: Internal Medicine

## 2014-08-13 ENCOUNTER — Other Ambulatory Visit: Payer: Self-pay | Admitting: Internal Medicine

## 2014-08-28 ENCOUNTER — Other Ambulatory Visit: Payer: Self-pay | Admitting: Internal Medicine

## 2014-08-30 ENCOUNTER — Other Ambulatory Visit: Payer: Self-pay | Admitting: Internal Medicine

## 2014-09-22 ENCOUNTER — Other Ambulatory Visit: Payer: Self-pay | Admitting: Internal Medicine

## 2014-09-25 ENCOUNTER — Other Ambulatory Visit: Payer: Self-pay | Admitting: *Deleted

## 2014-09-25 MED ORDER — OLANZAPINE 5 MG PO TABS: ORAL_TABLET | ORAL | Status: DC

## 2014-09-28 ENCOUNTER — Encounter: Payer: Self-pay | Admitting: Internal Medicine

## 2014-11-09 ENCOUNTER — Other Ambulatory Visit: Payer: Self-pay | Admitting: Internal Medicine

## 2014-11-13 MED ORDER — CLONAZEPAM 0.5 MG PO TABS: 0.5000 mg | ORAL_TABLET | Freq: Two times a day (BID) | ORAL | Status: DC | PRN

## 2014-11-13 NOTE — Telephone Encounter (Signed)
Rx faxed to pharmacy  

## 2014-11-13 NOTE — Telephone Encounter (Signed)
Done hardcopy to [facility]  

## 2014-11-26 ENCOUNTER — Other Ambulatory Visit: Payer: Self-pay | Admitting: Internal Medicine

## 2014-12-05 ENCOUNTER — Other Ambulatory Visit (INDEPENDENT_AMBULATORY_CARE_PROVIDER_SITE_OTHER): Payer: Medicare Other

## 2014-12-05 DIAGNOSIS — Z0189 Encounter for other specified special examinations: Secondary | ICD-10-CM

## 2014-12-05 DIAGNOSIS — Z0000 Encounter for general adult medical examination without abnormal findings: Secondary | ICD-10-CM

## 2014-12-05 DIAGNOSIS — E119 Type 2 diabetes mellitus without complications: Secondary | ICD-10-CM | POA: Diagnosis not present

## 2014-12-05 LAB — CBC WITH DIFFERENTIAL/PLATELET
Basophils Absolute: 0 10*3/uL (ref 0.0–0.1)
Basophils Relative: 0.8 % (ref 0.0–3.0)
EOS PCT: 1.4 % (ref 0.0–5.0)
Eosinophils Absolute: 0.1 10*3/uL (ref 0.0–0.7)
HEMATOCRIT: 36.2 % (ref 36.0–46.0)
HEMOGLOBIN: 11.9 g/dL — AB (ref 12.0–15.0)
LYMPHS ABS: 1.8 10*3/uL (ref 0.7–4.0)
LYMPHS PCT: 34 % (ref 12.0–46.0)
MCHC: 32.8 g/dL (ref 30.0–36.0)
MCV: 88.8 fl (ref 78.0–100.0)
MONOS PCT: 7.1 % (ref 3.0–12.0)
Monocytes Absolute: 0.4 10*3/uL (ref 0.1–1.0)
NEUTROS ABS: 3.1 10*3/uL (ref 1.4–7.7)
Neutrophils Relative %: 56.7 % (ref 43.0–77.0)
Platelets: 228 10*3/uL (ref 150.0–400.0)
RBC: 4.08 Mil/uL (ref 3.87–5.11)
RDW: 15.7 % — ABNORMAL HIGH (ref 11.5–15.5)
WBC: 5.4 10*3/uL (ref 4.0–10.5)

## 2014-12-05 LAB — MICROALBUMIN / CREATININE URINE RATIO
Creatinine,U: 159.8 mg/dL
Microalb Creat Ratio: 0.9 mg/g (ref 0.0–30.0)
Microalb, Ur: 1.4 mg/dL (ref 0.0–1.9)

## 2014-12-05 LAB — HEPATIC FUNCTION PANEL
ALK PHOS: 57 U/L (ref 39–117)
ALT: 16 U/L (ref 0–35)
AST: 18 U/L (ref 0–37)
Albumin: 4.4 g/dL (ref 3.5–5.2)
BILIRUBIN DIRECT: 0.3 mg/dL (ref 0.0–0.3)
Total Bilirubin: 1.3 mg/dL — ABNORMAL HIGH (ref 0.2–1.2)
Total Protein: 7.1 g/dL (ref 6.0–8.3)

## 2014-12-05 LAB — URINALYSIS, ROUTINE W REFLEX MICROSCOPIC
Bilirubin Urine: NEGATIVE
Hgb urine dipstick: NEGATIVE
KETONES UR: NEGATIVE
Leukocytes, UA: NEGATIVE
NITRITE: NEGATIVE
PH: 6 (ref 5.0–8.0)
RBC / HPF: NONE SEEN (ref 0–?)
Specific Gravity, Urine: >=1.03 — AB (ref 1.000–1.030)
Total Protein, Urine: NEGATIVE
Urine Glucose: NEGATIVE
Urobilinogen, UA: 0.2 (ref 0.0–1.0)
WBC, UA: NONE SEEN (ref 0–?)

## 2014-12-05 LAB — BASIC METABOLIC PANEL
BUN: 23 mg/dL (ref 6–23)
CO2: 29 meq/L (ref 19–32)
CREATININE: 1.08 mg/dL (ref 0.40–1.20)
Calcium: 9.7 mg/dL (ref 8.4–10.5)
Chloride: 105 mEq/L (ref 96–112)
GFR: 52.85 mL/min — ABNORMAL LOW (ref >60.00)
Glucose, Bld: 131 mg/dL — ABNORMAL HIGH (ref 70–99)
Potassium: 4.3 mEq/L (ref 3.5–5.1)
Sodium: 141 mEq/L (ref 135–145)

## 2014-12-05 LAB — LIPID PANEL
Cholesterol: 135 mg/dL (ref 0–200)
HDL: 63.5 mg/dL (ref >39.00)
LDL Cholesterol: 56 mg/dL (ref 0–99)
NonHDL: 71.5
Total CHOL/HDL Ratio: 2
Triglycerides: 80 mg/dL (ref 0.0–149.0)
VLDL: 16 mg/dL (ref 0.0–40.0)

## 2014-12-05 LAB — TSH: TSH: 1.56 u[IU]/mL (ref 0.35–4.50)

## 2014-12-05 LAB — HEMOGLOBIN A1C: Hgb A1c MFr Bld: 6 % (ref 4.6–6.5)

## 2014-12-08 ENCOUNTER — Other Ambulatory Visit: Payer: Self-pay | Admitting: Internal Medicine

## 2014-12-12 ENCOUNTER — Ambulatory Visit (INDEPENDENT_AMBULATORY_CARE_PROVIDER_SITE_OTHER): Payer: Medicare Other | Admitting: Internal Medicine

## 2014-12-12 ENCOUNTER — Encounter: Payer: Self-pay | Admitting: Internal Medicine

## 2014-12-12 VITALS — BP 124/76 | HR 68 | Temp 98.3°F | Ht <= 58 in | Wt 150.0 lb

## 2014-12-12 DIAGNOSIS — Z0000 Encounter for general adult medical examination without abnormal findings: Secondary | ICD-10-CM

## 2014-12-12 DIAGNOSIS — E119 Type 2 diabetes mellitus without complications: Secondary | ICD-10-CM

## 2014-12-12 NOTE — Progress Notes (Signed)
Pre visit review using our clinic review tool, if applicable. No additional management support is needed unless otherwise documented below in the visit note. 

## 2014-12-12 NOTE — Assessment & Plan Note (Signed)
stable overall by history and exam, recent data reviewed with pt, and pt to continue medical treatment as before,  to f/u any worsening symptoms or concerns Lab Results  Component Value Date   HGBA1C 6.0 12/05/2014

## 2014-12-12 NOTE — Patient Instructions (Signed)
Please continue all other medications as before, and refills have been done if requested.  Please have the pharmacy call with any other refills you may need.  Please continue your efforts at being more active, low cholesterol diet, and weight control.  You are otherwise up to date with prevention measures today.  Please keep your appointments with your specialists as you may have planned  Please return in 6 months, or sooner if needed, with Lab testing done 3-5 days before  

## 2014-12-12 NOTE — Assessment & Plan Note (Signed)

## 2014-12-12 NOTE — Progress Notes (Signed)
Subjective:    Patient ID: Latasha Wang, female    DOB: 05/12/1941, 74 y.o.   MRN: [id number]  HPI  Here for wellness and f/u;  Overall doing ok;  Pt denies Chest pain, worsening SOB, DOE, wheezing, orthopnea, PND, worsening LE edema, palpitations, dizziness or syncope.  Pt denies neurological change such as new headache, facial or extremity weakness.  Pt denies polydipsia, polyuria, or low sugar symptoms. Pt states overall good compliance with treatment and medications, good tolerability, and has been trying to follow appropriate diet.  Pt denies worsening depressive symptoms, suicidal ideation or panic. No fever, night sweats, wt loss, loss of appetite, or other constitutional symptoms.  Pt states good ability with ADL's, has low fall risk, home safety reviewed and adequate, no other significant changes in hearing or vision, and only occasionally active with exercise. Has lost some wt - gets daily exercise in the AM.  [facility]    Wt Readings from Last 3 Encounters:  12/12/14 150 lb (68.04 kg)  06/12/14 161 lb (73.029 kg)  12/12/13 159 lb 12 oz (72.462 kg)   Past Medical History  Diagnosis Date  . ANXIETY   . DEPRESSION   . FATTY LIVER DISEASE   . Gastroparesis   . GERD   . HYPERLIPIDEMIA   . HYPERTENSION   . LATERAL EPICONDYLITIS, RIGHT   . Diabetes mellitus    Past Surgical History  Procedure Laterality Date  . Cataract extraction, bilateral    . 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 She does not have any smokeless tobacco history on file. She reports that she does not drink alcohol or use illicit drugs. family history is not on file. No Known Allergies Current Outpatient Prescriptions on File Prior to Visit  Medication Sig Dispense Refill  . acetaminophen (TYLENOL) 500 MG tablet Take 1-2 tablets  (500-1,000 mg total) by mouth every 6 (six) hours as needed for pain or fever. For pain 90 tablet 0  . albuterol (PROVENTIL HFA;VENTOLIN HFA) 108 (90 BASE) MCG/ACT inhaler Inhale 2 puffs into the lungs every 6 (six) hours as needed for wheezing. 1 Inhaler 0  . amLODipine (NORVASC) 10 MG tablet Take 10 mg by mouth daily.    Marland Kitchen amLODipine (NORVASC) 10 MG tablet TAKE 1 TABLET BY MOUTH EVERY DAY 90 tablet 3  . buPROPion (WELLBUTRIN XL) 300 MG 24 hr tablet Take 1 tablet (300 mg total) by mouth daily. 90 tablet 3  . clonazePAM (KLONOPIN) 0.5 MG tablet Take 1 tablet (0.5 mg total) by mouth 2 (two) times daily as needed. 60 tablet 3  . CRESTOR 10 MG tablet TAKE 1 TABLET (10 MG TOTAL) BY MOUTH DAILY. 90 tablet 3  . fexofenadine (ALLEGRA) 180 MG tablet Take 1 tablet (180 mg total) by mouth daily. 90 tablet 3  . FLUoxetine (PROZAC) 40 MG capsule Take 1 capsule (40 mg total) by mouth daily. 90 capsule 3  . hydrochlorothiazide (MICROZIDE) 12.5 MG capsule TAKE 1 CAPSULE (12.5 MG TOTAL) BY MOUTH DAILY. 90 capsule 0  . irbesartan (AVAPRO) 300 MG tablet TAKE 1 TABLET (300 MG TOTAL) BY MOUTH AT BEDTIME. 90 tablet 1  . meclizine (ANTIVERT) 12.5 MG tablet Take 12.5-25 mg by mouth every 6 (six) hours as needed. For dizziness    . meclizine (ANTIVERT) 12.5 MG tablet  TAKE 1 TO 2 TABLETS EVERY 6 HOURS AS NEEDED FOR DIZZINESS 60 tablet 0  . meclizine (ANTIVERT) 12.5 MG tablet TAKE 1 TO 2 TABLETS EVERY 6 HOURS AS NEEDED FOR DIZZINESS 60 tablet 0  . metFORMIN (GLUCOPHAGE-XR) 500 MG 24 hr tablet Take 500 mg by mouth 2 (two) times daily.    . metFORMIN (GLUCOPHAGE-XR) 500 MG 24 hr tablet TAKE 1 TABLET BY MOUTH TWICE A DAY 180 tablet 1  . metFORMIN (GLUMETZA) 500 MG (MOD) 24 hr tablet Take 500 mg by mouth daily with breakfast.    . OLANZapine (ZYPREXA) 5 MG tablet TAKE 1 TABLET (5 MG TOTAL) BY MOUTH DAILY. 90 tablet 2  . pioglitazone (ACTOS) 45 MG tablet Take 1 tablet (45 mg total) by mouth daily. 90 tablet 3  . rosuvastatin  (CRESTOR) 20 MG tablet Take 1 tablet (20 mg total) by mouth daily. 90 tablet 3   No current facility-administered medications on file prior to visit.   Review of Systems Constitutional: Negative for increased diaphoresis, other activity, appetite or siginficant weight change other than noted HENT: Negative for worsening hearing loss, ear pain, facial swelling, mouth sores and neck stiffness.   Eyes: Negative for other worsening pain, redness or visual disturbance.  Respiratory: Negative for shortness of breath and wheezing  Cardiovascular: Negative for chest pain and palpitations.  Gastrointestinal: Negative for diarrhea, blood in stool, abdominal distention or other pain Genitourinary: Negative for hematuria, flank pain or change in urine volume.  Musculoskeletal: Negative for myalgias or other joint complaints.  Skin: Negative for color change and wound or drainage.  Neurological: Negative for syncope and numbness. other than noted Hematological: Negative for adenopathy. or other swelling Psychiatric/Behavioral: Negative for hallucinations, SI, self-injury, decreased concentration or other worsening agitation.      Objective:   Physical Exam BP 124/76 mmHg  Pulse 68  Temp(Src) 98.3 F (36.8 C) (Oral)  Ht 4\' 9"  (1.448 m)  Wt 150 lb (68.04 kg)  BMI 32.45 kg/m2  SpO2 96% VS noted,  Constitutional: Pt is oriented to person, place, and time. Appears well-developed and well-nourished, in no significant distress Head: Normocephalic and atraumatic.  Right Ear: External ear normal.  Left Ear: External ear normal.  Nose: Nose normal.  Mouth/Throat: Oropharynx is clear and moist.  Eyes: Conjunctivae and EOM are normal. Pupils are equal, round, and reactive to light.  Neck: Normal range of motion. Neck supple. No JVD present. No tracheal deviation present or significant neck LA or mass Cardiovascular: Normal rate, regular rhythm, normal heart sounds and intact distal pulses.     Pulmonary/Chest: Effort normal and breath sounds without rales or wheezing  Abdominal: Soft. Bowel sounds are normal. NT. No HSM  Musculoskeletal: Normal range of motion. Exhibits no edema.  Lymphadenopathy:  Has no cervical adenopathy.  Neurological: Pt is alert and oriented to person, place, and time. Pt has normal reflexes. No cranial nerve deficit. Motor grossly intact Skin: Skin is warm and dry. No rash noted.  Psychiatric:  Has normal mood and affect. Behavior is normal.      Assessment & Plan:

## 2014-12-24 ENCOUNTER — Other Ambulatory Visit: Payer: Self-pay | Admitting: Internal Medicine

## 2015-01-06 ENCOUNTER — Other Ambulatory Visit: Payer: Self-pay | Admitting: Internal Medicine

## 2015-01-07 ENCOUNTER — Other Ambulatory Visit: Payer: Self-pay

## 2015-01-07 MED ORDER — MECLIZINE HCL 12.5 MG PO TABS: ORAL_TABLET | ORAL | Status: DC

## 2015-01-07 MED ORDER — BUPROPION HCL ER (XL) 150 MG PO TB24: 150.0000 mg | ORAL_TABLET | Freq: Every day | ORAL | Status: DC

## 2015-03-08 ENCOUNTER — Other Ambulatory Visit: Payer: Self-pay | Admitting: Internal Medicine

## 2015-03-09 ENCOUNTER — Other Ambulatory Visit: Payer: Self-pay | Admitting: Internal Medicine

## 2015-04-08 ENCOUNTER — Other Ambulatory Visit: Payer: Self-pay | Admitting: Internal Medicine

## 2015-05-18 ENCOUNTER — Other Ambulatory Visit: Payer: Self-pay | Admitting: Internal Medicine

## 2015-05-24 ENCOUNTER — Other Ambulatory Visit: Payer: Self-pay | Admitting: Internal Medicine

## 2015-05-28 NOTE — Telephone Encounter (Signed)
Klonopin already refilled nov 28

## 2015-06-01 ENCOUNTER — Other Ambulatory Visit: Payer: Self-pay | Admitting: Internal Medicine

## 2015-06-03 NOTE — Telephone Encounter (Signed)
[facility] HireKristen verified if she received refill on 05/20/15 for pt clonazepam. Pharmacist stated they did not received. Gave md approval from 05/20/15...Raechel Chute/lmb

## 2015-06-03 NOTE — Telephone Encounter (Signed)
Looks like this was just prescribed two weeks ago and got 60 pills - is that correct?

## 2015-06-03 NOTE — Telephone Encounter (Signed)
MD out of office pls advise on refill...Raechel Chute/LMB

## 2015-06-06 ENCOUNTER — Other Ambulatory Visit (INDEPENDENT_AMBULATORY_CARE_PROVIDER_SITE_OTHER): Payer: Medicare Other

## 2015-06-06 DIAGNOSIS — E119 Type 2 diabetes mellitus without complications: Secondary | ICD-10-CM

## 2015-06-06 LAB — BASIC METABOLIC PANEL
BUN: 16 mg/dL (ref 6–23)
CO2: 30 mEq/L (ref 19–32)
CREATININE: 0.86 mg/dL (ref 0.40–1.20)
Calcium: 9.3 mg/dL (ref 8.4–10.5)
Chloride: 105 mEq/L (ref 96–112)
GFR: 68.65 mL/min (ref >60.00)
GLUCOSE: 99 mg/dL (ref 70–99)
Potassium: 3.7 mEq/L (ref 3.5–5.1)
Sodium: 141 mEq/L (ref 135–145)

## 2015-06-06 LAB — LIPID PANEL
CHOL/HDL RATIO: 2
Cholesterol: 144 mg/dL (ref 0–200)
HDL: 74.3 mg/dL (ref >39.00)
LDL CALC: 50 mg/dL (ref 0–99)
NonHDL: 69.4
Triglycerides: 97 mg/dL (ref 0.0–149.0)
VLDL: 19.4 mg/dL (ref 0.0–40.0)

## 2015-06-06 LAB — HEMOGLOBIN A1C: Hgb A1c MFr Bld: 6.3 % (ref 4.6–6.5)

## 2015-06-06 LAB — HEPATIC FUNCTION PANEL
ALBUMIN: 4.3 g/dL (ref 3.5–5.2)
ALT: 14 U/L (ref 0–35)
AST: 17 U/L (ref 0–37)
Alkaline Phosphatase: 60 U/L (ref 39–117)
BILIRUBIN TOTAL: 0.9 mg/dL (ref 0.2–1.2)
Bilirubin, Direct: 0.2 mg/dL (ref 0.0–0.3)
Total Protein: 7.4 g/dL (ref 6.0–8.3)

## 2015-06-08 ENCOUNTER — Other Ambulatory Visit: Payer: Self-pay | Admitting: Internal Medicine

## 2015-06-13 ENCOUNTER — Ambulatory Visit (INDEPENDENT_AMBULATORY_CARE_PROVIDER_SITE_OTHER): Payer: Medicare Other | Admitting: Internal Medicine

## 2015-06-13 ENCOUNTER — Encounter: Payer: Self-pay | Admitting: Internal Medicine

## 2015-06-13 ENCOUNTER — Other Ambulatory Visit (INDEPENDENT_AMBULATORY_CARE_PROVIDER_SITE_OTHER): Payer: Medicare Other

## 2015-06-13 VITALS — BP 116/78 | HR 72 | Temp 98.3°F | Ht <= 58 in | Wt 153.0 lb

## 2015-06-13 DIAGNOSIS — R413 Other amnesia: Secondary | ICD-10-CM

## 2015-06-13 DIAGNOSIS — Z0000 Encounter for general adult medical examination without abnormal findings: Secondary | ICD-10-CM

## 2015-06-13 DIAGNOSIS — Z0189 Encounter for other specified special examinations: Secondary | ICD-10-CM

## 2015-06-13 DIAGNOSIS — I10 Essential (primary) hypertension: Secondary | ICD-10-CM | POA: Diagnosis not present

## 2015-06-13 DIAGNOSIS — E785 Hyperlipidemia, unspecified: Secondary | ICD-10-CM

## 2015-06-13 DIAGNOSIS — E119 Type 2 diabetes mellitus without complications: Secondary | ICD-10-CM

## 2015-06-13 NOTE — Progress Notes (Signed)
Subjective:    Patient ID: Latasha Wang, female    DOB: September 28, 1940, 74 y.o.   MRN: [id number]  HPI    Here to f/u; overall doing ok,  Pt denies chest pain, increasing sob or doe, wheezing, orthopnea, PND, increased LE swelling, palpitations, dizziness or syncope.  Pt denies new neurological symptoms such as new headache, or facial or extremity weakness or numbness.  Pt denies polydipsia, polyuria, or low sugar episode.   Pt denies new neurological symptoms such as new headache, or facial or extremity weakness or numbness.   Pt states overall good compliance with meds, mostly trying to follow appropriate diet, with wt overall stable,  but little exercise however.  Also with swollen lower lip assoc with poor lower anterior dentition, has low funds and has not bee seeing dentist.  Also has 6 mo worsening memory dysfunction with forgetting converstations. Denies worsening depressive symptoms, suicidal ideation, or panic Past Medical History  Diagnosis Date  . ANXIETY   . DEPRESSION   . FATTY LIVER DISEASE   . Gastroparesis   . GERD   . HYPERLIPIDEMIA   . HYPERTENSION   . LATERAL EPICONDYLITIS, RIGHT   . Diabetes mellitus    Past Surgical History  Procedure Laterality Date  . Cataract extraction, bilateral    . 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 She does not have any smokeless tobacco history on file. She reports that she does not drink alcohol or use illicit drugs. family history is not on file. No Known Allergies Current Outpatient Prescriptions on File Prior to Visit  Medication Sig Dispense Refill  . acetaminophen (TYLENOL) 500 MG tablet Take 1-2 tablets (500-1,000 mg total) by mouth every 6 (six) hours as needed for pain or fever. For pain 90 tablet 0  . albuterol (PROVENTIL HFA;VENTOLIN HFA) 108 (90 BASE) MCG/ACT  inhaler Inhale 2 puffs into the lungs every 6 (six) hours as needed for wheezing. 1 Inhaler 0  . amLODipine (NORVASC) 10 MG tablet Take 10 mg by mouth daily.    Marland Kitchen buPROPion (WELLBUTRIN XL) 150 MG 24 hr tablet Take 1 tablet (150 mg total) by mouth daily. 90 tablet 3  . clonazePAM (KLONOPIN) 0.5 MG tablet TAKE 1 TABLET BY MOUTH TWICE A DAY AS NEEDED 60 tablet 0  . CRESTOR 10 MG tablet TAKE 1 TABLET (10 MG TOTAL) BY MOUTH DAILY. 90 tablet 3  . fexofenadine (ALLEGRA) 180 MG tablet Take 1 tablet (180 mg total) by mouth daily. 90 tablet 3  . FLUoxetine (PROZAC) 40 MG capsule TAKE ONE CAPSULE BY MOUTH DAILY 90 capsule 3  . hydrochlorothiazide (MICROZIDE) 12.5 MG capsule Take 1 capsule (12.5 mg total) by mouth daily. 90 capsule 2  . irbesartan (AVAPRO) 300 MG tablet TAKE 1 TABLET (300 MG TOTAL) BY MOUTH AT BEDTIME. 90 tablet 1  . meclizine (ANTIVERT) 12.5 MG tablet Take 12.5-25 mg by mouth every 6 (six) hours as needed. For dizziness    . metFORMIN (GLUCOPHAGE-XR) 500 MG 24 hr tablet Take 500 mg by mouth 2 (two) times daily.    . metFORMIN (GLUMETZA) 500 MG (MOD) 24 hr tablet Take 500 mg by mouth daily with breakfast.    . OLANZapine (ZYPREXA) 5 MG tablet TAKE 1 TABLET (5 MG TOTAL) BY MOUTH DAILY. 90 tablet 2  . pioglitazone (ACTOS) 45  MG tablet TAKE 1 TABLET (45 MG TOTAL) BY MOUTH DAILY. 90 tablet 2  . rosuvastatin (CRESTOR) 20 MG tablet Take 1 tablet (20 mg total) by mouth daily. 90 tablet 3   No current facility-administered medications on file prior to visit.   Review of Systems  Constitutional: Negative for unusual diaphoresis or night sweats HENT: Negative for ringing in ear or discharge Eyes: Negative for double vision or worsening visual disturbance.  Respiratory: Negative for choking and stridor.   Gastrointestinal: Negative for vomiting or other signifcant bowel change Genitourinary: Negative for hematuria or change in urine volume.  Musculoskeletal: Negative for other MSK pain or  swelling Skin: Negative for color change and worsening wound.  Neurological: Negative for tremors and numbness other than noted  Psychiatric/Behavioral: Negative for decreased concentration or agitation other than above       Objective:   Physical Exam BP 116/78 mmHg  Pulse 72  Temp(Src) 98.3 F (36.8 C) (Oral)  Ht 4\' 9"  (1.448 m)  Wt 153 lb (69.4 kg)  BMI 33.10 kg/m2  SpO2 98% VS noted,  Constitutional: Pt appears in no significant distress HENT: Head: NCAT.  Right Ear: External ear normal.  Left Ear: External ear normal.  Eyes: . Pupils are equal, round, and reactive to light. Conjunctivae and EOM are normal Neck: Normal range of motion. Neck supple.  Cardiovascular: Normal rate and regular rhythm.   Pulmonary/Chest: Effort normal and breath sounds without rales or wheezing.  Abd:  Soft, NT, ND, + BS Neurological: Pt is alert. Not confused , motor grossly intact, + mild ST memory dysfunction Skin: Skin is warm. No rash, no LE edema Psychiatric: Pt behavior is normal. No agitation. mild dysphoric affect    Assessment & Plan:

## 2015-06-13 NOTE — Assessment & Plan Note (Addendum)
?   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

## 2015-06-13 NOTE — Patient Instructions (Signed)
Please continue all other medications as before, and refills have been done if requested.  Please have the pharmacy call with any other refills you may need.  Please continue your efforts at being more active, low cholesterol diet, and weight control.  You are otherwise up to date with prevention measures today.  Please keep your appointments with your specialists as you may have planned  Please go to the LAB in the Basement (turn left off the elevator) for the tests to be done today - just the B12 level today  You will be contacted regarding the referral for: Head MRI and the Neurology referral  Please return in 6 months, or sooner if needed, with Lab testing done 3-5 days before

## 2015-06-13 NOTE — Progress Notes (Signed)
Pre visit review using our clinic review tool, if applicable. No additional management support is needed unless otherwise documented below in the visit note. 

## 2015-06-14 LAB — VITAMIN B12: Vitamin B-12: 424 pg/mL (ref 211–911)

## 2015-06-15 NOTE — Assessment & Plan Note (Signed)
stable overall by history and exam, recent data reviewed with pt, and pt to continue medical treatment as before,  to f/u any worsening symptoms or concerns Lab Results  Component Value Date   HGBA1C 6.3 06/06/2015

## 2015-06-15 NOTE — Assessment & Plan Note (Signed)
stable overall by history and exam, recent data reviewed with pt, and pt to continue medical treatment as before,  to f/u any worsening symptoms or concerns Lab Results  Component Value Date   LDLCALC 50 06/06/2015

## 2015-06-15 NOTE — Assessment & Plan Note (Signed)
stable overall by history and exam, recent data reviewed with pt, and pt to continue medical treatment as before,  to f/u any worsening symptoms or concerns BP Readings from Last 3 Encounters:  06/13/15 116/78  12/12/14 124/76  06/12/14 138/82

## 2015-07-02 ENCOUNTER — Other Ambulatory Visit: Payer: Medicare Other

## 2015-07-08 ENCOUNTER — Other Ambulatory Visit: Payer: Self-pay | Admitting: Internal Medicine

## 2015-07-08 NOTE — Telephone Encounter (Signed)
Done hardcopy to [facility]  

## 2015-07-08 NOTE — Telephone Encounter (Signed)
Please advise, thanks.

## 2015-07-09 ENCOUNTER — Telehealth: Payer: Self-pay

## 2015-07-09 NOTE — Telephone Encounter (Signed)
Medication klonipin printed signed and faxed

## 2015-07-11 ENCOUNTER — Encounter: Payer: Self-pay | Admitting: Internal Medicine

## 2015-08-27 ENCOUNTER — Ambulatory Visit: Payer: Medicare Other | Admitting: Family Medicine

## 2015-08-28 ENCOUNTER — Ambulatory Visit: Payer: Medicare Other | Admitting: Internal Medicine

## 2015-09-29 ENCOUNTER — Other Ambulatory Visit: Payer: Self-pay | Admitting: Internal Medicine

## 2015-10-10 ENCOUNTER — Other Ambulatory Visit: Payer: Self-pay | Admitting: Internal Medicine

## 2015-12-07 ENCOUNTER — Other Ambulatory Visit: Payer: Self-pay | Admitting: Internal Medicine

## 2015-12-10 ENCOUNTER — Other Ambulatory Visit: Payer: Self-pay | Admitting: Internal Medicine

## 2015-12-11 ENCOUNTER — Other Ambulatory Visit (INDEPENDENT_AMBULATORY_CARE_PROVIDER_SITE_OTHER): Payer: Medicare Other

## 2015-12-11 DIAGNOSIS — E119 Type 2 diabetes mellitus without complications: Secondary | ICD-10-CM

## 2015-12-11 DIAGNOSIS — Z0000 Encounter for general adult medical examination without abnormal findings: Secondary | ICD-10-CM

## 2015-12-11 DIAGNOSIS — Z0189 Encounter for other specified special examinations: Secondary | ICD-10-CM

## 2015-12-11 LAB — HEPATIC FUNCTION PANEL
ALBUMIN: 4.3 g/dL (ref 3.5–5.2)
ALT: 12 U/L (ref 0–35)
AST: 16 U/L (ref 0–37)
Alkaline Phosphatase: 61 U/L (ref 39–117)
Bilirubin, Direct: 0.2 mg/dL (ref 0.0–0.3)
Total Bilirubin: 1.4 mg/dL — ABNORMAL HIGH (ref 0.2–1.2)
Total Protein: 7.3 g/dL (ref 6.0–8.3)

## 2015-12-11 LAB — CBC WITH DIFFERENTIAL/PLATELET
Basophils Absolute: 0 10*3/uL (ref 0.0–0.1)
Basophils Relative: 1 % (ref 0.0–3.0)
EOS PCT: 2.1 % (ref 0.0–5.0)
Eosinophils Absolute: 0.1 10*3/uL (ref 0.0–0.7)
HCT: 35.2 % — ABNORMAL LOW (ref 36.0–46.0)
HEMOGLOBIN: 11.7 g/dL — AB (ref 12.0–15.0)
LYMPHS PCT: 38.7 % (ref 12.0–46.0)
Lymphs Abs: 1.8 10*3/uL (ref 0.7–4.0)
MCHC: 33.1 g/dL (ref 30.0–36.0)
MCV: 87.2 fl (ref 78.0–100.0)
MONO ABS: 0.3 10*3/uL (ref 0.1–1.0)
MONOS PCT: 6.8 % (ref 3.0–12.0)
Neutro Abs: 2.5 10*3/uL (ref 1.4–7.7)
Neutrophils Relative %: 51.4 % (ref 43.0–77.0)
Platelets: 210 10*3/uL (ref 150.0–400.0)
RBC: 4.04 Mil/uL (ref 3.87–5.11)
RDW: 15.2 % (ref 11.5–15.5)
WBC: 4.8 10*3/uL (ref 4.0–10.5)

## 2015-12-11 LAB — LIPID PANEL
CHOLESTEROL: 164 mg/dL (ref 0–200)
HDL: 56.5 mg/dL (ref >39.00)
LDL CALC: 80 mg/dL (ref 0–99)
NonHDL: 107.68
TRIGLYCERIDES: 138 mg/dL (ref 0.0–149.0)
Total CHOL/HDL Ratio: 3
VLDL: 27.6 mg/dL (ref 0.0–40.0)

## 2015-12-11 LAB — URINALYSIS, ROUTINE W REFLEX MICROSCOPIC
Bilirubin Urine: NEGATIVE
HGB URINE DIPSTICK: NEGATIVE
Ketones, ur: NEGATIVE
LEUKOCYTES UA: NEGATIVE
NITRITE: NEGATIVE
RBC / HPF: NONE SEEN (ref 0–?)
Specific Gravity, Urine: 1.01 (ref 1.000–1.030)
Total Protein, Urine: NEGATIVE
URINE GLUCOSE: NEGATIVE
Urobilinogen, UA: 0.2 (ref 0.0–1.0)
WBC, UA: NONE SEEN (ref 0–?)
pH: 7 (ref 5.0–8.0)

## 2015-12-11 LAB — MICROALBUMIN / CREATININE URINE RATIO
Creatinine,U: 63.2 mg/dL
Microalb Creat Ratio: 1.1 mg/g (ref 0.0–30.0)

## 2015-12-11 LAB — BASIC METABOLIC PANEL
BUN: 22 mg/dL (ref 6–23)
CALCIUM: 9.5 mg/dL (ref 8.4–10.5)
CO2: 30 mEq/L (ref 19–32)
Chloride: 104 mEq/L (ref 96–112)
Creatinine, Ser: 0.88 mg/dL (ref 0.40–1.20)
GFR: 66.76 mL/min (ref >60.00)
GLUCOSE: 111 mg/dL — AB (ref 70–99)
Potassium: 3.9 mEq/L (ref 3.5–5.1)
Sodium: 141 mEq/L (ref 135–145)

## 2015-12-11 LAB — TSH: TSH: 1.35 u[IU]/mL (ref 0.35–4.50)

## 2015-12-11 LAB — HEMOGLOBIN A1C: Hgb A1c MFr Bld: 6.4 % (ref 4.6–6.5)

## 2015-12-12 ENCOUNTER — Encounter: Payer: Self-pay | Admitting: Internal Medicine

## 2015-12-12 ENCOUNTER — Ambulatory Visit (INDEPENDENT_AMBULATORY_CARE_PROVIDER_SITE_OTHER): Payer: Medicare Other | Admitting: Internal Medicine

## 2015-12-12 VITALS — BP 118/70 | HR 71 | Temp 98.0°F | Resp 20 | Wt 160.0 lb

## 2015-12-12 DIAGNOSIS — E119 Type 2 diabetes mellitus without complications: Secondary | ICD-10-CM | POA: Diagnosis not present

## 2015-12-12 DIAGNOSIS — E785 Hyperlipidemia, unspecified: Secondary | ICD-10-CM | POA: Diagnosis not present

## 2015-12-12 DIAGNOSIS — F329 Major depressive disorder, single episode, unspecified: Secondary | ICD-10-CM | POA: Diagnosis not present

## 2015-12-12 DIAGNOSIS — R6889 Other general symptoms and signs: Secondary | ICD-10-CM

## 2015-12-12 DIAGNOSIS — Z0001 Encounter for general adult medical examination with abnormal findings: Secondary | ICD-10-CM | POA: Diagnosis not present

## 2015-12-12 DIAGNOSIS — F32A Depression, unspecified: Secondary | ICD-10-CM

## 2015-12-12 DIAGNOSIS — I10 Essential (primary) hypertension: Secondary | ICD-10-CM | POA: Diagnosis not present

## 2015-12-12 NOTE — Patient Instructions (Signed)
OK to stop the olanzapine (zyprexa)  Please continue all other medications as before, and refills have been done if requested.  Please have the pharmacy call with any other refills you may need.  Please continue your efforts at being more active, low cholesterol diet, and weight control.  You are otherwise up to date with prevention measures today.  Please keep your appointments with your specialists as you may have planned  Please return in 6 months, or sooner if needed, with Lab testing done 3-5 days before

## 2015-12-12 NOTE — Progress Notes (Signed)
Pre visit review using our clinic review tool, if applicable. No additional management support is needed unless otherwise documented below in the visit note. 

## 2015-12-12 NOTE — Progress Notes (Signed)
Subjective:    Patient ID: Latasha Wang, female    DOB: 06/19/1941, 75 y.o.   MRN: [id number]  HPI  Here for wellness and f/u;  Overall doing ok;  Pt denies Chest pain, worsening SOB, DOE, wheezing, orthopnea, PND, worsening LE edema, palpitations, dizziness or syncope.  Pt denies neurological change such as new headache, facial or extremity weakness. Pt states overall good compliance with treatment and medications, good tolerability, and has been trying to follow appropriate diet.  No fever, night sweats, wt loss, loss of appetite, or other constitutional symptoms.  Pt states good ability with ADL's, has low fall risk, home safety reviewed and adequate, no other significant changes in hearing or vision, and only occasionally active with exercise.  Has gained some wt with less activity. Wt Readings from Last 3 Encounters:  12/12/15 160 lb (72.576 kg)  06/13/15 153 lb (69.4 kg)  12/12/14 150 lb (68.04 kg)  Denies worsening depressive symptoms, suicidal ideation, or panic; has ongoing anxiety, not increased recently.  Does c/o ongoing fatigue, but denies signficant daytime hypersomnolence. Denies urinary symptoms such as dysuria, frequency, urgency, flank pain, hematuria or n/v, fever, chills.  Denies worsening reflux, abd pain, dysphagia, n/v, bowel change or blood.  Pt denies polydipsia, polyuria, or low sugar symptoms such as weakness or confusion improved with po intake.  Pt states overall good compliance with meds, trying to follow lower cholesterol, diabetic diet, wt overall stable but little exercise however.     Past Medical History  Diagnosis Date  . ANXIETY   . DEPRESSION   . FATTY LIVER DISEASE   . Gastroparesis   . GERD   . HYPERLIPIDEMIA   . HYPERTENSION   . LATERAL EPICONDYLITIS, RIGHT   . Diabetes mellitus    Past Surgical History  Procedure Laterality Date  . Cataract extraction, bilateral    . Total elbow arthroplasty  02/19/2012    Procedure: TOTAL ELBOW ARTHROPLASTY;   Surgeon: Budd PalmerMichael H Handy, MD;  Location: [facility] OR;  Service: Orthopedics;  Laterality: Left;    reports that she has never smoked. She does not have any smokeless tobacco history on file. She reports that she does not drink alcohol or use illicit drugs. family history is not on file. No Known Allergies Current Outpatient Prescriptions on File Prior to Visit  Medication Sig Dispense Refill  . acetaminophen (TYLENOL) 500 MG tablet Take 1-2 tablets (500-1,000 mg total) by mouth every 6 (six) hours as needed for pain or fever. For pain 90 tablet 0  . albuterol (PROVENTIL HFA;VENTOLIN HFA) 108 (90 BASE) MCG/ACT inhaler Inhale 2 puffs into the lungs every 6 (six) hours as needed for wheezing. 1 Inhaler 0  . amLODipine (NORVASC) 10 MG tablet Take 10 mg by mouth daily.    Marland Kitchen. amLODipine (NORVASC) 10 MG tablet TAKE 1 TABLET BY MOUTH EVERY DAY 90 tablet 3  . buPROPion (WELLBUTRIN XL) 150 MG 24 hr tablet Take 1 tablet (150 mg total) by mouth daily. 90 tablet 3  . clonazePAM (KLONOPIN) 0.5 MG tablet TAKE 1 TABLET BY MOUTH TWICE A DAY AS NEEDED 60 tablet 2  . CRESTOR 10 MG tablet TAKE 1 TABLET (10 MG TOTAL) BY MOUTH DAILY. 90 tablet 1  . fexofenadine (ALLEGRA) 180 MG tablet Take 1 tablet (180 mg total) by mouth daily. 90 tablet 3  . FLUoxetine (PROZAC) 40 MG capsule TAKE ONE CAPSULE BY MOUTH DAILY 90 capsule 3  . hydrochlorothiazide (MICROZIDE) 12.5 MG capsule Take 1 capsule (12.5 mg  total) by mouth daily. 90 capsule 2  . irbesartan (AVAPRO) 300 MG tablet TAKE 1 TABLET (300 MG TOTAL) BY MOUTH AT BEDTIME. 90 tablet 1  . meclizine (ANTIVERT) 12.5 MG tablet Take 12.5-25 mg by mouth every 6 (six) hours as needed. For dizziness    . metFORMIN (GLUCOPHAGE-XR) 500 MG 24 hr tablet Take 500 mg by mouth 2 (two) times daily.    . metFORMIN (GLUCOPHAGE-XR) 500 MG 24 hr tablet TAKE 1 TABLET BY MOUTH TWICE A DAY 180 tablet 1  . metFORMIN (GLUMETZA) 500 MG (MOD) 24 hr tablet Take 500 mg by mouth daily with breakfast.    .  pioglitazone (ACTOS) 45 MG tablet TAKE 1 TABLET (45 MG TOTAL) BY MOUTH DAILY. 90 tablet 2  . rosuvastatin (CRESTOR) 20 MG tablet Take 1 tablet (20 mg total) by mouth daily. 90 tablet 3   No current facility-administered medications on file prior to visit.   Review of Systems Constitutional: Negative for increased diaphoresis, or other activity, appetite or siginficant weight change other than noted HENT: Negative for worsening hearing loss, ear pain, facial swelling, mouth sores and neck stiffness.   Eyes: Negative for other worsening pain, redness or visual disturbance.  Respiratory: Negative for choking or stridor Cardiovascular: Negative for other chest pain and palpitations.  Gastrointestinal: Negative for worsening diarrhea, blood in stool, or abdominal distention Genitourinary: Negative for hematuria, flank pain or change in urine volume.  Musculoskeletal: Negative for myalgias or other joint complaints.  Skin: Negative for other color change and wound or drainage.  Neurological: Negative for syncope and numbness. other than noted Hematological: Negative for adenopathy. or other swelling Psychiatric/Behavioral: Negative for hallucinations, SI, self-injury, decreased concentration or other worsening agitation.      Objective:   Physical Exam BP 118/70 mmHg  Pulse 71  Temp(Src) 98 F (36.7 C) (Oral)  Resp 20  Wt 160 lb (72.576 kg)  SpO2 97% VS noted,  Constitutional: Pt is oriented to person, place, and time. Appears well-developed and well-nourished, in no significant distress Head: Normocephalic and atraumatic  Eyes: Conjunctivae and EOM are normal. Pupils are equal, round, and reactive to light Right Ear: External ear normal.  Left Ear: External ear normal Nose: Nose normal.  Mouth/Throat: Oropharynx is clear and moist  Neck: Normal range of motion. Neck supple. No JVD present. No tracheal deviation present or significant neck LA or mass Cardiovascular: Normal rate,  regular rhythm, normal heart sounds and intact distal pulses.   Pulmonary/Chest: Effort normal and breath sounds without rales or wheezing  Abdominal: Soft. Bowel sounds are normal. NT. No HSM  Musculoskeletal: Normal range of motion. Exhibits no edema Lymphadenopathy: Has no cervical adenopathy.  Neurological: Pt is alert and oriented to person, place, and time. Pt has normal reflexes. No cranial nerve deficit. Motor grossly intact Skin: Skin is warm and dry. No rash noted or new ulcers Psychiatric:  Has nslight nervous mood and affect. Behavior is normal.   *RADIOLOGY REPORT*  Clinical Data: Cough and fever.  CHEST - 2 VIEW  Comparison: 02/19/2012.  Findings: Trachea is midline. Heart is enlarged, stable. Thoracic aorta is calcified. Lungs are clear. No pleural fluid.  IMPRESSION: No acute findings.   Original Report Authenticated By: Leanna BattlesMelinda Blietz, M.D.    Assessment & Plan:

## 2015-12-14 NOTE — Assessment & Plan Note (Addendum)
Stable, ok to d/c zyprexa especially given wt gain, o/w stable overall by history and exam, and pt to continue medical treatment as before,  to f/u any worsening symptoms or concerns  In addition to the time spent performing CPE, I spent an additional 15 minutes face to face,in which greater than 50% of this time was spent in counseling and coordination of care for patient's illness as documented.

## 2015-12-14 NOTE — Assessment & Plan Note (Signed)

## 2015-12-14 NOTE — Assessment & Plan Note (Signed)
stable overall by history and exam, recent data reviewed with pt, and pt to continue medical treatment as before,  to f/u any worsening symptoms or concerns Lab Results  Component Value Date   LDLCALC 80 12/11/2015

## 2015-12-14 NOTE — Assessment & Plan Note (Signed)
stable overall by history and exam, recent data reviewed with pt, and pt to continue medical treatment as before,  to f/u any worsening symptoms or concerns Lab Results  Component Value Date   HGBA1C 6.4 12/11/2015    

## 2015-12-14 NOTE — Assessment & Plan Note (Signed)
stable overall by history and exam, recent data reviewed with pt, and pt to continue medical treatment as before,  to f/u any worsening symptoms or concerns BP Readings from Last 3 Encounters:  12/12/15 118/70  06/13/15 116/78  12/12/14 124/76

## 2015-12-15 ENCOUNTER — Other Ambulatory Visit: Payer: Self-pay | Admitting: Internal Medicine

## 2015-12-26 ENCOUNTER — Other Ambulatory Visit: Payer: Self-pay | Admitting: *Deleted

## 2015-12-26 MED ORDER — MECLIZINE HCL 12.5 MG PO TABS: 12.5000 mg | ORAL_TABLET | Freq: Four times a day (QID) | ORAL | Status: DC | PRN

## 2016-01-14 ENCOUNTER — Other Ambulatory Visit: Payer: Self-pay | Admitting: Internal Medicine

## 2016-01-14 NOTE — Telephone Encounter (Signed)
Ok to contact pt regarding this med; we do not have this on her med list, it may have been prescribed by another MD  Please verify if pt taking, but then pt should seek refill from original provider if possible

## 2016-01-19 ENCOUNTER — Other Ambulatory Visit: Payer: Self-pay | Admitting: Internal Medicine

## 2016-01-25 ENCOUNTER — Other Ambulatory Visit: Payer: Self-pay | Admitting: Internal Medicine

## 2016-01-28 NOTE — Telephone Encounter (Signed)
Done hardcopy to Corinne  

## 2016-01-28 NOTE — Telephone Encounter (Signed)
Medication refill sent to pharmacy  

## 2016-02-20 ENCOUNTER — Other Ambulatory Visit: Payer: Self-pay | Admitting: Internal Medicine

## 2016-03-08 ENCOUNTER — Inpatient Hospital Stay (HOSPITAL_COMMUNITY)
Admission: EM | Admit: 2016-03-08 | Discharge: 2016-03-16 | DRG: 871 | Disposition: A | Discharge status: Skilled Nursing Facility | Payer: Medicare Other | Attending: Internal Medicine | Admitting: Internal Medicine

## 2016-03-08 ENCOUNTER — Observation Stay (HOSPITAL_COMMUNITY): Payer: Medicare Other

## 2016-03-08 ENCOUNTER — Encounter (HOSPITAL_COMMUNITY): Payer: Self-pay | Admitting: Emergency Medicine

## 2016-03-08 ENCOUNTER — Emergency Department (HOSPITAL_COMMUNITY): Payer: Medicare Other

## 2016-03-08 DIAGNOSIS — N179 Acute kidney failure, unspecified: Secondary | ICD-10-CM | POA: Diagnosis present

## 2016-03-08 DIAGNOSIS — E1143 Type 2 diabetes mellitus with diabetic autonomic (poly)neuropathy: Secondary | ICD-10-CM | POA: Diagnosis present

## 2016-03-08 DIAGNOSIS — G92 Toxic encephalopathy: Secondary | ICD-10-CM | POA: Diagnosis present

## 2016-03-08 DIAGNOSIS — E871 Hypo-osmolality and hyponatremia: Secondary | ICD-10-CM | POA: Diagnosis present

## 2016-03-08 DIAGNOSIS — Z6836 Body mass index (BMI) 36.0-36.9, adult: Secondary | ICD-10-CM

## 2016-03-08 DIAGNOSIS — Z9842 Cataract extraction status, left eye: Secondary | ICD-10-CM

## 2016-03-08 DIAGNOSIS — K760 Fatty (change of) liver, not elsewhere classified: Secondary | ICD-10-CM

## 2016-03-08 DIAGNOSIS — R059 Cough, unspecified: Secondary | ICD-10-CM

## 2016-03-08 DIAGNOSIS — K219 Gastro-esophageal reflux disease without esophagitis: Secondary | ICD-10-CM | POA: Diagnosis present

## 2016-03-08 DIAGNOSIS — A419 Sepsis, unspecified organism: Principal | ICD-10-CM | POA: Diagnosis present

## 2016-03-08 DIAGNOSIS — R509 Fever, unspecified: Secondary | ICD-10-CM | POA: Diagnosis present

## 2016-03-08 DIAGNOSIS — D62 Acute posthemorrhagic anemia: Secondary | ICD-10-CM

## 2016-03-08 DIAGNOSIS — R05 Cough: Secondary | ICD-10-CM

## 2016-03-08 DIAGNOSIS — F329 Major depressive disorder, single episode, unspecified: Secondary | ICD-10-CM | POA: Diagnosis present

## 2016-03-08 DIAGNOSIS — G9340 Encephalopathy, unspecified: Secondary | ICD-10-CM | POA: Diagnosis present

## 2016-03-08 DIAGNOSIS — E1165 Type 2 diabetes mellitus with hyperglycemia: Secondary | ICD-10-CM | POA: Diagnosis present

## 2016-03-08 DIAGNOSIS — E118 Type 2 diabetes mellitus with unspecified complications: Secondary | ICD-10-CM

## 2016-03-08 DIAGNOSIS — W19XXXA Unspecified fall, initial encounter: Secondary | ICD-10-CM

## 2016-03-08 DIAGNOSIS — M549 Dorsalgia, unspecified: Secondary | ICD-10-CM

## 2016-03-08 DIAGNOSIS — Z9841 Cataract extraction status, right eye: Secondary | ICD-10-CM

## 2016-03-08 DIAGNOSIS — E876 Hypokalemia: Secondary | ICD-10-CM

## 2016-03-08 DIAGNOSIS — R5381 Other malaise: Secondary | ICD-10-CM

## 2016-03-08 DIAGNOSIS — E1122 Type 2 diabetes mellitus with diabetic chronic kidney disease: Secondary | ICD-10-CM

## 2016-03-08 DIAGNOSIS — E119 Type 2 diabetes mellitus without complications: Secondary | ICD-10-CM

## 2016-03-08 DIAGNOSIS — E785 Hyperlipidemia, unspecified: Secondary | ICD-10-CM | POA: Diagnosis present

## 2016-03-08 DIAGNOSIS — F411 Generalized anxiety disorder: Secondary | ICD-10-CM | POA: Diagnosis present

## 2016-03-08 DIAGNOSIS — R32 Unspecified urinary incontinence: Secondary | ICD-10-CM | POA: Diagnosis present

## 2016-03-08 DIAGNOSIS — Z7984 Long term (current) use of oral hypoglycemic drugs: Secondary | ICD-10-CM

## 2016-03-08 DIAGNOSIS — R7989 Other specified abnormal findings of blood chemistry: Secondary | ICD-10-CM

## 2016-03-08 DIAGNOSIS — I10 Essential (primary) hypertension: Secondary | ICD-10-CM | POA: Diagnosis not present

## 2016-03-08 DIAGNOSIS — K3184 Gastroparesis: Secondary | ICD-10-CM | POA: Diagnosis present

## 2016-03-08 DIAGNOSIS — M6281 Muscle weakness (generalized): Secondary | ICD-10-CM

## 2016-03-08 DIAGNOSIS — E860 Dehydration: Secondary | ICD-10-CM | POA: Diagnosis present

## 2016-03-08 DIAGNOSIS — R410 Disorientation, unspecified: Secondary | ICD-10-CM

## 2016-03-08 DIAGNOSIS — Z79899 Other long term (current) drug therapy: Secondary | ICD-10-CM

## 2016-03-08 LAB — COMPREHENSIVE METABOLIC PANEL
ALT: 18 U/L (ref 14–54)
AST: 22 U/L (ref 15–41)
Albumin: 3.8 g/dL (ref 3.5–5.0)
Alkaline Phosphatase: 44 U/L (ref 38–126)
Anion gap: 8 (ref 5–15)
BILIRUBIN TOTAL: 2.1 mg/dL — AB (ref 0.3–1.2)
BUN: 16 mg/dL (ref 6–20)
CALCIUM: 8.7 mg/dL — AB (ref 8.9–10.3)
CO2: 23 mmol/L (ref 22–32)
CREATININE: 1.16 mg/dL — AB (ref 0.44–1.00)
Chloride: 100 mmol/L — ABNORMAL LOW (ref 101–111)
GFR calc Af Amer: 52 mL/min — ABNORMAL LOW (ref >60)
GFR, EST NON AFRICAN AMERICAN: 45 mL/min — AB (ref >60)
GLUCOSE: 115 mg/dL — AB (ref 65–99)
POTASSIUM: 3.7 mmol/L (ref 3.5–5.1)
SODIUM: 131 mmol/L — AB (ref 135–145)
TOTAL PROTEIN: 7.3 g/dL (ref 6.5–8.1)

## 2016-03-08 LAB — GLUCOSE, CAPILLARY: GLUCOSE-CAPILLARY: 156 mg/dL — AB (ref 65–99)

## 2016-03-08 LAB — URINALYSIS, ROUTINE W REFLEX MICROSCOPIC
Bilirubin Urine: NEGATIVE
Glucose, UA: NEGATIVE mg/dL
Hgb urine dipstick: NEGATIVE
Ketones, ur: NEGATIVE mg/dL
LEUKOCYTES UA: NEGATIVE
NITRITE: NEGATIVE
PH: 7 (ref 5.0–8.0)
Protein, ur: NEGATIVE mg/dL
SPECIFIC GRAVITY, URINE: 1.011 (ref 1.005–1.030)

## 2016-03-08 LAB — CBC WITH DIFFERENTIAL/PLATELET
BASOS ABS: 0 10*3/uL (ref 0.0–0.1)
BASOS PCT: 0 %
EOS ABS: 0 10*3/uL (ref 0.0–0.7)
Eosinophils Relative: 0 %
HEMATOCRIT: 35 % — AB (ref 36.0–46.0)
HEMOGLOBIN: 11.2 g/dL — AB (ref 12.0–15.0)
Lymphocytes Relative: 20 %
Lymphs Abs: 1.9 10*3/uL (ref 0.7–4.0)
MCH: 28.8 pg (ref 26.0–34.0)
MCHC: 32 g/dL (ref 30.0–36.0)
MCV: 90 fL (ref 78.0–100.0)
MONOS PCT: 9 %
Monocytes Absolute: 0.8 10*3/uL (ref 0.1–1.0)
NEUTROS ABS: 6.5 10*3/uL (ref 1.7–7.7)
NEUTROS PCT: 71 %
Platelets: 196 10*3/uL (ref 150–400)
RBC: 3.89 MIL/uL (ref 3.87–5.11)
RDW: 14.6 % (ref 11.5–15.5)
WBC: 9.1 10*3/uL (ref 4.0–10.5)

## 2016-03-08 LAB — LACTIC ACID, PLASMA: LACTIC ACID, VENOUS: 1.5 mmol/L (ref 0.5–1.9)

## 2016-03-08 LAB — I-STAT CG4 LACTIC ACID, ED
LACTIC ACID, VENOUS: 2.02 mmol/L — AB (ref 0.5–1.9)
LACTIC ACID, VENOUS: 1 mmol/L (ref 0.5–1.9)

## 2016-03-08 MED ORDER — ENOXAPARIN SODIUM 40 MG/0.4ML ~~LOC~~ SOLN
40.0000 mg | SUBCUTANEOUS | Status: DC
  Administered 2016-03-08: 40 mg via SUBCUTANEOUS
  Filled 2016-03-08 (×2): qty 0.4

## 2016-03-08 MED ORDER — ROSUVASTATIN CALCIUM 10 MG PO TABS
10.0000 mg | ORAL_TABLET | Freq: Every day | ORAL | Status: DC
  Administered 2016-03-09 – 2016-03-16 (×8): 10 mg via ORAL
  Filled 2016-03-08 (×8): qty 1

## 2016-03-08 MED ORDER — CLONAZEPAM 0.5 MG PO TABS
0.5000 mg | ORAL_TABLET | Freq: Two times a day (BID) | ORAL | Status: DC
  Administered 2016-03-08: 0.5 mg via ORAL
  Filled 2016-03-08: qty 1

## 2016-03-08 MED ORDER — INSULIN ASPART 100 UNIT/ML ~~LOC~~ SOLN: 0.0000 [IU] | Freq: Every day | SUBCUTANEOUS | Status: DC

## 2016-03-08 MED ORDER — ALBUTEROL SULFATE (2.5 MG/3ML) 0.083% IN NEBU: 2.5000 mg | INHALATION_SOLUTION | Freq: Four times a day (QID) | RESPIRATORY_TRACT | Status: DC | PRN

## 2016-03-08 MED ORDER — INSULIN ASPART 100 UNIT/ML ~~LOC~~ SOLN
0.0000 [IU] | Freq: Three times a day (TID) | SUBCUTANEOUS | Status: DC
  Administered 2016-03-09: 2 [IU] via SUBCUTANEOUS
  Administered 2016-03-10 – 2016-03-12 (×3): 1 [IU] via SUBCUTANEOUS
  Administered 2016-03-13: 2 [IU] via SUBCUTANEOUS
  Administered 2016-03-14 – 2016-03-16 (×4): 1 [IU] via SUBCUTANEOUS

## 2016-03-08 MED ORDER — FLUOXETINE HCL 20 MG PO CAPS: 40.0000 mg | ORAL_CAPSULE | Freq: Every day | ORAL | Status: DC

## 2016-03-08 MED ORDER — ALBUTEROL SULFATE HFA 108 (90 BASE) MCG/ACT IN AERS: 2.0000 | INHALATION_SPRAY | Freq: Four times a day (QID) | RESPIRATORY_TRACT | Status: DC | PRN

## 2016-03-08 MED ORDER — IBUPROFEN 400 MG PO TABS: 400.0000 mg | ORAL_TABLET | Freq: Four times a day (QID) | ORAL | Status: DC | PRN

## 2016-03-08 MED ORDER — POLYETHYLENE GLYCOL 3350 17 G PO PACK: 17.0000 g | PACK | Freq: Every day | ORAL | Status: DC | PRN

## 2016-03-08 MED ORDER — SODIUM CHLORIDE 0.9 % IV BOLUS (SEPSIS)
1000.0000 mL | Freq: Once | INTRAVENOUS | Status: AC
  Administered 2016-03-08: 1000 mL via INTRAVENOUS

## 2016-03-08 MED ORDER — ACETAMINOPHEN 325 MG PO TABS
650.0000 mg | ORAL_TABLET | Freq: Four times a day (QID) | ORAL | Status: DC | PRN
  Administered 2016-03-09 – 2016-03-11 (×5): 650 mg via ORAL
  Filled 2016-03-08 (×5): qty 2

## 2016-03-08 MED ORDER — IOPAMIDOL (ISOVUE-300) INJECTION 61%
INTRAVENOUS | Status: AC
  Administered 2016-03-08: 75 mL
  Filled 2016-03-08: qty 75

## 2016-03-08 MED ORDER — AMOXICILLIN 500 MG PO CAPS
500.0000 mg | ORAL_CAPSULE | Freq: Three times a day (TID) | ORAL | Status: DC
  Administered 2016-03-08 – 2016-03-09 (×2): 500 mg via ORAL
  Filled 2016-03-08 (×3): qty 1

## 2016-03-08 MED ORDER — ONDANSETRON HCL 4 MG PO TABS: 4.0000 mg | ORAL_TABLET | Freq: Four times a day (QID) | ORAL | Status: DC | PRN

## 2016-03-08 MED ORDER — ACETAMINOPHEN 650 MG RE SUPP: 650.0000 mg | Freq: Four times a day (QID) | RECTAL | Status: DC | PRN

## 2016-03-08 MED ORDER — ACETAMINOPHEN 325 MG PO TABS
650.0000 mg | ORAL_TABLET | Freq: Once | ORAL | Status: AC | PRN
  Administered 2016-03-08: 650 mg via ORAL
  Filled 2016-03-08: qty 2

## 2016-03-08 MED ORDER — ONDANSETRON HCL 4 MG/2ML IJ SOLN: 4.0000 mg | Freq: Four times a day (QID) | INTRAMUSCULAR | Status: DC | PRN

## 2016-03-08 MED ORDER — SODIUM CHLORIDE 0.9 % IV SOLN
INTRAVENOUS | Status: DC
  Administered 2016-03-09 – 2016-03-10 (×2): via INTRAVENOUS
  Administered 2016-03-10: 1000 mL via INTRAVENOUS
  Administered 2016-03-10 – 2016-03-11 (×2): via INTRAVENOUS

## 2016-03-08 NOTE — ED Notes (Signed)
Attempted report 

## 2016-03-08 NOTE — ED Triage Notes (Addendum)
14 teeth extracted Monday; on amoxicillin. 2 days of low back ache, increasing not feeling well and lethargy, today temp 101 and feeling dizziness. EMS reports orthostatic (sit 132/78, stand 106/37). Chills on arrival. A&O x4. Family arrived... Reports increasing back pain and fell this morning leaning over due to back pain.

## 2016-03-08 NOTE — Progress Notes (Signed)
Pt just arrived  To 5W14, CT done.

## 2016-03-08 NOTE — ED Notes (Signed)
Patient returned from CT. ED tech at bedside preparing to transport to floor.

## 2016-03-08 NOTE — ED Provider Notes (Signed)
[facility]-EMERGENCY DEPT Provider Note   CSN: [id number] Arrival date & time: 03/08/16  1151     History   Chief Complaint Chief Complaint  Patient presents with  . Fever    HPI Latasha Wang is a 75 y.o. female.  HPI   Patient was initially level V caveat due to confusion.  Latasha Wang is a 75 y.o. female, with a history of anxiety and HTN, presenting to the ED with fever, weakness, urinary incontinence and confusion that came on this morning. Was complaining of right lower back pain yesterday. Daughter states patient is never confused and has no dementia. Pt lives with the daughter, Latasha Wang, who is at the bedside.   Pt had 14 teeth extracted on Sept 11 in order to prepare for fitting of dentures. Given 500 mg Amoxicillin QID for 10 days. Has been compliant.   Patient and patient's daughter deny vomiting, abdominal pain, chest pain, shortness of breath, falls/trauma, or any other complaints.   Past Medical History:  Diagnosis Date  . ANXIETY   . DEPRESSION   . Diabetes mellitus   . FATTY LIVER DISEASE   . Gastroparesis   . GERD   . HYPERLIPIDEMIA   . HYPERTENSION   . LATERAL EPICONDYLITIS, RIGHT     Patient Active Problem List   Diagnosis Date Noted  . Fever 03/08/2016  . Acute kidney injury (HCC) 03/08/2016  . Hyponatremia 03/08/2016  . Acute encephalopathy 03/08/2016  . Elevated lactic acid level 03/08/2016  . Memory dysfunction 06/13/2013  . Humerus distal fracture, left 02/19/2012  . Encounter for preventative adult health care exam with abnormal findings 12/10/2010  . LATERAL EPICONDYLITIS, RIGHT 12/17/2008  . Anxiety state 06/20/2008  . Gastroparesis 09/01/2007  . FATTY LIVER DISEASE 09/01/2007  . VERTIGO, CHRONIC 09/01/2007  . LIVER FUNCTION TESTS, ABNORMAL 07/19/2007  . Hyperlipidemia 07/05/2007  . Other malaise and fatigue 07/05/2007  . Diabetes (HCC) 05/09/2007  . Depression 05/09/2007  . Essential hypertension 05/09/2007  . GERD 05/09/2007  .  MYALGIA 05/09/2007    Past Surgical History:  Procedure Laterality Date  . CATARACT EXTRACTION, BILATERAL    . TOTAL ELBOW ARTHROPLASTY  02/19/2012   Procedure: TOTAL ELBOW ARTHROPLASTY;  Surgeon: Budd Palmer, MD;  Location: [facility] OR;  Service: Orthopedics;  Laterality: Left;    OB History    No data available       Home Medications    Prior to Admission medications   Medication Sig Start Date End Date Taking? Authorizing Provider  acetaminophen (TYLENOL) 500 MG tablet Take 1-2 tablets (500-1,000 mg total) by mouth every 6 (six) hours as needed for pain or fever. For pain 02/19/12   Montez Morita, PA-C  albuterol (PROVENTIL HFA;VENTOLIN HFA) 108 (90 BASE) MCG/ACT inhaler Inhale 2 puffs into the lungs every 6 (six) hours as needed for wheezing. 08/12/12   Newt Lukes, MD  amLODipine (NORVASC) 10 MG tablet Take 10 mg by mouth daily.    Historical Provider, MD  amLODipine (NORVASC) 10 MG tablet TAKE 1 TABLET BY MOUTH EVERY DAY 10/10/15   Corwin Levins, MD  buPROPion (WELLBUTRIN XL) 150 MG 24 hr tablet Take 1 tablet (150 mg total) by mouth daily. 01/08/15   Corwin Levins, MD  buPROPion (WELLBUTRIN XL) 150 MG 24 hr tablet TAKE 1 TABLET BY MOUTH DAILY 01/20/16   Corwin Levins, MD  clonazePAM (KLONOPIN) 0.5 MG tablet TAKE 1 TABLET BY MOUTH TWICE A DAY AS NEEDED 01/28/16   Len Blalock  John, MD  CRESTOR 10 MG tablet TAKE 1 TABLET (10 MG TOTAL) BY MOUTH DAILY. 09/30/15   Corwin LevinsJames W John, MD  fexofenadine (ALLEGRA) 180 MG tablet Take 1 tablet (180 mg total) by mouth daily. 12/12/13   Corwin LevinsJames W John, MD  FLUoxetine (PROZAC) 40 MG capsule TAKE ONE CAPSULE BY MOUTH DAILY 03/08/15   Corwin LevinsJames W John, MD  hydrochlorothiazide (MICROZIDE) 12.5 MG capsule Take 1 capsule (12.5 mg total) by mouth daily. 03/11/15   Corwin LevinsJames W John, MD  hydrochlorothiazide (MICROZIDE) 12.5 MG capsule TAKE ONE CAPSULE BY MOUTH EVERY DAY 12/16/15   Corwin LevinsJames W John, MD  irbesartan (AVAPRO) 300 MG tablet TAKE 1 TABLET (300 MG TOTAL) BY MOUTH AT BEDTIME.  12/11/15   Corwin LevinsJames W John, MD  meclizine (ANTIVERT) 12.5 MG tablet Take 1-2 tablets (12.5-25 mg total) by mouth every 6 (six) hours as needed. For dizziness 12/26/15   Corwin LevinsJames W John, MD  metFORMIN (GLUCOPHAGE-XR) 500 MG 24 hr tablet Take 500 mg by mouth 2 (two) times daily.    Historical Provider, MD  metFORMIN (GLUCOPHAGE-XR) 500 MG 24 hr tablet TAKE 1 TABLET BY MOUTH TWICE A DAY 12/11/15   Corwin LevinsJames W John, MD  metFORMIN (GLUMETZA) 500 MG (MOD) 24 hr tablet Take 500 mg by mouth daily with breakfast.    Historical Provider, MD  OLANZapine (ZYPREXA) 5 MG tablet Take 1 tablet (5 mg total) by mouth daily. 02/20/16   Corwin LevinsJames W John, MD  pioglitazone (ACTOS) 45 MG tablet TAKE 1 TABLET (45 MG TOTAL) BY MOUTH DAILY. 04/08/15   Corwin LevinsJames W John, MD  rosuvastatin (CRESTOR) 20 MG tablet Take 1 tablet (20 mg total) by mouth daily. 12/13/13   Corwin LevinsJames W John, MD    Family History History reviewed. No pertinent family history.  Social History Social History  Substance Use Topics  . Smoking status: Never Smoker  . Smokeless tobacco: Never Used  . Alcohol use No     Allergies   Review of patient's allergies indicates no known allergies.   Review of Systems Review of Systems  Constitutional: Positive for fever.  Respiratory: Negative for shortness of breath.   Cardiovascular: Negative for chest pain.  Gastrointestinal: Negative for abdominal pain, blood in stool, constipation, diarrhea, nausea and vomiting.  Musculoskeletal: Positive for back pain.  Skin: Negative for color change, pallor, rash and wound.  Neurological: Positive for weakness. Negative for dizziness, seizures, syncope, numbness and headaches.  All other systems reviewed and are negative.   Physical Exam Updated Vital Signs BP 132/64 (BP Location: Right Arm)   Pulse 82   Temp (!) 103.2 F (39.6 C) (Oral)   Resp 23   Ht 4\' 9"  (1.448 m)   Wt 77.1 kg   SpO2 98%   BMI 36.79 kg/m   Physical Exam  Constitutional: She appears well-developed  and well-nourished. No distress.  HENT:  Head: Normocephalic and atraumatic.  Mouth and gums appear to be healing well. No area of swelling, erythema, or fluctuance to suggest infection or abscess. No facial swelling noted.   Eyes: Conjunctivae and EOM are normal. Pupils are equal, round, and reactive to light.  Neck: Normal range of motion. Neck supple.  Cardiovascular: Normal rate, regular rhythm, normal heart sounds and intact distal pulses.   Pulmonary/Chest: Effort normal and breath sounds normal. No respiratory distress.  Abdominal: Soft. There is no tenderness. There is CVA tenderness (right). There is no guarding.  Musculoskeletal: She exhibits no edema or tenderness.  Motor intact x4 extremities. No  midline spinal tenderness.  Lymphadenopathy:    She has no cervical adenopathy.  Neurological: She is alert.  Patient is oriented to self and location, but not to time or situation. Follows all commands. No sensory deficits. Strength 5/5 in all extremities. Coordination intact. Cranial nerves III-XII grossly intact. No facial droop.   Skin: Skin is warm and dry. She is not diaphoretic.  Psychiatric: She has a normal mood and affect. Her behavior is normal.  Nursing note and vitals reviewed.    ED Treatments / Results  Labs (all labs ordered are listed, but only abnormal results are displayed) Labs Reviewed  COMPREHENSIVE METABOLIC PANEL - Abnormal; Notable for the following:       Result Value   Sodium 131 (*)    Chloride 100 (*)    Glucose, Bld 115 (*)    Creatinine, Ser 1.16 (*)    Calcium 8.7 (*)    Total Bilirubin 2.1 (*)    GFR calc non Af Amer 45 (*)    GFR calc Af Amer 52 (*)    All other components within normal limits  CBC WITH DIFFERENTIAL/PLATELET - Abnormal; Notable for the following:    Hemoglobin 11.2 (*)    HCT 35.0 (*)    All other components within normal limits  I-STAT CG4 LACTIC ACID, ED - Abnormal; Notable for the following:    Lactic Acid, Venous  2.02 (*)    All other components within normal limits  CULTURE, BLOOD (ROUTINE X 2)  CULTURE, BLOOD (ROUTINE X 2)  URINE CULTURE  URINALYSIS, ROUTINE W REFLEX MICROSCOPIC (NOT AT [facility])  HEMOGLOBIN A1C  I-STAT CG4 LACTIC ACID, ED    EKG  EKG Interpretation None       Radiology Dg Chest 2 View  Result Date: 03/08/2016 CLINICAL DATA:  Chest pain for 2 years, hypertension, diabetes mellitus EXAM: CHEST  2 VIEW COMPARISON:  08/11/2012 FINDINGS: Enlargement of cardiac silhouette. Calcified tortuous aorta. Pulmonary vascularity and mediastinal contours otherwise normal. Minimal RIGHT basilar atelectasis. Lungs otherwise clear. No pleural effusion or pneumothorax. Diffuse osseous demineralization. IMPRESSION: Enlargement of cardiac silhouette. Minimal RIGHT basilar atelectasis. Aortic atherosclerosis. Electronically Signed   By: Ulyses Southward M.D.   On: 03/08/2016 13:19    Procedures Procedures (including critical care time)  Medications Ordered in ED Medications  sodium chloride 0.9 % bolus 1,000 mL (1,000 mLs Intravenous New Bag/Given 03/08/16 1541)  rosuvastatin (CRESTOR) tablet 10 mg (not administered)  FLUoxetine (PROZAC) capsule 40 mg (not administered)  albuterol (PROVENTIL HFA;VENTOLIN HFA) 108 (90 Base) MCG/ACT inhaler 2 puff (not administered)  enoxaparin (LOVENOX) injection 40 mg (not administered)  0.9 %  sodium chloride infusion (not administered)  acetaminophen (TYLENOL) tablet 650 mg (not administered)    Or  acetaminophen (TYLENOL) suppository 650 mg (not administered)  ibuprofen (ADVIL,MOTRIN) tablet 400 mg (not administered)  polyethylene glycol (MIRALAX / GLYCOLAX) packet 17 g (not administered)  ondansetron (ZOFRAN) tablet 4 mg (not administered)    Or  ondansetron (ZOFRAN) injection 4 mg (not administered)  insulin aspart (novoLOG) injection 0-9 Units (not administered)  insulin aspart (novoLOG) injection 0-5 Units (not administered)  acetaminophen (TYLENOL)  tablet 650 mg (650 mg Oral Given 03/08/16 1231)  sodium chloride 0.9 % bolus 1,000 mL (0 mLs Intravenous Stopped 03/08/16 1445)     Initial Impression / Assessment and Plan / ED Course  I have reviewed the triage vital signs and the nursing notes.  Pertinent labs & imaging results that were available during my care of  the patient were reviewed by me and considered in my medical decision making (see chart for details).  Clinical Course   Patient with onset of fever, weakness, urinary incontinence, and confusion this morning. Last seen normal was last night around 10 PM.  Findings and plan of care discussed with Lynden Oxford, MD. Dr. Rush Landmark personally evaluated and examined this patient.  Patient with a fever of unknown origin. No noted source. Once patient was rehydrated with fluids and her fever was reduced, confusion resolved. Normal neuro exam. Does not present as cauda equina or meningitis. No signs of infection in the mouth. Patient will need to be admitted for fever of unknown origin workup. 3:51 PM Spoke with Toya Smothers, [facility], who agreed to admit the patient to Telemetry observation under attending Carolyne Littles.  Vitals:   03/08/16 1400 03/08/16 1500 03/08/16 1530 03/08/16 1600  BP: 133/63 123/55 107/63 (!) 132/49  Pulse: 72 70 66 71  Resp: 23 20 22 22   Temp:      TempSrc:      SpO2: 97% 98% 97% 96%  Weight:      Height:         Final Clinical Impressions(s) / ED Diagnoses   Final diagnoses:  Fever, unspecified fever cause    New Prescriptions New Prescriptions   No medications on file     Concepcion Living 03/08/16 1606    Canary Brim Tegeler, MD 03/08/16 2206

## 2016-03-08 NOTE — ED Notes (Signed)
Patient transported to CT 

## 2016-03-08 NOTE — ED Notes (Signed)
Patient transported to X-ray 

## 2016-03-08 NOTE — H&P (Signed)
History and Physical    Latasha Wang ZOX:[id number] DOB: 05/21/1941 DOA: 03/08/2016  PCP: Oliver BarreJames John, MD Patient coming from: home  Chief Complaint: fever  HPI: Latasha Wang is a pleasant 75 y.o. AF with medical history significant for diabetes, hypertension, fatty liver disease, depression, anxiety recent extraction of 14 teeth presents to the emergency department with chief complaint of fever. Initial evaluation reveals a temperature of 103.2 orally, elevated lactic acid, hyponatremia, acute kidney injury and acute encephalopathy.  Information is obtained from the patient and the daughter who is at the bedside. 2 days ago she had 14 teeth extracted anticipation of dentures. Daughter states done yesterday reported that "a couple of teeth had decay in a couple had abscesses". She was sent home with prednisone taper which is completed and amoxicillin. Over the last 2 days she became gradually more lethargic and fevers. This morning patient was unable to bear weight became incontinent of urine. She is symptoms include decreased oral intake over the last 2 days complaints of low back pain. He has not had any pain medicine for 2 days. She has been taken her antibiotics. No complaints of headache dizziness syncope or near-syncope. No complaints of chest pain palpitation shortness of breath. No cough no dysuria hematuria frequency or urgency. No diarrhea abdominal pain nausea vomiting.   ED Course: The emergency department she is a temperature of 103.2 orally she's hemodynamically stable not hypoxic. She is provided with 2 L of normal saline.  Review of Systems: As per HPI otherwise 10 point review of systems negative.   Ambulatory Status: She ambulates independently no recent falls.  Past Medical History:  Diagnosis Date  . ANXIETY   . DEPRESSION   . Diabetes mellitus   . FATTY LIVER DISEASE   . Gastroparesis   . GERD   . HYPERLIPIDEMIA   . HYPERTENSION   . LATERAL EPICONDYLITIS, RIGHT       Past Surgical History:  Procedure Laterality Date  . CATARACT EXTRACTION, BILATERAL    . TOTAL ELBOW ARTHROPLASTY  02/19/2012   Procedure: TOTAL ELBOW ARTHROPLASTY;  Surgeon: Budd PalmerMichael H Handy, MD;  Location: [facility] OR;  Service: Orthopedics;  Laterality: Left;    Social History   Social History  . Marital status: Divorced    Spouse name: N/A  . Number of children: 6  . Years of education: N/A   Occupational History  .  Retired   Social History Main Topics  . Smoking status: Never Smoker  . Smokeless tobacco: Never Used  . Alcohol use No  . Drug use: No  . Sexual activity: Not on file   Other Topics Concern  . Not on file   Social History Narrative  . No narrative on file    No Known Allergies  History reviewed. No pertinent family history.  Prior to Admission medications   Medication Sig Start Date End Date Taking? Authorizing Provider  amLODipine (NORVASC) 10 MG tablet TAKE 1 TABLET BY MOUTH EVERY DAY 10/10/15  Yes Corwin LevinsJames W John, MD  amoxicillin (AMOXIL) 500 MG tablet Take 500 mg by mouth 4 (four) times daily. Before meals and at bedtime 03/02/16  Yes Historical Provider, MD  buPROPion (WELLBUTRIN XL) 150 MG 24 hr tablet TAKE 1 TABLET BY MOUTH DAILY 01/20/16  Yes Corwin LevinsJames W John, MD  clonazePAM (KLONOPIN) 0.5 MG tablet TAKE 1 TABLET BY MOUTH TWICE A DAY AS NEEDED Patient taking differently: TAKE 1 TABLET BY MOUTH TWICE A DAY 01/28/16  Yes Corwin LevinsJames W John,  MD  CRESTOR 10 MG tablet TAKE 1 TABLET (10 MG TOTAL) BY MOUTH DAILY. 09/30/15  Yes Corwin Levins, MD  FLUoxetine (PROZAC) 40 MG capsule TAKE ONE CAPSULE BY MOUTH DAILY Patient taking differently: TAKE ONE CAPSULE BY MOUTH DAILY AT BEDTIME 03/08/15  Yes Corwin Levins, MD  hydrochlorothiazide (MICROZIDE) 12.5 MG capsule TAKE ONE CAPSULE BY MOUTH EVERY DAY 12/16/15  Yes Corwin Levins, MD  HYDROcodone-acetaminophen (NORCO) 10-325 MG tablet Take 1 tablet by mouth every 3 (three) hours as needed (pain).  02/27/16  Yes Historical Provider, MD   irbesartan (AVAPRO) 300 MG tablet TAKE 1 TABLET (300 MG TOTAL) BY MOUTH AT BEDTIME. 12/11/15  Yes Corwin Levins, MD  ketoconazole (NIZORAL) 2 % shampoo Apply 1 application topically every Friday. 02/25/16  Yes Historical Provider, MD  meclizine (ANTIVERT) 12.5 MG tablet Take 1-2 tablets (12.5-25 mg total) by mouth every 6 (six) hours as needed. For dizziness Patient taking differently: Take 12.5-25 mg by mouth every 6 (six) hours as needed for dizziness (vertigo). For dizziness 12/26/15  Yes Corwin Levins, MD  metFORMIN (GLUCOPHAGE-XR) 500 MG 24 hr tablet TAKE 1 TABLET BY MOUTH TWICE A DAY 12/11/15  Yes Corwin Levins, MD  OLANZapine (ZYPREXA) 5 MG tablet Take 1 tablet (5 mg total) by mouth daily. 02/20/16  Yes Corwin Levins, MD  pioglitazone (ACTOS) 45 MG tablet TAKE 1 TABLET (45 MG TOTAL) BY MOUTH DAILY. 04/08/15  Yes Corwin Levins, MD  acetaminophen (TYLENOL) 500 MG tablet Take 1-2 tablets (500-1,000 mg total) by mouth every 6 (six) hours as needed for pain or fever. For pain Patient not taking: Reported on 03/08/2016 02/19/12   Montez Morita, PA-C  albuterol (PROVENTIL HFA;VENTOLIN HFA) 108 (90 BASE) MCG/ACT inhaler Inhale 2 puffs into the lungs every 6 (six) hours as needed for wheezing. Patient not taking: Reported on 03/08/2016 08/12/12   Newt Lukes, MD  buPROPion (WELLBUTRIN XL) 150 MG 24 hr tablet Take 1 tablet (150 mg total) by mouth daily. Patient not taking: Reported on 03/08/2016 01/08/15   Corwin Levins, MD  fexofenadine (ALLEGRA) 180 MG tablet Take 1 tablet (180 mg total) by mouth daily. Patient not taking: Reported on 03/08/2016 12/12/13   Corwin Levins, MD  hydrochlorothiazide (MICROZIDE) 12.5 MG capsule Take 1 capsule (12.5 mg total) by mouth daily. Patient not taking: Reported on 03/08/2016 03/11/15   Corwin Levins, MD  rosuvastatin (CRESTOR) 20 MG tablet Take 1 tablet (20 mg total) by mouth daily. Patient not taking: Reported on 03/08/2016 12/13/13   Corwin Levins, MD    Physical Exam: Vitals:    03/08/16 1400 03/08/16 1500 03/08/16 1530 03/08/16 1600  BP: 133/63 123/55 107/63 (!) 132/49  Pulse: 72 70 66 71  Resp: 23 20 22 22   Temp:      TempSrc:      SpO2: 97% 98% 97% 96%  Weight:      Height:         General:  Appears calm and comfortable Eyes:  PERRL, EOMI, normal lids, iris ENT:  grossly normal hearing, lips & tongue, Mouth and gone's without swelling/erythema. Inspection no evidence of infection or abscessfacial swelling Neck:  no LAD, masses or thyromegaly Cardiovascular:  RRR, no m/r/g. No LE edema.  Respiratory:  CTA bilaterally, no w/r/r. Normal respiratory effort. Abdomen:  soft, ntnd, positive bowel sounds no guarding or rebounding Skin:  no rash or induration seen on limited exam Musculoskeletal:  grossly normal tone BUE/BLE,  good ROM, no bony abnormality Psychiatric:  grossly normal mood and affect, speech fluent and appropriate, AOx3 Neurologic:  CN 2-12 grossly intact, moves all extremities in coordinated fashion, sensation intact  Labs on Admission: I have personally reviewed following labs and imaging studies  CBC:  Recent Labs Lab 03/08/16 1222  WBC 9.1  NEUTROABS 6.5  HGB 11.2*  HCT 35.0*  MCV 90.0  PLT 196   Basic Metabolic Panel:  Recent Labs Lab 03/08/16 1222  NA 131*  K 3.7  CL 100*  CO2 23  GLUCOSE 115*  BUN 16  CREATININE 1.16*  CALCIUM 8.7*   GFR: Estimated Creatinine Clearance (by C-G formula based on SCr of 1.16 mg/dL (H)) Female: 16.1 mL/min Female: 44.8 mL/min Liver Function Tests:  Recent Labs Lab 03/08/16 1222  AST 22  ALT 18  ALKPHOS 44  BILITOT 2.1*  PROT 7.3  ALBUMIN 3.8   No results for input(s): LIPASE, AMYLASE in the last 168 hours. No results for input(s): AMMONIA in the last 168 hours. Coagulation Profile: No results for input(s): INR, PROTIME in the last 168 hours. Cardiac Enzymes: No results for input(s): CKTOTAL, CKMB, CKMBINDEX, TROPONINI in the last 168 hours. BNP (last 3 results) No results  for input(s): PROBNP in the last 8760 hours. HbA1C: No results for input(s): HGBA1C in the last 72 hours. CBG: No results for input(s): GLUCAP in the last 168 hours. Lipid Profile: No results for input(s): CHOL, HDL, LDLCALC, TRIG, CHOLHDL, LDLDIRECT in the last 72 hours. Thyroid Function Tests: No results for input(s): TSH, T4TOTAL, FREET4, T3FREE, THYROIDAB in the last 72 hours. Anemia Panel: No results for input(s): VITAMINB12, FOLATE, FERRITIN, TIBC, IRON, RETICCTPCT in the last 72 hours. Urine analysis:    Component Value Date/Time   COLORURINE YELLOW 03/08/2016 1452   APPEARANCEUR CLEAR 03/08/2016 1452   LABSPEC 1.011 03/08/2016 1452   PHURINE 7.0 03/08/2016 1452   GLUCOSEU NEGATIVE 03/08/2016 1452   GLUCOSEU NEGATIVE 12/11/2015 0838   HGBUR NEGATIVE 03/08/2016 1452   BILIRUBINUR NEGATIVE 03/08/2016 1452   KETONESUR NEGATIVE 03/08/2016 1452   PROTEINUR NEGATIVE 03/08/2016 1452   UROBILINOGEN 0.2 12/11/2015 0838   NITRITE NEGATIVE 03/08/2016 1452   LEUKOCYTESUR NEGATIVE 03/08/2016 1452    Creatinine Clearance: Estimated Creatinine Clearance (by C-G formula based on SCr of 1.16 mg/dL (H)) Female: 09.6 mL/min Female: 44.8 mL/min  Sepsis Labs: @LABRCNTIP (procalcitonin:4,lacticidven:4) )No results found for this or any previous visit (from the past 240 hour(s)).   Radiological Exams on Admission: Dg Chest 2 View  Result Date: 03/08/2016 CLINICAL DATA:  Chest pain for 2 years, hypertension, diabetes mellitus EXAM: CHEST  2 VIEW COMPARISON:  08/11/2012 FINDINGS: Enlargement of cardiac silhouette. Calcified tortuous aorta. Pulmonary vascularity and mediastinal contours otherwise normal. Minimal RIGHT basilar atelectasis. Lungs otherwise clear. No pleural effusion or pneumothorax. Diffuse osseous demineralization. IMPRESSION: Enlargement of cardiac silhouette. Minimal RIGHT basilar atelectasis. Aortic atherosclerosis. Electronically Signed   By: Ulyses Southward M.D.   On: 03/08/2016  13:19    EKG: Independently reviewed. Sinus rhythm  Assessment/Plan Principal Problem:   Acute encephalopathy Active Problems:   Diabetes (HCC)   Anxiety state   Essential hypertension   GERD   Fever   Acute kidney injury (HCC)   Hyponatremia   Elevated lactic acid level   #1. Acute encephalopathy in the setting of high fever hyponatremia acute kidney injury mild dehydration. Much improved at the time of my exam after fluids and Tylenol. Neuro exam benign. No focal deficits -Admit -Monitor closely -Manage  fever -Obtain B-12 and folate -Vigorous IV fluids -Recheck electrolytes in the morning -If she does not continue to improve consider imaging  #2. Fever. Etiology uncertain (does not meet sepsis criteria). Urinalysis and chest x-ray unremarkable. She did have 14 teeth extracted 2 days ago. Reportedly due to abscesses and decay. She's been on amoxicillin ever since. Lactic acid mildly elevated. She is hemodynamically stable nontoxic appearing -Continue vigorous IV fluids -Follow lactic acid -Obtain a CT maxillofacial rule out abscess - continue amoxicillian for now -Repeat chest x-ray in the morning -Tylenol as needed  3. Acute kidney injury. Likely related to above and decreased oral intake over the last 2 days. -Hold nephrotoxins -Monitor urine output -Recheck in the morning -If no improvement consider renal ultrasound  #4. Hyponatremia. Mild. Related to above -IV fluids as noted above -Recheck  #5. Hypertension. Blood pressure slightly soft in the emergency department. Home medications include amlodipine, hydrochlorothiazide,irbesartan -We will hold home meds for now -Monitor blood pressure -We'll resume amlodipine and ARB when indicated  #6. Diabetes Type 2 uncontrolled with complication. Patient is on oral agents. Serum glucose 115 on admission. -We'll obtain a hemoglobin A1c -Sliding scale insulin for optimal control -Hold oral agents for now as her oral  intake is unreliable -Full liquid diet secondary to recent tooth extraction (14 teeth)  #7. Anxiety/depression. Appears stable at baseline. Home medications include Prozac and clonazepam. Patient reports taking clonazepam bid -resume home meds  8. Status post teeth extraction. She had 14 teeth extracted 2 days ago in anticipation of dentures -See above -Pain management -Continue amoxicillin for now     DVT prophylaxis: lovenox  Code Status: full  Family Communication: daughter at bedside  Disposition Plan: home  Consults called: none  Admission status: obs    Gwenyth Bender MD [facility]  If 7PM-7AM, please contact night-coverage [email or web address] Password [facility]  03/08/2016, 4:54 PM   Care during the described time interval was provided by NP Toya Smothers and myself .  I have reviewed this patient's available data, including medical history, events of note, physical examination, and all test results as part of my evaluation. I have personally reviewed and interpreted all radiology studies.

## 2016-03-09 ENCOUNTER — Observation Stay (HOSPITAL_COMMUNITY): Payer: Medicare Other

## 2016-03-09 DIAGNOSIS — I10 Essential (primary) hypertension: Secondary | ICD-10-CM | POA: Diagnosis present

## 2016-03-09 DIAGNOSIS — E785 Hyperlipidemia, unspecified: Secondary | ICD-10-CM | POA: Diagnosis present

## 2016-03-09 DIAGNOSIS — E1143 Type 2 diabetes mellitus with diabetic autonomic (poly)neuropathy: Secondary | ICD-10-CM | POA: Diagnosis present

## 2016-03-09 DIAGNOSIS — Z79899 Other long term (current) drug therapy: Secondary | ICD-10-CM | POA: Diagnosis not present

## 2016-03-09 DIAGNOSIS — Z9842 Cataract extraction status, left eye: Secondary | ICD-10-CM | POA: Diagnosis not present

## 2016-03-09 DIAGNOSIS — K3184 Gastroparesis: Secondary | ICD-10-CM | POA: Diagnosis present

## 2016-03-09 DIAGNOSIS — F411 Generalized anxiety disorder: Secondary | ICD-10-CM | POA: Diagnosis present

## 2016-03-09 DIAGNOSIS — M79609 Pain in unspecified limb: Secondary | ICD-10-CM | POA: Diagnosis not present

## 2016-03-09 DIAGNOSIS — Z6836 Body mass index (BMI) 36.0-36.9, adult: Secondary | ICD-10-CM | POA: Diagnosis not present

## 2016-03-09 DIAGNOSIS — Z7984 Long term (current) use of oral hypoglycemic drugs: Secondary | ICD-10-CM | POA: Diagnosis not present

## 2016-03-09 DIAGNOSIS — R32 Unspecified urinary incontinence: Secondary | ICD-10-CM | POA: Diagnosis present

## 2016-03-09 DIAGNOSIS — R410 Disorientation, unspecified: Secondary | ICD-10-CM | POA: Diagnosis not present

## 2016-03-09 DIAGNOSIS — E119 Type 2 diabetes mellitus without complications: Secondary | ICD-10-CM | POA: Diagnosis not present

## 2016-03-09 DIAGNOSIS — K760 Fatty (change of) liver, not elsewhere classified: Secondary | ICD-10-CM | POA: Diagnosis present

## 2016-03-09 DIAGNOSIS — G9340 Encephalopathy, unspecified: Secondary | ICD-10-CM | POA: Diagnosis not present

## 2016-03-09 DIAGNOSIS — E1165 Type 2 diabetes mellitus with hyperglycemia: Secondary | ICD-10-CM | POA: Diagnosis present

## 2016-03-09 DIAGNOSIS — R509 Fever, unspecified: Secondary | ICD-10-CM | POA: Diagnosis present

## 2016-03-09 DIAGNOSIS — E860 Dehydration: Secondary | ICD-10-CM | POA: Diagnosis present

## 2016-03-09 DIAGNOSIS — K219 Gastro-esophageal reflux disease without esophagitis: Secondary | ICD-10-CM | POA: Diagnosis present

## 2016-03-09 DIAGNOSIS — F329 Major depressive disorder, single episode, unspecified: Secondary | ICD-10-CM | POA: Diagnosis present

## 2016-03-09 DIAGNOSIS — E871 Hypo-osmolality and hyponatremia: Secondary | ICD-10-CM | POA: Diagnosis present

## 2016-03-09 DIAGNOSIS — G92 Toxic encephalopathy: Secondary | ICD-10-CM | POA: Diagnosis present

## 2016-03-09 DIAGNOSIS — K0889 Other specified disorders of teeth and supporting structures: Secondary | ICD-10-CM | POA: Diagnosis not present

## 2016-03-09 DIAGNOSIS — Z9841 Cataract extraction status, right eye: Secondary | ICD-10-CM | POA: Diagnosis not present

## 2016-03-09 DIAGNOSIS — A419 Sepsis, unspecified organism: Secondary | ICD-10-CM | POA: Diagnosis present

## 2016-03-09 DIAGNOSIS — N179 Acute kidney failure, unspecified: Secondary | ICD-10-CM | POA: Diagnosis present

## 2016-03-09 DIAGNOSIS — E118 Type 2 diabetes mellitus with unspecified complications: Secondary | ICD-10-CM | POA: Diagnosis not present

## 2016-03-09 DIAGNOSIS — E876 Hypokalemia: Secondary | ICD-10-CM | POA: Diagnosis not present

## 2016-03-09 LAB — URINALYSIS, ROUTINE W REFLEX MICROSCOPIC
Bilirubin Urine: NEGATIVE
Glucose, UA: NEGATIVE mg/dL
Ketones, ur: 15 mg/dL — AB
Nitrite: NEGATIVE
PH: 5.5 (ref 5.0–8.0)
PROTEIN: NEGATIVE mg/dL
Specific Gravity, Urine: 1.024 (ref 1.005–1.030)

## 2016-03-09 LAB — CBC
HCT: 30.6 % — ABNORMAL LOW (ref 36.0–46.0)
Hemoglobin: 9.9 g/dL — ABNORMAL LOW (ref 12.0–15.0)
MCH: 28.7 pg (ref 26.0–34.0)
MCHC: 32.4 g/dL (ref 30.0–36.0)
MCV: 88.7 fL (ref 78.0–100.0)
PLATELETS: 175 10*3/uL (ref 150–400)
RBC: 3.45 MIL/uL — AB (ref 3.87–5.11)
RDW: 14.7 % (ref 11.5–15.5)
WBC: 9.9 10*3/uL (ref 4.0–10.5)

## 2016-03-09 LAB — GLUCOSE, CAPILLARY
GLUCOSE-CAPILLARY: 156 mg/dL — AB (ref 65–99)
Glucose-Capillary: 109 mg/dL — ABNORMAL HIGH (ref 65–99)
Glucose-Capillary: 109 mg/dL — ABNORMAL HIGH (ref 65–99)

## 2016-03-09 LAB — BASIC METABOLIC PANEL
Anion gap: 11 (ref 5–15)
BUN: 15 mg/dL (ref 6–20)
CALCIUM: 8.2 mg/dL — AB (ref 8.9–10.3)
CO2: 19 mmol/L — AB (ref 22–32)
CREATININE: 1.47 mg/dL — AB (ref 0.44–1.00)
Chloride: 106 mmol/L (ref 101–111)
GFR, EST AFRICAN AMERICAN: 39 mL/min — AB (ref >60)
GFR, EST NON AFRICAN AMERICAN: 34 mL/min — AB (ref >60)
Glucose, Bld: 120 mg/dL — ABNORMAL HIGH (ref 65–99)
Potassium: 3.5 mmol/L (ref 3.5–5.1)
SODIUM: 136 mmol/L (ref 135–145)

## 2016-03-09 LAB — HEMOGLOBIN A1C
HEMOGLOBIN A1C: 6.3 % — AB (ref 4.8–5.6)
MEAN PLASMA GLUCOSE: 134 mg/dL

## 2016-03-09 LAB — URINE MICROSCOPIC-ADD ON

## 2016-03-09 LAB — URINE CULTURE: CULTURE: NO GROWTH

## 2016-03-09 LAB — SEDIMENTATION RATE: Sed Rate: 35 mm/hr — ABNORMAL HIGH (ref 0–22)

## 2016-03-09 MED ORDER — PIPERACILLIN-TAZOBACTAM 3.375 G IVPB
3.3750 g | Freq: Three times a day (TID) | INTRAVENOUS | Status: DC
  Administered 2016-03-09 – 2016-03-10 (×4): 3.375 g via INTRAVENOUS
  Filled 2016-03-09 (×6): qty 50

## 2016-03-09 MED ORDER — VANCOMYCIN HCL IN DEXTROSE 1-5 GM/200ML-% IV SOLN
1000.0000 mg | INTRAVENOUS | Status: DC
  Administered 2016-03-09 – 2016-03-10 (×2): 1000 mg via INTRAVENOUS
  Filled 2016-03-09 (×2): qty 200

## 2016-03-09 MED ORDER — ENOXAPARIN SODIUM 30 MG/0.3ML ~~LOC~~ SOLN
30.0000 mg | SUBCUTANEOUS | Status: DC
  Administered 2016-03-09: 30 mg via SUBCUTANEOUS
  Filled 2016-03-09: qty 0.3

## 2016-03-09 MED ORDER — IBUPROFEN 600 MG PO TABS
600.0000 mg | ORAL_TABLET | Freq: Once | ORAL | Status: AC | PRN
  Administered 2016-03-09: 600 mg via ORAL
  Filled 2016-03-09: qty 1

## 2016-03-09 NOTE — Progress Notes (Signed)
ANTIBIOTIC CONSULT NOTE - INITIAL  Pharmacy Consult for Vancomycin and Zosyn Indication: sepsis  No Known Allergies  Patient Measurements: Height: 4\' 9"  (144.8 cm) Weight: 166 lb 14.2 oz (75.7 kg) IBW/kg (Calculated) : 38.6  Vital Signs: Temp: 101.3 F (38.5 C) (09/18 0800) Temp Source: Oral (09/18 0800) BP: 124/62 (09/18 0402) Pulse Rate: 82 (09/18 0402) Intake/Output from previous day: 09/17 0701 - 09/18 0700 In: 2000 [IV Piggyback:2000] Out: 1 [Urine:1] Intake/Output from this shift: No intake/output data recorded.  Labs:  Recent Labs  03/08/16 1222 03/09/16 0639  WBC 9.1 9.9  HGB 11.2* 9.9*  PLT 196 175  CREATININE 1.16* 1.47*   Estimated Creatinine Clearance (by C-G formula based on SCr of 1.47 mg/dL (H)) Female: 09.828.3 mL/min Female: 35 mL/min No results for input(s): VANCOTROUGH, VANCOPEAK, VANCORANDOM, GENTTROUGH, GENTPEAK, GENTRANDOM, TOBRATROUGH, TOBRAPEAK, TOBRARND, AMIKACINPEAK, AMIKACINTROU, AMIKACIN in the last 72 hours.   Microbiology: 9/17 Stool Culture: Pending 9/17 Blood Cultures: Pending 9/17 Urine Culture: Pending  Medical History: Past Medical History:  Diagnosis Date  . ANXIETY   . DEPRESSION   . Diabetes mellitus   . FATTY LIVER DISEASE   . Gastroparesis   . GERD   . HYPERLIPIDEMIA   . HYPERTENSION   . LATERAL EPICONDYLITIS, RIGHT     Medications:  Scheduled:  . enoxaparin (LOVENOX) injection  40 mg Subcutaneous Q24H  . insulin aspart  0-5 Units Subcutaneous QHS  . insulin aspart  0-9 Units Subcutaneous TID WC  . piperacillin-tazobactam (ZOSYN)  IV  3.375 g Intravenous Q8H  . rosuvastatin  10 mg Oral Daily  . vancomycin  1,000 mg Intravenous Q24H   Assessment: Patient is febrile (Tmax 103.2). WBC WNL. Lactic acid elevated upon arrival, now WNL. SCr is 1.47 and trending up. Patient on amoxicillin PTA. Patient is being treated for suspected sepsis s/p multiple teeth extraction (14 total) on 03/02/16.  Goal of Therapy:   Vancomycin trough level 15-20 mcg/ml  Plan: Vancomycin 1,000 mg IV q24 hours Zosyn 3.375 g IV q8 hours Follow up culture results and narrow therapy as appropriate Obtain vancomycin trough level at steady state Monitor renal function and clinical picture  Samara DeistKathryn Shakiya Mcneary 03/09/2016,10:22 AM

## 2016-03-09 NOTE — Progress Notes (Signed)
PROGRESS NOTE    Latasha Wang  ZOX:[id number] DOB: 12/23/1940 DOA: 03/08/2016 PCP: Latasha BarreJames John, MD Brief Narrative:Latasha Wang is a pleasant 75 y.o. AF with medical history significant for diabetes, hypertension, fatty liver disease, depression, anxiety recent extraction of 14 teeth on 9/11 presented to the emergency department with chief complaint of fever. Initial evaluation reveals a temperature of 103.2 orally, elevated lactic acid, hyponatremia, acute kidney injury and acute encephalopathy  Assessment & Plan:   Sepsis -likely due to recent extraction of 14teeth, some with abscesses,  -Stop Amoxicillin, start IV Zosyn -CT maxillofacial without abscess -IVF -FU Blood cultures and check ECHO-at risk of endocarditis, will add Empiric Vanc too for now -lactate cleared  Toxic metabolic encephalopathy -due to above infection, improving with Abx -no meningeal signs, no neck stiffness -CT head unremarkable  DM -stable, SSI for now  HTN -low normal, holding HCTZ/Amlodipine and ARB  Anxiety -hold benzos given lethargy  AKI -due to sepsis, dehydration with Diuretic and ARB use -hydrate, hold above meds and monitor  Hyponatremia -due to dehydration/sepsis, -continue IVF  DVT prophylaxis: Hep SQ Code Status:Full COde Family Communication:d//w son at bedside Disposition Plan:Home when improved      Antimicrobials:Amoxicliin 9/17-9/18  Vanc/Zosyn 9/18->   Subjective: Very drowsy, arousable and answers some questions  Objective: Vitals:   03/09/16 0605 03/09/16 0800 03/09/16 1047 03/09/16 1217  BP:      Pulse:      Resp:      Temp: (!) 102.1 F (38.9 C) (!) 101.3 F (38.5 C) 100.2 F (37.9 C) (!) 102.9 F (39.4 C)  TempSrc: Oral Oral Oral Oral  SpO2:      Weight:      Height:        Intake/Output Summary (Last 24 hours) at 03/09/16 1247 Last data filed at 03/09/16 0100  Gross per 24 hour  Intake             2000 ml  Output                1 ml  Net              1999 ml   Filed Weights   03/08/16 1207 03/09/16 0402  Weight: 77.1 kg (170 lb) 75.7 kg (166 lb 14.2 oz)    Examination:  General exam: somnolent, arousable,  HEENT: Edentulous, swelling of gums noted, sutures noted Respiratory system: Clear to auscultation. Respiratory effort normal. Cardiovascular system: S1 & S2 heard, RRR. No JVD, murmurs, rubs, gallops or clicks. No pedal edema. Gastrointestinal system: Abdomen is nondistended, soft and nontender. No organomegaly or masses felt. Normal bowel sounds heard. Central nervous system: somnolent Extremities: Symmetric 5 x 5 power. Skin: No rashes, lesions or ulcers Psychiatry: unable to assess    Data Reviewed: I have personally reviewed following labs and imaging studies  CBC:  Recent Labs Lab 03/08/16 1222 03/09/16 0639  WBC 9.1 9.9  NEUTROABS 6.5  --   HGB 11.2* 9.9*  HCT 35.0* 30.6*  MCV 90.0 88.7  PLT 196 175   Basic Metabolic Panel:  Recent Labs Lab 03/08/16 1222 03/09/16 0639  NA 131* 136  K 3.7 3.5  CL 100* 106  CO2 23 19*  GLUCOSE 115* 120*  BUN 16 15  CREATININE 1.16* 1.47*  CALCIUM 8.7* 8.2*   GFR: Estimated Creatinine Clearance (by C-G formula based on SCr of 1.47 mg/dL (H)) Female: 81.128.3 mL/min Female: 35 mL/min Liver Function Tests:  Recent Labs Lab 03/08/16 1222  AST 22  ALT 18  ALKPHOS 44  BILITOT 2.1*  PROT 7.3  ALBUMIN 3.8   No results for input(s): LIPASE, AMYLASE in the last 168 hours. No results for input(s): AMMONIA in the last 168 hours. Coagulation Profile: No results for input(s): INR, PROTIME in the last 168 hours. Cardiac Enzymes: No results for input(s): CKTOTAL, CKMB, CKMBINDEX, TROPONINI in the last 168 hours. BNP (last 3 results) No results for input(s): PROBNP in the last 8760 hours. HbA1C:  Recent Labs  03/08/16 1732  HGBA1C 6.3*   CBG:  Recent Labs Lab 03/08/16 2158 03/09/16 0922 03/09/16 1214  GLUCAP 156* 109* 156*   Lipid Profile: No  results for input(s): CHOL, HDL, LDLCALC, TRIG, CHOLHDL, LDLDIRECT in the last 72 hours. Thyroid Function Tests: No results for input(s): TSH, T4TOTAL, FREET4, T3FREE, THYROIDAB in the last 72 hours. Anemia Panel: No results for input(s): VITAMINB12, FOLATE, FERRITIN, TIBC, IRON, RETICCTPCT in the last 72 hours. Urine analysis:    Component Value Date/Time   COLORURINE YELLOW 03/08/2016 1452   APPEARANCEUR CLEAR 03/08/2016 1452   LABSPEC 1.011 03/08/2016 1452   PHURINE 7.0 03/08/2016 1452   GLUCOSEU NEGATIVE 03/08/2016 1452   GLUCOSEU NEGATIVE 12/11/2015 0838   HGBUR NEGATIVE 03/08/2016 1452   BILIRUBINUR NEGATIVE 03/08/2016 1452   KETONESUR NEGATIVE 03/08/2016 1452   PROTEINUR NEGATIVE 03/08/2016 1452   UROBILINOGEN 0.2 12/11/2015 0838   NITRITE NEGATIVE 03/08/2016 1452   LEUKOCYTESUR NEGATIVE 03/08/2016 1452   Sepsis Labs: @LABRCNTIP (procalcitonin:4,lacticidven:4)  ) Recent Results (from the past 240 hour(s))  Urine culture     Status: None   Collection Time: 03/08/16  2:52 PM  Result Value Ref Range Status   Specimen Description URINE, RANDOM  Final   Special Requests NONE  Final   Culture NO GROWTH  Final   Report Status 03/09/2016 FINAL  Final         Radiology Studies: Dg Chest 2 View  Result Date: 03/08/2016 CLINICAL DATA:  Chest pain for 2 years, hypertension, diabetes mellitus EXAM: CHEST  2 VIEW COMPARISON:  08/11/2012 FINDINGS: Enlargement of cardiac silhouette. Calcified tortuous aorta. Pulmonary vascularity and mediastinal contours otherwise normal. Minimal RIGHT basilar atelectasis. Lungs otherwise clear. No pleural effusion or pneumothorax. Diffuse osseous demineralization. IMPRESSION: Enlargement of cardiac silhouette. Minimal RIGHT basilar atelectasis. Aortic atherosclerosis. Electronically Signed   By: Ulyses Southward M.D.   On: 03/08/2016 13:19   Ct Head Wo Contrast  Result Date: 03/09/2016 CLINICAL DATA:  Fever have recent dental extractions possible  sepsis EXAM: CT HEAD WITHOUT CONTRAST TECHNIQUE: Contiguous axial images were obtained from the base of the skull through the vertex without intravenous contrast. COMPARISON:  CT brain scan of 10/30/2011 FINDINGS: Brain: The ventricular system is unchanged in size and configuration compared to the prior CT, and there is only mild cortical atrophy present. The septum remains midline in position. The fourth ventricle and basilar cisterns are unchanged. Benign-appearing basal ganglial calcifications are present bilaterally. No hemorrhage, mass lesion, or acute infarction is seen. Vascular: On this unenhanced study, no vascular abnormality is noted. Skull: On bone window images, no calvarial abnormality is seen. Sinuses/Orbits: The paranasal sinuses appear well pneumatized. No sinusitis is noted. Other: None IMPRESSION: No acute intracranial abnormality.  Minimal atrophy for age Electronically Signed   By: Dwyane Dee M.D.   On: 03/09/2016 11:34   Ct Maxillofacial W Contrast  Result Date: 03/08/2016 CLINICAL DATA:  75 year old female post teeth extraction 6 days ago. Fever. Initial encounter. EXAM: CT MAXILLOFACIAL WITH CONTRAST  TECHNIQUE: Multidetector CT imaging of the maxillofacial structures was performed with intravenous contrast. Multiplanar CT image reconstructions were also generated. A small metallic BB was placed on the right temple in order to reliably differentiate right from left. CONTRAST:  75mL ISOVUE-300 IOPAMIDOL (ISOVUE-300) INJECTION 61% COMPARISON:  None. FINDINGS: Osseous: Post extraction of multiple teeth more notable involving lower teeth and greater on the right. In the extraction sites, there is poorly defined soft tissue and gas which may represent expected changes although postoperative inflammation not excluded in proper clinical setting. Orbits: Negative. Sinuses: Minimal mucosal thickening right frontal sinus and ethmoid sinus air cells. Soft tissues: No deep drainable abscess. Limited  intracranial: Intracranial vascular calcifications. Hyperostosis frontalis interna. IMPRESSION: Post extraction of multiple teeth more notable involving lower teeth and greater on the right. In the extraction sites, there is poorly defined soft tissue and gas which may represent expected changes although postoperative inflammation not excluded in proper clinical setting. No deep drainable abscess. Electronically Signed   By: Lacy Duverney M.D.   On: 03/08/2016 21:02   Portable Chest 1 View  Result Date: 03/09/2016 CLINICAL DATA:  Fever EXAM: PORTABLE CHEST 1 VIEW COMPARISON:  03/08/2016 FINDINGS: Cardiomegaly with vascular congestion. Low lung volumes. No confluent opacities or overt edema. No effusions or acute bony abnormality. IMPRESSION: Cardiomegaly, vascular congestion.  Low lung volumes. Electronically Signed   By: Charlett Nose M.D.   On: 03/09/2016 07:53        Scheduled Meds: . enoxaparin (LOVENOX) injection  30 mg Subcutaneous Q24H  . insulin aspart  0-5 Units Subcutaneous QHS  . insulin aspart  0-9 Units Subcutaneous TID WC  . piperacillin-tazobactam (ZOSYN)  IV  3.375 g Intravenous Q8H  . rosuvastatin  10 mg Oral Daily  . vancomycin  1,000 mg Intravenous Q24H   Continuous Infusions: . sodium chloride       LOS: 0 days    Time spent:    Zannie Cove, MD [facility] Pager [phone]  If 7PM-7AM, please contact night-coverage [email or web address] Password [facility] 03/09/2016, 12:47 PM

## 2016-03-09 NOTE — Progress Notes (Addendum)
Pt was having continuous bouts of soft bowel movements, After about 5-6 within 3hrs, MD was notified. He put in stool study, flexi seal order and C-diff. The stool's consistency continued and lab advised that the C-diff can not be completed on the stool sample. Stool study result pending. While the flexiseal was in place, pt had a soft formed stool around it. Stool could not collect in the flexiseal. Flexiseal was taken out and pt had another BM. Pt was cleaned, sleeping well and will continue to be monitor.  Pt was having fever of 103.39F. Reassessed to 102.6516f after Tylenol was given. MD notified. Pt was also appeared a little more confused, disoriented to all except self on reassessment. MD was notified. Selena BattenKim, MD, put in orders for Echo, MR Lumbar spine , Blood culture and sedimentation rate.  Will continue to monitor.

## 2016-03-09 NOTE — Care Management Note (Signed)
Case Management Note  Patient Details  Name: Latasha Wang MRN: [id number] Date of Birth: 12/12/1940  Subjective/Objective:                 Patienyt in obs fpr acute encephalopathy. Patient fro home with family, had multiple tooth extractions least week, septic, rule out endocarditis, febrile to 101.3 this AM, IVF, IV Abx.   Action/Plan:  CM will continue to follow for DC planning.  Expected Discharge Date:                  Expected Discharge Plan:     In-House Referral:     Discharge planning Services  CM Consult  Post Acute Care Choice:    Choice offered to:     DME Arranged:    DME Agency:     HH Arranged:    HH Agency:     Status of Service:  In process, will continue to follow  If discussed at Long Length of Stay Meetings, dates discussed:    Additional Comments:  Lawerance SabalDebbie Garold Sheeler, RN 03/09/2016, 1:42 PM

## 2016-03-10 ENCOUNTER — Inpatient Hospital Stay (HOSPITAL_COMMUNITY): Payer: Medicare Other

## 2016-03-10 DIAGNOSIS — A419 Sepsis, unspecified organism: Principal | ICD-10-CM

## 2016-03-10 DIAGNOSIS — N179 Acute kidney failure, unspecified: Secondary | ICD-10-CM

## 2016-03-10 DIAGNOSIS — R509 Fever, unspecified: Secondary | ICD-10-CM

## 2016-03-10 LAB — BASIC METABOLIC PANEL
ANION GAP: 9 (ref 5–15)
BUN: 9 mg/dL (ref 6–20)
CALCIUM: 8.2 mg/dL — AB (ref 8.9–10.3)
CO2: 23 mmol/L (ref 22–32)
CREATININE: 1.02 mg/dL — AB (ref 0.44–1.00)
Chloride: 106 mmol/L (ref 101–111)
GFR, EST NON AFRICAN AMERICAN: 53 mL/min — AB (ref >60)
Glucose, Bld: 121 mg/dL — ABNORMAL HIGH (ref 65–99)
Potassium: 3.2 mmol/L — ABNORMAL LOW (ref 3.5–5.1)
SODIUM: 138 mmol/L (ref 135–145)

## 2016-03-10 LAB — CBC
HCT: 32.3 % — ABNORMAL LOW (ref 36.0–46.0)
Hemoglobin: 10.1 g/dL — ABNORMAL LOW (ref 12.0–15.0)
MCH: 28 pg (ref 26.0–34.0)
MCHC: 31.3 g/dL (ref 30.0–36.0)
MCV: 89.5 fL (ref 78.0–100.0)
PLATELETS: 180 10*3/uL (ref 150–400)
RBC: 3.61 MIL/uL — ABNORMAL LOW (ref 3.87–5.11)
RDW: 14.7 % (ref 11.5–15.5)
WBC: 8.3 10*3/uL (ref 4.0–10.5)

## 2016-03-10 LAB — GLUCOSE, CAPILLARY
GLUCOSE-CAPILLARY: 109 mg/dL — AB (ref 65–99)
GLUCOSE-CAPILLARY: 122 mg/dL — AB (ref 65–99)
GLUCOSE-CAPILLARY: 91 mg/dL (ref 65–99)
Glucose-Capillary: 104 mg/dL — ABNORMAL HIGH (ref 65–99)

## 2016-03-10 LAB — ECHOCARDIOGRAM COMPLETE
Height: 57 in
Weight: 2670.21 oz

## 2016-03-10 MED ORDER — IBUPROFEN 600 MG PO TABS
600.0000 mg | ORAL_TABLET | Freq: Four times a day (QID) | ORAL | Status: DC | PRN
  Administered 2016-03-10 (×2): 600 mg via ORAL
  Filled 2016-03-10 (×2): qty 1

## 2016-03-10 MED ORDER — SODIUM CHLORIDE 0.9 % IV SOLN
3.0000 g | Freq: Three times a day (TID) | INTRAVENOUS | Status: DC
  Administered 2016-03-10 – 2016-03-11 (×3): 3 g via INTRAVENOUS
  Filled 2016-03-10 (×5): qty 3

## 2016-03-10 MED ORDER — ENOXAPARIN SODIUM 40 MG/0.4ML ~~LOC~~ SOLN
40.0000 mg | SUBCUTANEOUS | Status: DC
  Administered 2016-03-10 – 2016-03-15 (×6): 40 mg via SUBCUTANEOUS
  Filled 2016-03-10 (×6): qty 0.4

## 2016-03-10 NOTE — Progress Notes (Signed)
Dr. Jomarie LongsJoseph paged to make aware of patient's temperature remaining at 102.4 after prn tylenol given.

## 2016-03-10 NOTE — Consult Note (Signed)
[facility]  Date of Admission:  03/08/2016  Date of Consult:  03/10/2016  Reason for Consult: Fever Referring Physician: Dr. Broadus John  Impression/Recommendation FUO Sepsis Acute encephalopathy Possible Dental Space Infection DM HTN AKI  Would Stop vanco Switch zosyn to unasyn Check quant Gold Check uric acid level  Check u/s of her neck to r/o septic jugular thrombophlebitis Consider MRI and neuro eval if persistent confusion If fever persists, CT chest/abd/pelvis.   Comment- Broad ddx- lemierre's, dental space infection, spread of infection from dental space?  BC x 2 9/17 ngtd BC x 2 9/18 ngtd UC negative  Thank you so much for this interesting consult,    Information obtained from a thorough medical chart review as the patient is currently confused and unreliable source of information at the time of this note.  No family at bedside.    Latasha Wang is an 75 y.o. female with a PMH of DM, HTN, fatty liver disease, HLD, GERD, anxiety, and depression admitted on 9/17 for fever with unknown source and acute encephalopathy.   Per the chart she lives with her daughter, Vaughan Basta, and is not normally confused.  She has a history of poor dentition and tooth abscesses with recent extraction on 9/11 of 14 teeth in preparation for dentures.  She completed a course of amoxicillin 512m QID for 10 days.  Around 9/15, family reported she started to become confused with fevers, generalized weakness, incontinent of urine, complained of right lower back pain, and had decreased oral intake.  She did not take any pain medication during this time. Family denied any shortness of breath, diarrhea vomiting, abdominal pain, or falls.    In the ED, her temp was noted to be 103.2, HR 82, BP 132/64, RR 23, and SpO2 98% on room air.  Initial labs showed a WBC 9.1 with normal differential, Na 131, glucose 115, sCr 1.16, total bili 2.1, Hgb 11.2, HCT 35, and lactic acid 2.02.   Urine and CXR negative.  CT maxillofacial without abscess. There was no meningeal signs.  CT of head was negative.  Blood and urine cultures sent.  She was treated with 2L of NS.  Her mental status had improved with rehydration and tylenol.   Her amoxicillin PO was continued.  On 9/18 she was switched to empiric IV vancomycin and zosyn. Sed rate 35.  Since admission, she has continued to have fevers, Tmax 102.4 with continued confusion.  Urine and blood cultures x 2 sets have no growth to date and ECHO is pending.  ID consulted for further recommendations of fever of unknown origin.      Past Medical History:  Diagnosis Date  . ANXIETY   . DEPRESSION   . Diabetes mellitus   . FATTY LIVER DISEASE   . Gastroparesis   . GERD   . HYPERLIPIDEMIA   . HYPERTENSION   . LATERAL EPICONDYLITIS, RIGHT     Past Surgical History:  Procedure Laterality Date  . CATARACT EXTRACTION, BILATERAL    . TOTAL ELBOW ARTHROPLASTY  02/19/2012   Procedure: TOTAL ELBOW ARTHROPLASTY;  Surgeon: MRozanna Box MD;  Location: [facility]  Service: Orthopedics;  Laterality: Left;     No Known Allergies  Medications: I have reviewed the patient's current medications.  Abtx:  Anti-infectives    Start     Dose/Rate Route Frequency Ordered Stop   03/09/16 1000  vancomycin (VANCOCIN) IVPB 1000 mg/200 mL premix     1,000 mg 200  mL/hr over 60 Minutes Intravenous Every 24 hours 03/09/16 0923     03/09/16 0930  piperacillin-tazobactam (ZOSYN) IVPB 3.375 g     3.375 g 12.5 mL/hr over 240 Minutes Intravenous Every 8 hours 03/09/16 0908     03/08/16 1715  amoxicillin (AMOXIL) capsule 500 mg  Status:  Discontinued     500 mg Oral Every 8 hours 03/08/16 1712 03/09/16 0900      Total days of antibiotics: amoxicillin (?started prior to or on 9/11), vanc 9/18, zosyn 9/18        Social History:  reports that she has never smoked. She has never used smokeless tobacco. She reports that she does not drink alcohol or use  drugs.  History reviewed. No pertinent family history.  ROS unable to obtained due to patient's confusion  Blood pressure (!) 86/51, pulse (!) 56, temperature 99.5 F (37.5 C), temperature source Oral, resp. rate 18, height '4\' 9"'  (1.448 m), weight 75.7 kg (166 lb 14.2 oz), SpO2 91 %. BP (!) 121/51 (BP Location: Left Arm)   Pulse (!) 55   Temp 99.5 F (37.5 C) (Oral)   Resp 15   Ht '4\' 9"'  (1.448 m)   Wt 75.7 kg (166 lb 14.2 oz)   SpO2 91%   BMI 36.11 kg/m     Patient is alert to voice, lying in bed in no acute distress Oriented to person and month, not to event, place; moves all extremities PERRL, scleral anicteric  Mucous Membranes moist, intact sutures noted to lower gum line. No expressable purulence. Non-tender.  RRR, no appreciable JVD, pulses 2+, no jugular cordis.  Lungs resonant throughout, even and non-labored Abd soft, +BS, nontender on exam No spinal or paraspinal tenderness on palpation, no CVA tenderness Skin warm, without rashes or edema    Results for orders placed or performed during the hospital encounter of 03/08/16 (from the past 48 hour(s))  I-Stat CG4 Lactic Acid, ED     Status: None   Collection Time: 03/08/16  4:02 PM  Result Value Ref Range   Lactic Acid, Venous 1.00 0.5 - 1.9 mmol/L  Lactic acid, plasma     Status: None   Collection Time: 03/08/16  5:20 PM  Result Value Ref Range   Lactic Acid, Venous 1.5 0.5 - 1.9 mmol/L  Hemoglobin A1c     Status: Abnormal   Collection Time: 03/08/16  5:32 PM  Result Value Ref Range   Hgb A1c MFr Bld 6.3 (H) 4.8 - 5.6 %    Comment: (NOTE)         Pre-diabetes: 5.7 - 6.4         Diabetes: >6.4         Glycemic control for adults with diabetes: <7.0    Mean Plasma Glucose 134 mg/dL    Comment: (NOTE) Performed At: [facility] [place], [place] [id number] Lindon Romp MD [id number]   Glucose, capillary     Status: Abnormal   Collection Time: 03/08/16  9:58 PM  Result Value  Ref Range   Glucose-Capillary 156 (H) 65 - 99 mg/dL  Culture, blood (routine x 2)     Status: None (Preliminary result)   Collection Time: 03/09/16  6:35 AM  Result Value Ref Range   Specimen Description BLOOD LEFT ARM    Special Requests BOTTLES DRAWN AEROBIC AND ANAEROBIC 5CC    Culture NO GROWTH 1 DAY    Report Status PENDING   Basic metabolic panel  Status: Abnormal   Collection Time: 03/09/16  6:39 AM  Result Value Ref Range   Sodium 136 135 - 145 mmol/L   Potassium 3.5 3.5 - 5.1 mmol/L   Chloride 106 101 - 111 mmol/L   CO2 19 (L) 22 - 32 mmol/L   Glucose, Bld 120 (H) 65 - 99 mg/dL   BUN 15 6 - 20 mg/dL   Creatinine, Ser 1.47 (H) 0.44 - 1.00 mg/dL   Calcium 8.2 (L) 8.9 - 10.3 mg/dL   GFR calc non Af Amer 34 (L) >60 mL/min   GFR calc Af Amer 39 (L) >60 mL/min    Comment: (NOTE) The eGFR has been calculated using the CKD EPI equation. This calculation has not been validated in all clinical situations. eGFR's persistently <60 mL/min signify possible Chronic Kidney Disease.    Anion gap 11 5 - 15  CBC     Status: Abnormal   Collection Time: 03/09/16  6:39 AM  Result Value Ref Range   WBC 9.9 4.0 - 10.5 K/uL   RBC 3.45 (L) 3.87 - 5.11 MIL/uL   Hemoglobin 9.9 (L) 12.0 - 15.0 g/dL   HCT 30.6 (L) 36.0 - 46.0 %   MCV 88.7 78.0 - 100.0 fL   MCH 28.7 26.0 - 34.0 pg   MCHC 32.4 30.0 - 36.0 g/dL   RDW 14.7 11.5 - 15.5 %   Platelets 175 150 - 400 K/uL  Sedimentation rate     Status: Abnormal   Collection Time: 03/09/16  6:39 AM  Result Value Ref Range   Sed Rate 35 (H) 0 - 22 mm/hr  Culture, blood (routine x 2)     Status: None (Preliminary result)   Collection Time: 03/09/16  6:54 AM  Result Value Ref Range   Specimen Description BLOOD LEFT HAND    Special Requests BOTTLES DRAWN AEROBIC AND ANAEROBIC 5CC    Culture NO GROWTH 1 DAY    Report Status PENDING   Glucose, capillary     Status: Abnormal   Collection Time: 03/09/16  9:22 AM  Result Value Ref Range    Glucose-Capillary 109 (H) 65 - 99 mg/dL  Glucose, capillary     Status: Abnormal   Collection Time: 03/09/16 12:14 PM  Result Value Ref Range   Glucose-Capillary 156 (H) 65 - 99 mg/dL  Urinalysis, Routine w reflex microscopic (not at [facility])     Status: Abnormal   Collection Time: 03/09/16  3:25 PM  Result Value Ref Range   Color, Urine YELLOW YELLOW   APPearance CLEAR CLEAR   Specific Gravity, Urine 1.024 1.005 - 1.030   pH 5.5 5.0 - 8.0   Glucose, UA NEGATIVE NEGATIVE mg/dL   Hgb urine dipstick SMALL (A) NEGATIVE   Bilirubin Urine NEGATIVE NEGATIVE   Ketones, ur 15 (A) NEGATIVE mg/dL   Protein, ur NEGATIVE NEGATIVE mg/dL   Nitrite NEGATIVE NEGATIVE   Leukocytes, UA SMALL (A) NEGATIVE  Urine microscopic-add on     Status: Abnormal   Collection Time: 03/09/16  3:25 PM  Result Value Ref Range   Squamous Epithelial / LPF 0-5 (A) NONE SEEN   WBC, UA 0-5 0 - 5 WBC/hpf   RBC / HPF 0-5 0 - 5 RBC/hpf   Bacteria, UA FEW (A) NONE SEEN  Glucose, capillary     Status: Abnormal   Collection Time: 03/09/16  5:05 PM  Result Value Ref Range   Glucose-Capillary 109 (H) 65 - 99 mg/dL  CBC     Status:  Abnormal   Collection Time: 03/10/16  4:43 AM  Result Value Ref Range   WBC 8.3 4.0 - 10.5 K/uL   RBC 3.61 (L) 3.87 - 5.11 MIL/uL   Hemoglobin 10.1 (L) 12.0 - 15.0 g/dL   HCT 32.3 (L) 36.0 - 46.0 %   MCV 89.5 78.0 - 100.0 fL   MCH 28.0 26.0 - 34.0 pg   MCHC 31.3 30.0 - 36.0 g/dL   RDW 14.7 11.5 - 15.5 %   Platelets 180 150 - 400 K/uL  Basic metabolic panel     Status: Abnormal   Collection Time: 03/10/16  4:43 AM  Result Value Ref Range   Sodium 138 135 - 145 mmol/L   Potassium 3.2 (L) 3.5 - 5.1 mmol/L   Chloride 106 101 - 111 mmol/L   CO2 23 22 - 32 mmol/L   Glucose, Bld 121 (H) 65 - 99 mg/dL   BUN 9 6 - 20 mg/dL   Creatinine, Ser 1.02 (H) 0.44 - 1.00 mg/dL   Calcium 8.2 (L) 8.9 - 10.3 mg/dL   GFR calc non Af Amer 53 (L) >60 mL/min   GFR calc Af Amer >60 >60 mL/min    Comment:  (NOTE) The eGFR has been calculated using the CKD EPI equation. This calculation has not been validated in all clinical situations. eGFR's persistently <60 mL/min signify possible Chronic Kidney Disease.    Anion gap 9 5 - 15  Glucose, capillary     Status: Abnormal   Collection Time: 03/10/16  7:57 AM  Result Value Ref Range   Glucose-Capillary 109 (H) 65 - 99 mg/dL      Component Value Date/Time   SDES BLOOD LEFT HAND 03/09/2016 0654   SPECREQUEST BOTTLES DRAWN AEROBIC AND ANAEROBIC 5CC 03/09/2016 0654   CULT NO GROWTH 1 DAY 03/09/2016 0654   REPTSTATUS PENDING 03/09/2016 0654   Ct Head Wo Contrast  Result Date: 03/09/2016 CLINICAL DATA:  Fever have recent dental extractions possible sepsis EXAM: CT HEAD WITHOUT CONTRAST TECHNIQUE: Contiguous axial images were obtained from the base of the skull through the vertex without intravenous contrast. COMPARISON:  CT brain scan of 10/30/2011 FINDINGS: Brain: The ventricular system is unchanged in size and configuration compared to the prior CT, and there is only mild cortical atrophy present. The septum remains midline in position. The fourth ventricle and basilar cisterns are unchanged. Benign-appearing basal ganglial calcifications are present bilaterally. No hemorrhage, mass lesion, or acute infarction is seen. Vascular: On this unenhanced study, no vascular abnormality is noted. Skull: On bone window images, no calvarial abnormality is seen. Sinuses/Orbits: The paranasal sinuses appear well pneumatized. No sinusitis is noted. Other: None IMPRESSION: No acute intracranial abnormality.  Minimal atrophy for age Electronically Signed   By: Ivar Drape M.D.   On: 03/09/2016 11:34   Ct Maxillofacial W Contrast  Result Date: 03/08/2016 CLINICAL DATA:  75 year old female post teeth extraction 6 days ago. Fever. Initial encounter. EXAM: CT MAXILLOFACIAL WITH CONTRAST TECHNIQUE: Multidetector CT imaging of the maxillofacial structures was performed with  intravenous contrast. Multiplanar CT image reconstructions were also generated. A small metallic BB was placed on the right temple in order to reliably differentiate right from left. CONTRAST:  14m ISOVUE-300 IOPAMIDOL (ISOVUE-300) INJECTION 61% COMPARISON:  None. FINDINGS: Osseous: Post extraction of multiple teeth more notable involving lower teeth and greater on the right. In the extraction sites, there is poorly defined soft tissue and gas which may represent expected changes although postoperative inflammation not excluded in proper  clinical setting. Orbits: Negative. Sinuses: Minimal mucosal thickening right frontal sinus and ethmoid sinus air cells. Soft tissues: No deep drainable abscess. Limited intracranial: Intracranial vascular calcifications. Hyperostosis frontalis interna. IMPRESSION: Post extraction of multiple teeth more notable involving lower teeth and greater on the right. In the extraction sites, there is poorly defined soft tissue and gas which may represent expected changes although postoperative inflammation not excluded in proper clinical setting. No deep drainable abscess. Electronically Signed   By: Genia Del M.D.   On: 03/08/2016 21:02   Portable Chest 1 View  Result Date: 03/09/2016 CLINICAL DATA:  Fever EXAM: PORTABLE CHEST 1 VIEW COMPARISON:  03/08/2016 FINDINGS: Cardiomegaly with vascular congestion. Low lung volumes. No confluent opacities or overt edema. No effusions or acute bony abnormality. IMPRESSION: Cardiomegaly, vascular congestion.  Low lung volumes. Electronically Signed   By: Rolm Baptise M.D.   On: 03/09/2016 07:53   Recent Results (from the past 240 hour(s))  Culture, blood (Routine x 2)     Status: None (Preliminary result)   Collection Time: 03/08/16 12:22 PM  Result Value Ref Range Status   Specimen Description BLOOD LEFT WRIST  Final   Special Requests BOTTLES DRAWN AEROBIC AND ANAEROBIC  5CC  Final   Culture NO GROWTH 2 DAYS  Final   Report Status  PENDING  Incomplete  Culture, blood (Routine x 2)     Status: None (Preliminary result)   Collection Time: 03/08/16 12:25 PM  Result Value Ref Range Status   Specimen Description BLOOD RIGHT WRIST  Final   Special Requests IN PEDIATRIC BOTTLE  3CC  Final   Culture NO GROWTH 2 DAYS  Final   Report Status PENDING  Incomplete  Urine culture     Status: None   Collection Time: 03/08/16  2:52 PM  Result Value Ref Range Status   Specimen Description URINE, RANDOM  Final   Special Requests NONE  Final   Culture NO GROWTH  Final   Report Status 03/09/2016 FINAL  Final  Culture, blood (routine x 2)     Status: None (Preliminary result)   Collection Time: 03/09/16  6:35 AM  Result Value Ref Range Status   Specimen Description BLOOD LEFT ARM  Final   Special Requests BOTTLES DRAWN AEROBIC AND ANAEROBIC 5CC  Final   Culture NO GROWTH 1 DAY  Final   Report Status PENDING  Incomplete  Culture, blood (routine x 2)     Status: None (Preliminary result)   Collection Time: 03/09/16  6:54 AM  Result Value Ref Range Status   Specimen Description BLOOD LEFT HAND  Final   Special Requests BOTTLES DRAWN AEROBIC AND ANAEROBIC 5CC  Final   Culture NO GROWTH 1 DAY  Final   Report Status PENDING  Incomplete      03/10/2016, 2:58 PM     LOS: 1 day    Records and images were personally review

## 2016-03-10 NOTE — Progress Notes (Signed)
  Echocardiogram 2D Echocardiogram has been performed.  Janalyn HarderWest, Keontae Levingston R 03/10/2016, 2:32 PM

## 2016-03-10 NOTE — Progress Notes (Signed)
PROGRESS NOTE    Tawana Scaleam T Mijangos  GNF:[id number] DOB: 02/16/1941 DOA: 03/08/2016 PCP: Oliver BarreJames John, MD Brief Narrative:Latasha Wang is a pleasant 75 y.o. AF with medical history significant for diabetes, hypertension, fatty liver disease, depression, anxiety recent extraction of 14 teeth on 9/11 presented to the emergency department with chief complaint of fever. Initial evaluation reveals a temperature of 103.2 orally, elevated lactic acid, hyponatremia, acute kidney injury and acute encephalopathy. Started on IV Vanc/Zosyn, Blood Cx pending, ID consulted, mentation improving  Assessment & Plan:   Sepsis/FUO -likely due to recent extraction of 14teeth, some with dental abscess prior to surgery  -Stop Amoxicillin, started IV Zosyn and Vancomycin on 9/18 -CT maxillofacial without abscess -IVF -mentation improving but continues to have high fevers -FU Blood cultures  -check ECHO-at risk of endocarditis -lactate cleared -ID consulted, d/w Dr.Hatcher  Toxic metabolic encephalopathy -due to above infection, improving with Abx -no meningeal signs, no neck stiffness -CT head unremarkable   DM -stable, SSI for now  HTN -stable, holding HCTZ/Amlodipine and ARB  Anxiety -hold benzos given lethargy  AKI -due to sepsis, dehydration with Diuretic and ARB use -resolved, continue IVF  Hyponatremia -due to dehydration/sepsis, -continue IVF  DVT prophylaxis: Hep SQ Code Status:Full COde Family Communication:d//w son at bedside Disposition Plan:Home when improved   Antimicrobials:Amoxicliin 9/17-9/18  Vanc/Zosyn 9/18->   Subjective: More alert, continues to have fever, no pain anywhere  Objective: Vitals:   03/10/16 0718 03/10/16 1008 03/10/16 1203 03/10/16 1338  BP:      Pulse:      Resp:      Temp: (!) 100.6 F (38.1 C) (!) 102.4 F (39.1 C) (!) 102.4 F (39.1 C) (!) 100.4 F (38 C)  TempSrc: Oral Oral Oral Oral  SpO2:      Weight:      Height:         Intake/Output Summary (Last 24 hours) at 03/10/16 1422 Last data filed at 03/10/16 1339  Gross per 24 hour  Intake             2915 ml  Output              200 ml  Net             2715 ml   Filed Weights   03/08/16 1207 03/09/16 0402  Weight: 77.1 kg (170 lb) 75.7 kg (166 lb 14.2 oz)    Examination:  General exam: more alert, answers questions, AAOx3 HEENT: Edentulous, swelling of gums noted, sutures noted Respiratory system: Clear to auscultation. Respiratory effort normal. Cardiovascular system: S1 & S2 heard, RRR. No JVD, murmurs, rubs, gallops or clicks. No pedal edema. Gastrointestinal system: Abdomen is nondistended, soft and nontender. No organomegaly or masses felt. Normal bowel sounds heard. Central nervous system:more alert, non focal Extremities: Symmetric 5 x 5 power. Skin: No rashes, lesions or ulcers Psychiatry: unable to assess    Data Reviewed: I have personally reviewed following labs and imaging studies  CBC:  Recent Labs Lab 03/08/16 1222 03/09/16 0639 03/10/16 0443  WBC 9.1 9.9 8.3  NEUTROABS 6.5  --   --   HGB 11.2* 9.9* 10.1*  HCT 35.0* 30.6* 32.3*  MCV 90.0 88.7 89.5  PLT 196 175 180   Basic Metabolic Panel:  Recent Labs Lab 03/08/16 1222 03/09/16 0639 03/10/16 0443  NA 131* 136 138  K 3.7 3.5 3.2*  CL 100* 106 106  CO2 23 19* 23  GLUCOSE 115* 120* 121*  BUN 16  15 9  CREATININE 1.16* 1.47* 1.02*  CALCIUM 8.7* 8.2* 8.2*   GFR: Estimated Creatinine Clearance (by C-G formula based on SCr of 1.02 mg/dL (H)) Female: 16.1 mL/min Female: 50.4 mL/min Liver Function Tests:  Recent Labs Lab 03/08/16 1222  AST 22  ALT 18  ALKPHOS 44  BILITOT 2.1*  PROT 7.3  ALBUMIN 3.8   No results for input(s): LIPASE, AMYLASE in the last 168 hours. No results for input(s): AMMONIA in the last 168 hours. Coagulation Profile: No results for input(s): INR, PROTIME in the last 168 hours. Cardiac Enzymes: No results for input(s): CKTOTAL,  CKMB, CKMBINDEX, TROPONINI in the last 168 hours. BNP (last 3 results) No results for input(s): PROBNP in the last 8760 hours. HbA1C:  Recent Labs  03/08/16 1732  HGBA1C 6.3*   CBG:  Recent Labs Lab 03/08/16 2158 03/09/16 0922 03/09/16 1214 03/09/16 1705 03/10/16 0757  GLUCAP 156* 109* 156* 109* 109*   Lipid Profile: No results for input(s): CHOL, HDL, LDLCALC, TRIG, CHOLHDL, LDLDIRECT in the last 72 hours. Thyroid Function Tests: No results for input(s): TSH, T4TOTAL, FREET4, T3FREE, THYROIDAB in the last 72 hours. Anemia Panel: No results for input(s): VITAMINB12, FOLATE, FERRITIN, TIBC, IRON, RETICCTPCT in the last 72 hours. Urine analysis:    Component Value Date/Time   COLORURINE YELLOW 03/09/2016 1525   APPEARANCEUR CLEAR 03/09/2016 1525   LABSPEC 1.024 03/09/2016 1525   PHURINE 5.5 03/09/2016 1525   GLUCOSEU NEGATIVE 03/09/2016 1525   GLUCOSEU NEGATIVE 12/11/2015 0838   HGBUR SMALL (A) 03/09/2016 1525   BILIRUBINUR NEGATIVE 03/09/2016 1525   KETONESUR 15 (A) 03/09/2016 1525   PROTEINUR NEGATIVE 03/09/2016 1525   UROBILINOGEN 0.2 12/11/2015 0838   NITRITE NEGATIVE 03/09/2016 1525   LEUKOCYTESUR SMALL (A) 03/09/2016 1525   Sepsis Labs: @LABRCNTIP (procalcitonin:4,lacticidven:4)  ) Recent Results (from the past 240 hour(s))  Culture, blood (Routine x 2)     Status: None (Preliminary result)   Collection Time: 03/08/16 12:22 PM  Result Value Ref Range Status   Specimen Description BLOOD LEFT WRIST  Final   Special Requests BOTTLES DRAWN AEROBIC AND ANAEROBIC  5CC  Final   Culture NO GROWTH 2 DAYS  Final   Report Status PENDING  Incomplete  Culture, blood (Routine x 2)     Status: None (Preliminary result)   Collection Time: 03/08/16 12:25 PM  Result Value Ref Range Status   Specimen Description BLOOD RIGHT WRIST  Final   Special Requests IN PEDIATRIC BOTTLE  3CC  Final   Culture NO GROWTH 2 DAYS  Final   Report Status PENDING  Incomplete  Urine culture      Status: None   Collection Time: 03/08/16  2:52 PM  Result Value Ref Range Status   Specimen Description URINE, RANDOM  Final   Special Requests NONE  Final   Culture NO GROWTH  Final   Report Status 03/09/2016 FINAL  Final  Culture, blood (routine x 2)     Status: None (Preliminary result)   Collection Time: 03/09/16  6:35 AM  Result Value Ref Range Status   Specimen Description BLOOD LEFT ARM  Final   Special Requests BOTTLES DRAWN AEROBIC AND ANAEROBIC 5CC  Final   Culture NO GROWTH 1 DAY  Final   Report Status PENDING  Incomplete  Culture, blood (routine x 2)     Status: None (Preliminary result)   Collection Time: 03/09/16  6:54 AM  Result Value Ref Range Status   Specimen Description BLOOD LEFT HAND  Final   Special Requests BOTTLES DRAWN AEROBIC AND ANAEROBIC 5CC  Final   Culture NO GROWTH 1 DAY  Final   Report Status PENDING  Incomplete         Radiology Studies: Ct Head Wo Contrast  Result Date: 03/09/2016 CLINICAL DATA:  Fever have recent dental extractions possible sepsis EXAM: CT HEAD WITHOUT CONTRAST TECHNIQUE: Contiguous axial images were obtained from the base of the skull through the vertex without intravenous contrast. COMPARISON:  CT brain scan of 10/30/2011 FINDINGS: Brain: The ventricular system is unchanged in size and configuration compared to the prior CT, and there is only mild cortical atrophy present. The septum remains midline in position. The fourth ventricle and basilar cisterns are unchanged. Benign-appearing basal ganglial calcifications are present bilaterally. No hemorrhage, mass lesion, or acute infarction is seen. Vascular: On this unenhanced study, no vascular abnormality is noted. Skull: On bone window images, no calvarial abnormality is seen. Sinuses/Orbits: The paranasal sinuses appear well pneumatized. No sinusitis is noted. Other: None IMPRESSION: No acute intracranial abnormality.  Minimal atrophy for age Electronically Signed   By: Dwyane Dee M.D.   On: 03/09/2016 11:34   Ct Maxillofacial W Contrast  Result Date: 03/08/2016 CLINICAL DATA:  75 year old female post teeth extraction 6 days ago. Fever. Initial encounter. EXAM: CT MAXILLOFACIAL WITH CONTRAST TECHNIQUE: Multidetector CT imaging of the maxillofacial structures was performed with intravenous contrast. Multiplanar CT image reconstructions were also generated. A small metallic BB was placed on the right temple in order to reliably differentiate right from left. CONTRAST:  75mL ISOVUE-300 IOPAMIDOL (ISOVUE-300) INJECTION 61% COMPARISON:  None. FINDINGS: Osseous: Post extraction of multiple teeth more notable involving lower teeth and greater on the right. In the extraction sites, there is poorly defined soft tissue and gas which may represent expected changes although postoperative inflammation not excluded in proper clinical setting. Orbits: Negative. Sinuses: Minimal mucosal thickening right frontal sinus and ethmoid sinus air cells. Soft tissues: No deep drainable abscess. Limited intracranial: Intracranial vascular calcifications. Hyperostosis frontalis interna. IMPRESSION: Post extraction of multiple teeth more notable involving lower teeth and greater on the right. In the extraction sites, there is poorly defined soft tissue and gas which may represent expected changes although postoperative inflammation not excluded in proper clinical setting. No deep drainable abscess. Electronically Signed   By: Lacy Duverney M.D.   On: 03/08/2016 21:02   Portable Chest 1 View  Result Date: 03/09/2016 CLINICAL DATA:  Fever EXAM: PORTABLE CHEST 1 VIEW COMPARISON:  03/08/2016 FINDINGS: Cardiomegaly with vascular congestion. Low lung volumes. No confluent opacities or overt edema. No effusions or acute bony abnormality. IMPRESSION: Cardiomegaly, vascular congestion.  Low lung volumes. Electronically Signed   By: Charlett Nose M.D.   On: 03/09/2016 07:53        Scheduled Meds: . enoxaparin  (LOVENOX) injection  40 mg Subcutaneous Q24H  . insulin aspart  0-5 Units Subcutaneous QHS  . insulin aspart  0-9 Units Subcutaneous TID WC  . piperacillin-tazobactam (ZOSYN)  IV  3.375 g Intravenous Q8H  . rosuvastatin  10 mg Oral Daily  . vancomycin  1,000 mg Intravenous Q24H   Continuous Infusions: . sodium chloride 100 mL/hr at 03/10/16 1221     LOS: 1 day    Time spent:    Zannie Cove, MD [facility] Pager [phone]  If 7PM-7AM, please contact night-coverage [email or web address] Password [email or web address] 03/10/2016, 2:22 PM

## 2016-03-11 ENCOUNTER — Telehealth: Payer: Self-pay | Admitting: *Deleted

## 2016-03-11 ENCOUNTER — Inpatient Hospital Stay (HOSPITAL_COMMUNITY): Payer: Medicare Other

## 2016-03-11 DIAGNOSIS — M79609 Pain in unspecified limb: Secondary | ICD-10-CM

## 2016-03-11 LAB — URIC ACID: Uric Acid, Serum: 2.7 mg/dL (ref 2.3–6.6)

## 2016-03-11 LAB — BASIC METABOLIC PANEL
Anion gap: 8 (ref 5–15)
CO2: 22 mmol/L (ref 22–32)
CREATININE: 0.81 mg/dL (ref 0.44–1.00)
Calcium: 8.2 mg/dL — ABNORMAL LOW (ref 8.9–10.3)
Chloride: 108 mmol/L (ref 101–111)
GFR calc Af Amer: >60 mL/min (ref >60)
GFR calc non Af Amer: >60 mL/min (ref >60)
GLUCOSE: 99 mg/dL (ref 65–99)
POTASSIUM: 3 mmol/L — AB (ref 3.5–5.1)
SODIUM: 138 mmol/L (ref 135–145)

## 2016-03-11 LAB — CBC
HCT: 30.5 % — ABNORMAL LOW (ref 36.0–46.0)
HEMOGLOBIN: 9.9 g/dL — AB (ref 12.0–15.0)
MCH: 28.9 pg (ref 26.0–34.0)
MCHC: 32.5 g/dL (ref 30.0–36.0)
MCV: 88.9 fL (ref 78.0–100.0)
Platelets: 182 10*3/uL (ref 150–400)
RBC: 3.43 MIL/uL — AB (ref 3.87–5.11)
RDW: 14.8 % (ref 11.5–15.5)
WBC: 8 10*3/uL (ref 4.0–10.5)

## 2016-03-11 LAB — FECAL LACTOFERRIN, QUANT: Lactoferrin, Fecal, Quant.: <1 ug/mL(g) (ref 0.00–7.24)

## 2016-03-11 LAB — GLUCOSE, CAPILLARY
Glucose-Capillary: 93 mg/dL (ref 65–99)
Glucose-Capillary: 123 mg/dL — ABNORMAL HIGH (ref 65–99)
Glucose-Capillary: 146 mg/dL — ABNORMAL HIGH (ref 65–99)
Glucose-Capillary: 99 mg/dL (ref 65–99)

## 2016-03-11 MED ORDER — BUPROPION HCL ER (XL) 150 MG PO TB24
150.0000 mg | ORAL_TABLET | Freq: Every day | ORAL | Status: DC
  Administered 2016-03-11 – 2016-03-16 (×6): 150 mg via ORAL
  Filled 2016-03-11 (×6): qty 1

## 2016-03-11 MED ORDER — MEROPENEM 1 G IV SOLR
1.0000 g | Freq: Three times a day (TID) | INTRAVENOUS | Status: DC
  Administered 2016-03-12 – 2016-03-15 (×11): 1 g via INTRAVENOUS
  Filled 2016-03-11 (×14): qty 1

## 2016-03-11 MED ORDER — CLONAZEPAM 0.5 MG PO TABS
0.5000 mg | ORAL_TABLET | Freq: Two times a day (BID) | ORAL | Status: DC
Start: 2016-03-11 — End: 2016-03-16
  Administered 2016-03-11 – 2016-03-16 (×11): 0.5 mg via ORAL
  Filled 2016-03-11 (×11): qty 1

## 2016-03-11 MED ORDER — FLUOXETINE HCL 20 MG PO CAPS
40.0000 mg | ORAL_CAPSULE | Freq: Every day | ORAL | Status: DC
  Administered 2016-03-11 – 2016-03-15 (×5): 40 mg via ORAL
  Filled 2016-03-11 (×5): qty 2

## 2016-03-11 MED ORDER — POTASSIUM CHLORIDE CRYS ER 20 MEQ PO TBCR
40.0000 meq | EXTENDED_RELEASE_TABLET | Freq: Once | ORAL | Status: AC
  Administered 2016-03-11: 40 meq via ORAL
  Filled 2016-03-11: qty 2

## 2016-03-11 MED ORDER — SODIUM CHLORIDE 0.9 % IV SOLN
INTRAVENOUS | Status: DC
  Administered 2016-03-12: 1000 mL via INTRAVENOUS
  Administered 2016-03-12: 22:00:00 via INTRAVENOUS
  Administered 2016-03-14: 1000 mL via INTRAVENOUS
  Administered 2016-03-15 – 2016-03-16 (×3): via INTRAVENOUS

## 2016-03-11 MED ORDER — VANCOMYCIN HCL IN DEXTROSE 750-5 MG/150ML-% IV SOLN
750.0000 mg | Freq: Two times a day (BID) | INTRAVENOUS | Status: DC
Start: 2016-03-11 — End: 2016-03-16
  Administered 2016-03-12 – 2016-03-15 (×8): 750 mg via INTRAVENOUS
  Filled 2016-03-11 (×12): qty 150

## 2016-03-11 NOTE — Telephone Encounter (Signed)
called daughter back inform will have to pick-up we do not have access to email out...Raechel Chute/lmb

## 2016-03-11 NOTE — Progress Notes (Addendum)
INFECTIOUS DISEASE PROGRESS NOTE  ID: Latasha Wang is a 75 y.o. female with  Principal Problem:   Acute encephalopathy Active Problems:   Diabetes ([place])   Anxiety state   Essential hypertension   GERD   Fever   Acute kidney injury ([facility])   Hyponatremia   Elevated lactic acid level   FUO (fever of unknown origin)  Subjective:  Tmax 102.8.  Worse confusion and agitation today.  Frequent urination.  No diarrhea.     Abtx:  Anti-infectives    Start     Dose/Rate Route Frequency Ordered Stop   03/10/16 1800  Ampicillin-Sulbactam (UNASYN) 3 g in sodium chloride 0.9 % 100 mL IVPB     3 g 100 mL/hr over 60 Minutes Intravenous Every 8 hours 03/10/16 1634     03/09/16 1000  vancomycin (VANCOCIN) IVPB 1000 mg/200 mL premix  Status:  Discontinued     1,000 mg 200 mL/hr over 60 Minutes Intravenous Every 24 hours 03/09/16 0923 03/10/16 1634   03/09/16 0930  piperacillin-tazobactam (ZOSYN) IVPB 3.375 g  Status:  Discontinued     3.375 g 12.5 mL/hr over 240 Minutes Intravenous Every 8 hours 03/09/16 0908 03/10/16 1634   03/08/16 1715  amoxicillin (AMOXIL) capsule 500 mg  Status:  Discontinued     500 mg Oral Every 8 hours 03/08/16 1712 03/09/16 0900      Medications: I have reviewed the patient's current medications.  Objective: Vital signs in last 24 hours: Temp:  [98.4 F (36.9 C)-102.8 F (39.3 C)] 99.7 F (37.6 C) (09/20 1300) Pulse Rate:  [55-72] 72 (09/20 1300) Resp:  [15-18] 18 (09/20 1300) BP: (86-148)/(51-98) 132/72 (09/20 1300) SpO2:  [91 %-99 %] 99 % (09/20 0851)   Patient is alert to voice, lying in bed, mild distress, fidgety  Oriented to person. Does not recognize son at bedside.  Moves all extremities. Town is [place], [place] is [place]. She cannot clearly articulate where she is.  PERRL, scleral anicteric  Mucous Membranes moist, intact sutures noted to lower gum line with minimal white drainage. No expressable purulence. Questionably tender.  ncek- upon  attempting to rotate her neck, resistance is met (that hurts my neck) RRR, no appreciable JVD, pulses 2+, no jugular cordis.  Lungs resonant throughout, even and non-labored Abd soft, +BS, nontender on exam,  No spinal or paraspinal tenderness on palpation, no CVA tenderness Skin warm, without rashes or edema  Lab Results  Recent Labs  03/10/16 0443 03/11/16 0630  WBC 8.3 8.0  HGB 10.1* 9.9*  HCT 32.3* 30.5*  NA 138 138  K 3.2* 3.0*  CL 106 108  CO2 23 22  BUN 9 <5*  CREATININE 1.02* 0.81   Liver Panel  Recent Labs  03/08/16 1222  PROT 7.3  ALBUMIN 3.8  AST 22  ALT 18  ALKPHOS 44  BILITOT 2.1*   Sedimentation Rate  Recent Labs  03/09/16 0639  ESRSEDRATE 35*   C-Reactive Protein No results for input(s): CRP in the last 72 hours.  Microbiology: Recent Results (from the past 240 hour(s))  Culture, blood (Routine x 2)     Status: None (Preliminary result)   Collection Time: 03/08/16 12:22 PM  Result Value Ref Range Status   Specimen Description BLOOD LEFT WRIST  Final   Special Requests BOTTLES DRAWN AEROBIC AND ANAEROBIC  5CC  Final   Culture NO GROWTH 3 DAYS  Final   Report Status PENDING  Incomplete  Culture, blood (Routine x 2)  Status: None (Preliminary result)   Collection Time: 03/08/16 12:25 PM  Result Value Ref Range Status   Specimen Description BLOOD RIGHT WRIST  Final   Special Requests IN PEDIATRIC BOTTLE  3CC  Final   Culture NO GROWTH 3 DAYS  Final   Report Status PENDING  Incomplete  Urine culture     Status: None   Collection Time: 03/08/16  2:52 PM  Result Value Ref Range Status   Specimen Description URINE, RANDOM  Final   Special Requests NONE  Final   Culture NO GROWTH  Final   Report Status 03/09/2016 FINAL  Final  Stool culture (children & immunocomp patients)     Status: None (Preliminary result)   Collection Time: 03/08/16  9:14 PM  Result Value Ref Range Status   Salmonella/Shigella Screen PENDING  Incomplete    Campylobacter Culture PENDING  Incomplete   E coli, Shiga toxin Assay Negative Negative Final    Comment: (NOTE) Performed At: [facility] [place], [place] [id number] Lindon Romp MD IW:[id number]   Culture, blood (routine x 2)     Status: None (Preliminary result)   Collection Time: 03/09/16  6:35 AM  Result Value Ref Range Status   Specimen Description BLOOD LEFT ARM  Final   Special Requests BOTTLES DRAWN AEROBIC AND ANAEROBIC 5CC  Final   Culture NO GROWTH 2 DAYS  Final   Report Status PENDING  Incomplete  Culture, blood (routine x 2)     Status: None (Preliminary result)   Collection Time: 03/09/16  6:54 AM  Result Value Ref Range Status   Specimen Description BLOOD LEFT HAND  Final   Special Requests BOTTLES DRAWN AEROBIC AND ANAEROBIC 5CC  Final   Culture NO GROWTH 2 DAYS  Final   Report Status PENDING  Incomplete    Studies/Results: Dg Chest 2 View  Result Date: 03/11/2016 CLINICAL DATA:  Cough.  Hypertension. EXAM: CHEST  2 VIEW COMPARISON:  March 09, 2016 FINDINGS: There is no edema or consolidation. Heart is mildly enlarged with pulmonary vascularity within normal limits. There is atherosclerotic calcification in the aorta. No adenopathy. There is evidence of old trauma involving the proximal left humerus, stable. Patient has had elbow replacement on the left. IMPRESSION: No edema or consolidation. Mild cardiomegaly. Aortic atherosclerosis. Electronically Signed   By: Lowella Grip III M.D.   On: 03/11/2016 10:24     Assessment/Plan: Total days of antibiotics: amoxicillin prior to admit and 9/17 ->9/18, vanc 9/18, zosyn 9/18    Impression/Recommendation FUO Sepsis Acute encephalopathy Possible Dental Space Infection DM HTN AKI  Will change her to vanco/merrem Will order MRI and please have neuro eval for persistent confusion   Check quant Gold Uric acid level 2.7  u/s of her neck pending to r/o septic jugular  thrombophlebitis  Home wellbutrin, prozac and klonopin restarted by primary team 9/20 If fever persists,  and other w/u negative, CT chest/abd/pelvis  Comment- Broad ddx- lemierre's, dental space infection, spread of infection from dental space?  BC x 2 9/17 ngtd BC x 2 9/18 ngtd UC negative Stool culture 9/17 >> ECHO 9/19 >> LVEF 60%, no RWA, grade 2 diastolic dysfunction, mild TR     Infectious Diseases (pager) [phone] [email or web address] 03/11/2016, 12:14 PM  LOS: 2 days

## 2016-03-11 NOTE — Evaluation (Signed)
Physical Therapy Evaluation Patient Details Name: Latasha Wang MRN: [id number] DOB: [birth year] Today's Date: 03/11/2016   History of Present Illness  Latasha Wang is an 75 y.o. female with a PMH of DM, HTN, fatty liver disease, HLD, GERD, anxiety, and depression admitted on 9/17 for fever with unknown source and acute encephalopathy  Clinical Impression  Patient presents with decreased independence with mobility due to deficits listed in PT problem list.  She will benefit from skilled PT in the acute setting to allow return home with family support following CIR level rehab stay.    Follow Up Recommendations CIR    Equipment Recommendations  Other (comment) (TBA)    Recommendations for Other Services       Precautions / Restrictions Precautions Precautions: Fall      Mobility  Bed Mobility Overal bed mobility: Needs Assistance Bed Mobility: Sit to Supine       Sit to supine: Mod assist   General bed mobility comments: assist for legs into bed  Transfers Overall transfer level: Needs assistance Equipment used: Rolling walker (2 wheeled);None Transfers: Sit to/from Stand Sit to Stand: Supervision;Min guard         General transfer comment: steadying assist, able to get up from [facility] unaided, but unsteady needing at least S for safety  Ambulation/Gait Ambulation/Gait assistance: Mod assist Ambulation Distance (Feet): 22 Feet Assistive device: Rolling walker (2 wheeled);None Gait Pattern/deviations: Step-to pattern;Wide base of support;Trunk flexed;Shuffle;Decreased stride length     General Gait Details: Initially with RW pt needed assist to push walker and to stay up with it with her feet, without walker short distance pt able to walk still with assist, but more fluid pattern  Stairs            Wheelchair Mobility    Modified Rankin (Stroke Patients Only)       Balance Overall balance assessment: Needs assistance           Standing  balance-Leahy Scale: Good Standing balance comment: feet wide in static stance needing at least minguard and pt reaching for items to hold onto                             Pertinent Vitals/Pain Pain Assessment: No/denies pain    Home Living Family/patient expects to be discharged to:: Private residence Living Arrangements: Children;Spouse/significant other Available Help at Discharge: Family Type of Home: House Home Access: Stairs to enter Entrance Stairs-Rails: Doctor, general practice of Steps: 4 Home Layout: Two level;Bed/bath upstairs;1/2 bath on main level        Prior Function Level of Independence: Independent               Hand Dominance   Dominant Hand: Right    Extremity/Trunk Assessment               Lower Extremity Assessment: Generalized weakness      Cervical / Trunk Assessment: Kyphotic  Communication   Communication: No difficulties  Cognition Arousal/Alertness: Awake/alert Behavior During Therapy: Impulsive Overall Cognitive Status: Impaired/Different from baseline Area of Impairment: Orientation;Memory;Following commands;Problem solving Orientation Level: Disoriented to;Place;Time;Situation     Following Commands: Follows one step commands inconsistently;Follows one step commands with increased time     Problem Solving: Slow processing;Decreased initiation;Difficulty sequencing;Requires tactile cues;Requires verbal cues      General Comments      Exercises     Assessment/Plan    PT Assessment Patient needs continued PT  services  PT Problem List Decreased strength;Decreased activity tolerance;Decreased balance;Decreased knowledge of use of DME;Decreased cognition;Decreased mobility;Decreased safety awareness          PT Treatment Interventions DME instruction;Gait training;Stair training;Therapeutic exercise;Therapeutic activities;Functional mobility training;Balance training;Patient/family education     PT Goals (Current goals can be found in the Care Plan section)  Acute Rehab PT Goals Patient Stated Goal: To return to independent PT Goal Formulation: With patient/family Time For Goal Achievement: 03/18/16 Potential to Achieve Goals: Good    Frequency Min 3X/week   Barriers to discharge        Co-evaluation               End of Session Equipment Utilized During Treatment: Gait belt Activity Tolerance: Patient limited by lethargy Patient left: in bed;with call bell/phone within reach;with bed alarm set;with family/visitor present;with nursing/sitter in room           Time: 1445-1510 PT Time Calculation (min) (ACUTE ONLY): 25 min   Charges:   PT Evaluation $PT Eval Moderate Complexity: 1 Procedure PT Treatments $Gait Training: 8-22 mins   PT G CodesElray Wang:        Latasha Wang 03/11/2016, 3:46 PM  Latasha Wang, PT [phone] 03/11/2016

## 2016-03-11 NOTE — Progress Notes (Signed)
*  Preliminary Results*  Bilateral internal jugular vein duplex completed. Bilateral internal jugular veins are patent with no evidence of thrombosis.  03/11/2016 2:23 PM Gertie FeyMichelle Vikkie Goeden, BS, RVT, RDCS, RDMS

## 2016-03-11 NOTE — Progress Notes (Signed)
PROGRESS NOTE    Latasha Wang  ZOX:[id number] DOB: 05/14/1941 DOA: 03/08/2016 PCP: Oliver BarreJames John, MD Brief Narrative:Latasha Wang is a pleasant 75 y.o. AF with medical history significant for diabetes, hypertension, fatty liver disease, depression, anxiety recent extraction of 14 teeth on 9/11 by Dr.Edward Lorin PicketScott presented to the emergency department with chief complaint of fever. Initial evaluation reveals a temperature of 103.2 orally, elevated lactic acid, hyponatremia, acute kidney injury and acute encephalopathy. Started on IV Vanc/Zosyn, Blood Cx pending, ID consulted, mentation improving  Assessment & Plan:   Sepsis/FUO -likely due to recent extraction of 14teeth, some with dental abscess prior to surgery  -Stopped on admission Amoxicillin, started IV Zosyn and Vancomycin on 9/18 -CT maxillofacial without abscess -ID consulted and Abx changed to IV Unasyn -mentation improving but continues to have high fevers -Blood cultures-NGTD -2D ECHO-unremarkable-lactate cleared -ID consulted, Dr.Hatcher following, fever curve improving  Toxic metabolic encephalopathy -due to above infection, improving with Abx -no meningeal signs, no neck stiffness -CT head unremarkable  -Mentation improved now -resume klonopin and Wellbutrin due to increased anxiety  DM -stable, SSI for now  HTN -stable, holding HCTZ/Amlodipine and ARB  Anxiety/Depression -resume klonopin, wellbutrin and   AKI -due to sepsis, dehydration with Diuretic and ARB use -resolved, continue IVF  Hyponatremia -due to dehydration/sepsis -improved, cut down IVF  DVT prophylaxis: Hep SQ Code Status:Full COde Family Communication:d//w son at bedside Disposition Plan:Home when improved   Antimicrobials:Amoxicliin 9/17-9/18  Vanc/Zosyn 9/18->   Subjective: More alert,  Fever curve much improved, no pain anywhere  Objective: Vitals:   03/11/16 0523 03/11/16 0851 03/11/16 1251 03/11/16 1300  BP: (!) 141/66 (!)  148/88  132/72  Pulse: (!) 57 72  72  Resp: 17 18  18   Temp: 99 F (37.2 C) 98.4 F (36.9 C) (!) 102.8 F (39.3 C) 99.7 F (37.6 C)  TempSrc:  Oral  Oral  SpO2: 97% 99%    Weight:      Height:        Intake/Output Summary (Last 24 hours) at 03/11/16 1534 Last data filed at 03/11/16 1330  Gross per 24 hour  Intake             2165 ml  Output             2575 ml  Net             -410 ml   Filed Weights   03/08/16 1207 03/09/16 0402  Weight: 77.1 kg (170 lb) 75.7 kg (166 lb 14.2 oz)    Examination:  General exam: more alert, answers questions, AAOx3 HEENT: Edentulous, swelling of gums noted, sutures noted Respiratory system: Clear to auscultation. Respiratory effort normal. Cardiovascular system: S1 & S2 heard, RRR. No JVD, murmurs, rubs, gallops or clicks. No pedal edema. Gastrointestinal system: Abdomen is nondistended, soft and nontender. No organomegaly or masses felt. Normal bowel sounds heard. Central nervous system:more alert, non focal Extremities: Symmetric 5 x 5 power. Skin: No rashes, lesions or ulcers Psychiatry: unable to assess    Data Reviewed: I have personally reviewed following labs and imaging studies  CBC:  Recent Labs Lab 03/08/16 1222 03/09/16 0639 03/10/16 0443 03/11/16 0630  WBC 9.1 9.9 8.3 8.0  NEUTROABS 6.5  --   --   --   HGB 11.2* 9.9* 10.1* 9.9*  HCT 35.0* 30.6* 32.3* 30.5*  MCV 90.0 88.7 89.5 88.9  PLT 196 175 180 182   Basic Metabolic Panel:  Recent Labs Lab 03/08/16  1222 03/09/16 0639 03/10/16 0443 03/11/16 0630  NA 131* 136 138 138  K 3.7 3.5 3.2* 3.0*  CL 100* 106 106 108  CO2 23 19* 23 22  GLUCOSE 115* 120* 121* 99  BUN 16 15 9  <5*  CREATININE 1.16* 1.47* 1.02* 0.81  CALCIUM 8.7* 8.2* 8.2* 8.2*   GFR: Estimated Creatinine Clearance (by C-G formula based on SCr of 0.81 mg/dL) Female: 16.1 mL/min Female: 63.5 mL/min Liver Function Tests:  Recent Labs Lab 03/08/16 1222  AST 22  ALT 18  ALKPHOS 44  BILITOT  2.1*  PROT 7.3  ALBUMIN 3.8   No results for input(s): LIPASE, AMYLASE in the last 168 hours. No results for input(s): AMMONIA in the last 168 hours. Coagulation Profile: No results for input(s): INR, PROTIME in the last 168 hours. Cardiac Enzymes: No results for input(s): CKTOTAL, CKMB, CKMBINDEX, TROPONINI in the last 168 hours. BNP (last 3 results) No results for input(s): PROBNP in the last 8760 hours. HbA1C:  Recent Labs  03/08/16 1732  HGBA1C 6.3*   CBG:  Recent Labs Lab 03/10/16 1213 03/10/16 1715 03/10/16 2116 03/11/16 0816 03/11/16 1151  GLUCAP 122* 91 104* 93 123*   Lipid Profile: No results for input(s): CHOL, HDL, LDLCALC, TRIG, CHOLHDL, LDLDIRECT in the last 72 hours. Thyroid Function Tests: No results for input(s): TSH, T4TOTAL, FREET4, T3FREE, THYROIDAB in the last 72 hours. Anemia Panel: No results for input(s): VITAMINB12, FOLATE, FERRITIN, TIBC, IRON, RETICCTPCT in the last 72 hours. Urine analysis:    Component Value Date/Time   COLORURINE YELLOW 03/09/2016 1525   APPEARANCEUR CLEAR 03/09/2016 1525   LABSPEC 1.024 03/09/2016 1525   PHURINE 5.5 03/09/2016 1525   GLUCOSEU NEGATIVE 03/09/2016 1525   GLUCOSEU NEGATIVE 12/11/2015 0838   HGBUR SMALL (A) 03/09/2016 1525   BILIRUBINUR NEGATIVE 03/09/2016 1525   KETONESUR 15 (A) 03/09/2016 1525   PROTEINUR NEGATIVE 03/09/2016 1525   UROBILINOGEN 0.2 12/11/2015 0838   NITRITE NEGATIVE 03/09/2016 1525   LEUKOCYTESUR SMALL (A) 03/09/2016 1525   Sepsis Labs: @LABRCNTIP (procalcitonin:4,lacticidven:4)  ) Recent Results (from the past 240 hour(s))  Culture, blood (Routine x 2)     Status: None (Preliminary result)   Collection Time: 03/08/16 12:22 PM  Result Value Ref Range Status   Specimen Description BLOOD LEFT WRIST  Final   Special Requests BOTTLES DRAWN AEROBIC AND ANAEROBIC  5CC  Final   Culture NO GROWTH 3 DAYS  Final   Report Status PENDING  Incomplete  Culture, blood (Routine x 2)      Status: None (Preliminary result)   Collection Time: 03/08/16 12:25 PM  Result Value Ref Range Status   Specimen Description BLOOD RIGHT WRIST  Final   Special Requests IN PEDIATRIC BOTTLE  3CC  Final   Culture NO GROWTH 3 DAYS  Final   Report Status PENDING  Incomplete  Urine culture     Status: None   Collection Time: 03/08/16  2:52 PM  Result Value Ref Range Status   Specimen Description URINE, RANDOM  Final   Special Requests NONE  Final   Culture NO GROWTH  Final   Report Status 03/09/2016 FINAL  Final  Stool culture (children & immunocomp patients)     Status: None (Preliminary result)   Collection Time: 03/08/16  9:14 PM  Result Value Ref Range Status   Salmonella/Shigella Screen PENDING  Incomplete   Campylobacter Culture PENDING  Incomplete   E coli, Shiga toxin Assay Negative Negative Final    Comment: (NOTE) Performed  At: [facility] [address] [place], [place] [id number] Mila Homer MD WU:[id number]   Culture, blood (routine x 2)     Status: None (Preliminary result)   Collection Time: 03/09/16  6:35 AM  Result Value Ref Range Status   Specimen Description BLOOD LEFT ARM  Final   Special Requests BOTTLES DRAWN AEROBIC AND ANAEROBIC 5CC  Final   Culture NO GROWTH 2 DAYS  Final   Report Status PENDING  Incomplete  Culture, blood (routine x 2)     Status: None (Preliminary result)   Collection Time: 03/09/16  6:54 AM  Result Value Ref Range Status   Specimen Description BLOOD LEFT HAND  Final   Special Requests BOTTLES DRAWN AEROBIC AND ANAEROBIC 5CC  Final   Culture NO GROWTH 2 DAYS  Final   Report Status PENDING  Incomplete         Radiology Studies: Dg Chest 2 View  Result Date: 03/11/2016 CLINICAL DATA:  Cough.  Hypertension. EXAM: CHEST  2 VIEW COMPARISON:  March 09, 2016 FINDINGS: There is no edema or consolidation. Heart is mildly enlarged with pulmonary vascularity within normal limits. There is atherosclerotic calcification in  the aorta. No adenopathy. There is evidence of old trauma involving the proximal left humerus, stable. Patient has had elbow replacement on the left. IMPRESSION: No edema or consolidation. Mild cardiomegaly. Aortic atherosclerosis. Electronically Signed   By: Bretta Bang III M.D.   On: 03/11/2016 10:24        Scheduled Meds: . ampicillin-sulbactam (UNASYN) IV  3 g Intravenous Q8H  . buPROPion  150 mg Oral Daily  . clonazePAM  0.5 mg Oral BID  . enoxaparin (LOVENOX) injection  40 mg Subcutaneous Q24H  . FLUoxetine  40 mg Oral QHS  . insulin aspart  0-5 Units Subcutaneous QHS  . insulin aspart  0-9 Units Subcutaneous TID WC  . potassium chloride  40 mEq Oral Once  . rosuvastatin  10 mg Oral Daily   Continuous Infusions: . sodium chloride       LOS: 2 days    Time spent:    Zannie Cove, MD [facility] Pager [phone]  If 7PM-7AM, please contact night-coverage [email or web address] Password [facility] 03/11/2016, 3:34 PM

## 2016-03-11 NOTE — Telephone Encounter (Signed)
Rec'd call from pt daughter requesting to have copy of mom medications list email to her @ [email or web address]. Inform pt will check with front office to see if they can email if not may have to pick up, but will call her back...Raechel Chute/lmb

## 2016-03-11 NOTE — Progress Notes (Signed)
Pharmacy Antibiotic Note  Latasha Wang is a 75 y.o. female admitted on 03/08/2016 with rule out meningitis - patient has fever of unknown origin and possible dental space infection.  Pharmacy has been consulted for restart of Vancomycin and initiation of Meropenem dosing.  Patient is agitated and confused with neck pain.  WBC is within normal limits. Tmax 101.4.  SCr is improving - currently 0.81 with estimated CrCl ~9150mL/min.  Last dose of Vancomycin was at 0959 on 9/19. Last dose of Unasyn was 10:49 today, 9/20.   Plan: Vancomycin 750mg  IV every 12 hours.  Goal trough 15-20 mcg/mL. Meropenem 1g IV every 8 hours.   Monitor renal function, clinical status, and culture results.     Height: 4\' 9"  (144.8 cm) Weight: 166 lb 14.2 oz (75.7 kg) IBW/kg (Calculated) : 38.6  Temp (24hrs), Avg:100 F (37.8 C), Min:98.4 F (36.9 C), Max:102.8 F (39.3 C)   Recent Labs Lab 03/08/16 1222 03/08/16 1256 03/08/16 1602 03/08/16 1720 03/09/16 0639 03/10/16 0443 03/11/16 0630  WBC 9.1  --   --   --  9.9 8.3 8.0  CREATININE 1.16*  --   --   --  1.47* 1.02* 0.81  LATICACIDVEN  --  2.02* 1.00 1.5  --   --   --     Estimated Creatinine Clearance (by C-G formula based on SCr of 0.81 mg/dL) Female: 16.151.4 mL/min Female: 63.5 mL/min    No Known Allergies  Antimicrobials this admission: Prior to admission- Amoxicillin 9/17 - 9/18 9/18 Vanc >>9/19; 9/20 >> 9/18 Zosyn >> 9/19 9/19 Unasyn >> 9/20 9/20 Meropenem >>  Dose adjustments this admission: na  Microbiology results: 9/17 BCx: ngtd x3 days 9/17 UCx: negative 9/17 Stool culture: 9/18 BCx: ngtd x2 days  Thank you for allowing pharmacy to be a part of this patient's care.  Link SnufferJessica Camden Mazzaferro, PharmD, BCPS Clinical Pharmacist [phone] 03/11/2016 4:31 PM

## 2016-03-11 NOTE — Progress Notes (Signed)
Rehab Admissions Coordinator Note:  Patient was screened by Trish MageLogue, Quintana Canelo M for appropriateness for an Inpatient Acute Rehab Consult.  Noted PT recommending CIR.  At this time, we are recommending Inpatient Rehab consult.  Note we will need an order for an OT evaluation as well if MD wants to pursue inpatient rehab admission.  Trish MageLogue, Dorin Stooksbury M 03/11/2016, 5:19 PM  I can be reached at [phone].

## 2016-03-11 NOTE — Progress Notes (Addendum)
Pt had artifact in cardiac rhythm, possible short runs of non sustained V Tach. MD notified at [phone]. Doctor Jomarie LongsJoseph called back, it will continue to monitor.

## 2016-03-12 ENCOUNTER — Inpatient Hospital Stay (HOSPITAL_COMMUNITY): Payer: Medicare Other

## 2016-03-12 DIAGNOSIS — E1122 Type 2 diabetes mellitus with diabetic chronic kidney disease: Secondary | ICD-10-CM

## 2016-03-12 DIAGNOSIS — K0889 Other specified disorders of teeth and supporting structures: Secondary | ICD-10-CM

## 2016-03-12 DIAGNOSIS — E118 Type 2 diabetes mellitus with unspecified complications: Secondary | ICD-10-CM

## 2016-03-12 LAB — CBC
HCT: 29.8 % — ABNORMAL LOW (ref 36.0–46.0)
Hemoglobin: 9.5 g/dL — ABNORMAL LOW (ref 12.0–15.0)
MCH: 28.4 pg (ref 26.0–34.0)
MCHC: 31.9 g/dL (ref 30.0–36.0)
MCV: 89 fL (ref 78.0–100.0)
PLATELETS: 161 10*3/uL (ref 150–400)
RBC: 3.35 MIL/uL — ABNORMAL LOW (ref 3.87–5.11)
RDW: 14.8 % (ref 11.5–15.5)
WBC: 7.5 10*3/uL (ref 4.0–10.5)

## 2016-03-12 LAB — BASIC METABOLIC PANEL
Anion gap: 10 (ref 5–15)
BUN: 6 mg/dL (ref 6–20)
CALCIUM: 8 mg/dL — AB (ref 8.9–10.3)
CO2: 22 mmol/L (ref 22–32)
Chloride: 107 mmol/L (ref 101–111)
Creatinine, Ser: 0.86 mg/dL (ref 0.44–1.00)
GFR calc Af Amer: >60 mL/min (ref >60)
GLUCOSE: 109 mg/dL — AB (ref 65–99)
Potassium: 2.8 mmol/L — ABNORMAL LOW (ref 3.5–5.1)
SODIUM: 139 mmol/L (ref 135–145)

## 2016-03-12 LAB — POTASSIUM
POTASSIUM: 3.2 mmol/L — AB (ref 3.5–5.1)
Potassium: 2.8 mmol/L — ABNORMAL LOW (ref 3.5–5.1)

## 2016-03-12 LAB — GLUCOSE, CAPILLARY
GLUCOSE-CAPILLARY: 121 mg/dL — AB (ref 65–99)
Glucose-Capillary: 122 mg/dL — ABNORMAL HIGH (ref 65–99)
Glucose-Capillary: 123 mg/dL — ABNORMAL HIGH (ref 65–99)
Glucose-Capillary: 126 mg/dL — ABNORMAL HIGH (ref 65–99)

## 2016-03-12 LAB — MAGNESIUM: MAGNESIUM: 1.9 mg/dL (ref 1.7–2.4)

## 2016-03-12 MED ORDER — GADOBENATE DIMEGLUMINE 529 MG/ML IV SOLN
15.0000 mL | Freq: Once | INTRAVENOUS | Status: AC
  Administered 2016-03-12: 15 mL via INTRAVENOUS

## 2016-03-12 MED ORDER — MAGNESIUM SULFATE 2 GM/50ML IV SOLN
2.0000 g | Freq: Once | INTRAVENOUS | Status: AC
  Administered 2016-03-13: 2 g via INTRAVENOUS
  Filled 2016-03-12: qty 50

## 2016-03-12 MED ORDER — POTASSIUM CHLORIDE CRYS ER 20 MEQ PO TBCR
50.0000 meq | EXTENDED_RELEASE_TABLET | Freq: Once | ORAL | Status: AC
  Administered 2016-03-12: 50 meq via ORAL
  Filled 2016-03-12: qty 1

## 2016-03-12 MED ORDER — POTASSIUM CHLORIDE 10 MEQ/100ML IV SOLN: 10.0000 meq | INTRAVENOUS | Status: DC

## 2016-03-12 MED ORDER — POTASSIUM CHLORIDE 10 MEQ/100ML IV SOLN
10.0000 meq | INTRAVENOUS | Status: AC
  Filled 2016-03-12 (×2): qty 100

## 2016-03-12 NOTE — [place] FL2 (Signed)
[place] MEDICAID FL2 LEVEL OF CARE SCREENING TOOL     IDENTIFICATION  Patient Name: Latasha Wang Birthdate: 10/22/1940 Sex: female Admission Date (Current Location): 03/08/2016  [facility] [place]   Facility and Address:  [facility], [address], [place], [place] [address]      Provider Number: [id number]  Attending Physician Name and Address:  Drema Dallasurtis J Woods, MD  Relative Name and Phone Number:       Current Level of Care: Hospital Recommended Level of Care: Skilled Nursing Facility Prior Approval Number:    Date Approved/Denied:   PASRR Number:    Discharge Plan: SNF    Current Diagnoses: Patient Active Problem List   Diagnosis Date Noted  . Fever 03/08/2016  . Acute kidney injury (HCC) 03/08/2016  . Hyponatremia 03/08/2016  . Acute encephalopathy 03/08/2016  . Elevated lactic acid level 03/08/2016  . FUO (fever of unknown origin)   . Memory dysfunction 06/13/2013  . Humerus distal fracture, left 02/19/2012  . Encounter for preventative adult health care exam with abnormal findings 12/10/2010  . LATERAL EPICONDYLITIS, RIGHT 12/17/2008  . Anxiety state 06/20/2008  . Gastroparesis 09/01/2007  . FATTY LIVER DISEASE 09/01/2007  . VERTIGO, CHRONIC 09/01/2007  . LIVER FUNCTION TESTS, ABNORMAL 07/19/2007  . Hyperlipidemia 07/05/2007  . Other malaise and fatigue 07/05/2007  . Diabetes (HCC) 05/09/2007  . Depression 05/09/2007  . Essential hypertension 05/09/2007  . GERD 05/09/2007  . MYALGIA 05/09/2007    Orientation RESPIRATION BLADDER Height & Weight     Self, Time, Situation, Place  Normal Continent Weight: 166 lb 14.2 oz (75.7 kg) Height:  4\' 9"  (144.8 cm)  BEHAVIORAL SYMPTOMS/MOOD NEUROLOGICAL BOWEL NUTRITION STATUS      Continent Diet (mechanical soft)  AMBULATORY STATUS COMMUNICATION OF NEEDS Skin   Limited Assist Verbally Normal                       Personal Care Assistance Level of  Assistance  Bathing, Dressing Bathing Assistance: Limited assistance   Dressing Assistance: Limited assistance     Functional Limitations Info             SPECIAL CARE FACTORS FREQUENCY  PT (By licensed PT), OT (By licensed OT)     PT Frequency: 5/wk OT Frequency: 5/wk            Contractures      Additional Factors Info  Code Status, Allergies, Psychotropic, Insulin Sliding Scale Code Status Info: FULL Allergies Info: NKA Psychotropic Info: wellbutrin, klonopin, prozac Insulin Sliding Scale Info: 4/wk       Current Medications (03/12/2016):  This is the current hospital active medication list Current Facility-Administered Medications  Medication Dose Route Frequency Provider Last Rate Last Dose  . 0.9 %  sodium chloride infusion   Intravenous Continuous Zannie CovePreetha Joseph, MD 75 mL/hr at 03/12/16 0617 1,000 mL at 03/12/16 0617  . acetaminophen (TYLENOL) tablet 650 mg  650 mg Oral Q6H PRN Gwenyth BenderKaren M Black, NP   650 mg at 03/11/16 2229   Or  . acetaminophen (TYLENOL) suppository 650 mg  650 mg Rectal Q6H PRN Gwenyth BenderKaren M Black, NP      . albuterol (PROVENTIL) (2.5 MG/3ML) 0.083% nebulizer solution 2.5 mg  2.5 mg Nebulization Q6H PRN Drema Dallasurtis J Woods, MD      . buPROPion (WELLBUTRIN XL) 24 hr tablet 150 mg  150 mg Oral Daily Zannie CovePreetha Joseph, MD   150 mg at 03/12/16  0981  . clonazePAM (KLONOPIN) tablet 0.5 mg  0.5 mg Oral BID Zannie Cove, MD   0.5 mg at 03/12/16 0856  . enoxaparin (LOVENOX) injection 40 mg  40 mg Subcutaneous Q24H Zannie Cove, MD   40 mg at 03/11/16 2229  . FLUoxetine (PROZAC) capsule 40 mg  40 mg Oral QHS Zannie Cove, MD   40 mg at 03/11/16 2229  . ibuprofen (ADVIL,MOTRIN) tablet 600 mg  600 mg Oral Q6H PRN Zannie Cove, MD   600 mg at 03/10/16 2329  . insulin aspart (novoLOG) injection 0-5 Units  0-5 Units Subcutaneous QHS Lesle Chris Black, NP      . insulin aspart (novoLOG) injection 0-9 Units  0-9 Units Subcutaneous TID WC Gwenyth Bender, NP   1 Units at  03/12/16 0900  . meropenem (MERREM) 1 g in sodium chloride 0.9 % 100 mL IVPB  1 g Intravenous Q8H Ginnie Smart, MD   1 g at 03/12/16 0857  . ondansetron (ZOFRAN) tablet 4 mg  4 mg Oral Q6H PRN Gwenyth Bender, NP       Or  . ondansetron [facility]) injection 4 mg  4 mg Intravenous Q6H PRN Lesle Chris Black, NP      . polyethylene glycol (MIRALAX / GLYCOLAX) packet 17 g  17 g Oral Daily PRN Lesle Chris Black, NP      . potassium chloride (K-DUR,KLOR-CON) CR tablet 50 mEq  50 mEq Oral Once Drema Dallas, MD      . rosuvastatin (CRESTOR) tablet 10 mg  10 mg Oral Daily Gwenyth Bender, NP   10 mg at 03/12/16 0856  . vancomycin (VANCOCIN) IVPB 750 mg/150 ml premix  750 mg Intravenous Q12H Ginnie Smart, MD   750 mg at 03/12/16 0620     Discharge Medications: Please see discharge summary for a list of discharge medications.  Relevant Imaging Results:  Relevant Lab Results:   Additional Information SS#: [id number]  Burna Sis, LCSW

## 2016-03-12 NOTE — Clinical Social Work Note (Signed)
Clinical Social Work Assessment  Patient Details  Name: Latasha Wang MRN: [id number] Date of Birth: 10/19/1940  Date of referral:  03/12/16               Reason for consult:  Facility Placement                Permission sought to share information with:  Facility Medical sales representativeContact Representative, Family Supports Permission granted to share information::  Yes, Verbal Permission Granted  Name::        Agency::  SNFs  Relationship::  ex-spouse  Contact Information:     Housing/Transportation Living arrangements for the past 2 months:  Single Family Home Source of Information:  Patient, Other (Comment Required) (ex-spouse) Patient Interpreter Needed:  None Criminal Activity/Legal Involvement Pertinent to Current Situation/Hospitalization:  No - Comment as needed Significant Relationships:  Adult Children Lives with:  Adult Children Do you feel safe going back to the place where you live?  Yes Need for family participation in patient care:  Yes (Comment) (dtr helps with care and decision making )  Care giving concerns:  Pt lives at home with dtr- dtr is primary caregiver and pt is unsure if she will be able to manage needs at home.   Social Worker assessment / plan:  CSW spoke with pt and pt ex-spouse at bedside concerning SNF recommendation.  Pt responsive but lethargic during interview and pt ex-spouse provided most of the answers.  States that pt is cared for by her dtr, Bonita QuinLinda, at home and suggest CSW speak with her to confirm plan but pt and pt ex-spouse agreeable to SNF stay if needed.    CSW attempted to contact [place] multiple times with no response left card at bedside for her to follow up when she arrives.  Employment status:  Retired Database administratornsurance information:  Managed Medicare PT Recommendations:  Inpatient Rehab Consult Information / Referral to community resources:  Skilled Nursing Facility  Patient/Family's Response to care:  Pt agreeable to plan pending dtrs  response  Patient/Family's Understanding of and Emotional Response to Diagnosis, Current Treatment, and Prognosis:  No questions or concerns at this time hopeful to have full recovery.  Emotional Assessment Appearance:  Appears stated age Attitude/Demeanor/Rapport:  Sedated Affect (typically observed):  Appropriate, Quiet Orientation:  Oriented to Self, Oriented to Place, Oriented to  Time, Oriented to Situation Alcohol / Substance use:  Not Applicable Psych involvement (Current and /or in the community):     Discharge Needs  Concerns to be addressed:  Care Coordination Readmission within the last 30 days:  No Current discharge risk:  Physical Impairment Barriers to Discharge:  Continued Medical Work up   Burna SisUris, Camey Edell H, LCSW 03/12/2016, 4:52 PM

## 2016-03-12 NOTE — Progress Notes (Signed)
PROGRESS NOTE    Latasha Wang  RUE:[id number] DOB: 1940-10-19 DOA: 03/08/2016 PCP: Oliver Barre, MD     Brief Narrative:  75 y.o. AF with medical history significant for diabetes, hypertension, fatty liver disease, depression, anxiety recent extraction of 14 teeth presents to the emergency department with chief complaint of fever. Initial evaluation reveals a temperature of 103.2 orally, elevated lactic acid, hyponatremia, acute kidney injury and acute encephalopathy.  Information is obtained from the patient and the daughter who is at the bedside. 2 days ago she had 14 teeth extracted anticipation of dentures. Daughter states done yesterday reported that "a couple of teeth had decay in a couple had abscesses". She was sent home with prednisone taper which is completed and amoxicillin. Over the last 2 days she became gradually more lethargic and fevers. This morning patient was unable to bear weight became incontinent of urine. She is symptoms include decreased oral intake over the last 2 days complaints of low back pain. He has not had any pain medicine for 2 days. She has been taken her antibiotics. No complaints of headache dizziness syncope or near-syncope. No complaints of chest pain palpitation shortness of breath. No cough no dysuria hematuria frequency or urgency. No diarrhea abdominal pain nausea vomiting.    Subjective: 9/21 A/O 4, sleepy but arousable. Follows all commands. MAXIMUM TEMPERATURE last 24 hours 39.4C   Assessment & Plan:   Principal Problem:   Acute encephalopathy Active Problems:   Diabetes (HCC)   Anxiety state   Essential hypertension   GERD   Fever   Acute kidney injury (HCC)   Hyponatremia   Elevated lactic acid level   FUO (fever of unknown origin)   Controlled diabetes mellitus type 2 with complications (HCC)   Acute encephalopathy -Improved: However son states waxes and wanes initially did not recognize him earlier this morning.  -Manage  fever -Obtain B-12 and folate -Normal saline 38ml/hr  FUO  - Etiology uncertain (does not meet sepsis criteria). Urinalysis and chest x-ray unremarkable. -She did have 14 teeth extracted 2 days ago. Reportedly due to abscesses and decay. She's been on amoxicillin ever since. Lactic acid mildly elevated. She is hemodynamically stable nontoxic appearing -CT maxillofacial rule out abscess: Negative abscess see results below - Per ID: Awaiting U/S of her neck to r/o septic jugular thrombophlebitis, Await quant Gold If fever persists,  and other w/u negative, CT chest/abd/pelvis  Acute kidney injury. -Resolved   Hyponatremia.  -Resolved   Hypertension.  -Blood pressure slightly soft in the emergency department.  -Home medications include amlodipine, hydrochlorothiazide,irbesartan: hold home meds for now  Diabetes Type 2 controlled with complication.  -Patient is on oral agents.  -9/17 Hemoglobin A1c= 6.3 -Sensitive SSI  Anxiety/Depression.  -Wellbutrin 150 mg daily -Clonazepam 0.5 mg BID -Prozac 40 mg daily   Status post teeth extraction.  -Dr. Kristin Bruins consulted appears on 9/20. Awaiting recommendations   Hypokalemia -K-Dur 50 mEq -Recheck K/Mg at 1400 -After repeat K still low: Potassium IV 50 mEq  Hypomagnesemia -Magnesium IV 2 g   DVT prophylaxis: Lovenox Code Status: Full Family Communication: Spoke with son Disposition Plan: Await recommendations from ID and Dr. Kristin Bruins   Consultants:  [facility] ID    Procedures/Significant Events:  9/17 CT maxillofacial W contrast: Negative abscess 9/18 CT head: Negative abnormality 9/21 MRI brain; negative acute infarct    Cultures 9/17 blood left/right wrist NGTD 9/17 urine negative 9/17 stool in NGTD 9/18 blood left arm/hand NGTD   Antimicrobials: Amoxicillin  9/17>> 9/18 (note was on amoxicillin as outpatient also) Unasyn 9/19>> 9/20 Zosyn 9/18>> 9/19 Meropenem 9/20>> Vancomycin  9/18>>    Devices None   LINES / TUBES:  None    Continuous Infusions: . sodium chloride 1,000 mL (03/12/16 0617)     Objective: Vitals:   03/11/16 1300 03/11/16 2203 03/12/16 0452 03/12/16 1417  BP: 132/72 118/70 (!) 124/54 126/60  Pulse: 72 67 (!) 55 64  Resp: 18  18 19   Temp: 99.7 F (37.6 C) (!) 103 F (39.4 C) 98.7 F (37.1 C) 99.6 F (37.6 C)  TempSrc: Oral     SpO2:  98% 98% 100%  Weight:      Height:        Intake/Output Summary (Last 24 hours) at 03/12/16 2036 Last data filed at 03/12/16 1500  Gross per 24 hour  Intake                0 ml  Output             1825 ml  Net            -1825 ml   Filed Weights   03/08/16 1207 03/09/16 0402  Weight: 77.1 kg (170 lb) 75.7 kg (166 lb 14.2 oz)    Examination:  General: Sleepy but arousable, A/O 4, NAD, No acute respiratory distress Eyes: negative scleral hemorrhage, negative anisocoria, negative icterus ENT: Negative Runny nose, negative gingival bleeding, Neck:  Negative scars, masses, torticollis, lymphadenopathy, JVD Lungs: Clear to auscultation bilaterally without wheezes or crackles Cardiovascular: Regular rate and rhythm without murmur gallop or rub normal S1 and S2 Abdomen: negative abdominal pain, nondistended, positive soft, bowel sounds, no rebound, no ascites, no appreciable mass Extremities: No significant cyanosis, clubbing, or edema bilateral lower extremities Skin: Negative rashes, lesions, ulcers Psychiatric:  Negative depression, negative anxiety, negative fatigue, negative mania  Central nervous system:  Cranial nerves II through XII intact, tongue/uvula midline, all extremities muscle strength 5/5, sensation intact throughout,negative dysarthria, negative expressive aphasia, negative receptive aphasia.  .     Data Reviewed: Care during the described time interval was provided by me .  I have reviewed this patient's available data, including medical history, events of note, physical  examination, and all test results as part of my evaluation. I have personally reviewed and interpreted all radiology studies.  CBC:  Recent Labs Lab 03/08/16 1222 03/09/16 0639 03/10/16 0443 03/11/16 0630 03/12/16 0515  WBC 9.1 9.9 8.3 8.0 7.5  NEUTROABS 6.5  --   --   --   --   HGB 11.2* 9.9* 10.1* 9.9* 9.5*  HCT 35.0* 30.6* 32.3* 30.5* 29.8*  MCV 90.0 88.7 89.5 88.9 89.0  PLT 196 175 180 182 161   Basic Metabolic Panel:  Recent Labs Lab 03/08/16 1222 03/09/16 0639 03/10/16 0443 03/11/16 0630 03/12/16 0515 03/12/16 1427  NA 131* 136 138 138 139  --   K 3.7 3.5 3.2* 3.0* 2.8* 2.8*  CL 100* 106 106 108 107  --   CO2 23 19* 23 22 22   --   GLUCOSE 115* 120* 121* 99 109*  --   BUN 16 15 9  <5* 6  --   CREATININE 1.16* 1.47* 1.02* 0.81 0.86  --   CALCIUM 8.7* 8.2* 8.2* 8.2* 8.0*  --   MG  --   --   --   --   --  1.9   GFR: Estimated Creatinine Clearance (by C-G formula based on SCr of 0.86 mg/dL)  Female: 48.4 mL/min Female: 59.8 mL/min Liver Function Tests:  Recent Labs Lab 03/08/16 1222  AST 22  ALT 18  ALKPHOS 44  BILITOT 2.1*  PROT 7.3  ALBUMIN 3.8   No results for input(s): LIPASE, AMYLASE in the last 168 hours. No results for input(s): AMMONIA in the last 168 hours. Coagulation Profile: No results for input(s): INR, PROTIME in the last 168 hours. Cardiac Enzymes: No results for input(s): CKTOTAL, CKMB, CKMBINDEX, TROPONINI in the last 168 hours. BNP (last 3 results) No results for input(s): PROBNP in the last 8760 hours. HbA1C: No results for input(s): HGBA1C in the last 72 hours. CBG:  Recent Labs Lab 03/11/16 1717 03/11/16 2204 03/12/16 0804 03/12/16 1157 03/12/16 1805  GLUCAP 146* 99 121* 122* 123*   Lipid Profile: No results for input(s): CHOL, HDL, LDLCALC, TRIG, CHOLHDL, LDLDIRECT in the last 72 hours. Thyroid Function Tests: No results for input(s): TSH, T4TOTAL, FREET4, T3FREE, THYROIDAB in the last 72 hours. Anemia Panel: No  results for input(s): VITAMINB12, FOLATE, FERRITIN, TIBC, IRON, RETICCTPCT in the last 72 hours. Sepsis Labs:  Recent Labs Lab 03/08/16 1256 03/08/16 1602 03/08/16 1720  LATICACIDVEN 2.02* 1.00 1.5    Recent Results (from the past 240 hour(s))  Culture, blood (Routine x 2)     Status: None (Preliminary result)   Collection Time: 03/08/16 12:22 PM  Result Value Ref Range Status   Specimen Description BLOOD LEFT WRIST  Final   Special Requests BOTTLES DRAWN AEROBIC AND ANAEROBIC  5CC  Final   Culture NO GROWTH 4 DAYS  Final   Report Status PENDING  Incomplete  Culture, blood (Routine x 2)     Status: None (Preliminary result)   Collection Time: 03/08/16 12:25 PM  Result Value Ref Range Status   Specimen Description BLOOD RIGHT WRIST  Final   Special Requests IN PEDIATRIC BOTTLE  3CC  Final   Culture NO GROWTH 4 DAYS  Final   Report Status PENDING  Incomplete  Urine culture     Status: None   Collection Time: 03/08/16  2:52 PM  Result Value Ref Range Status   Specimen Description URINE, RANDOM  Final   Special Requests NONE  Final   Culture NO GROWTH  Final   Report Status 03/09/2016 FINAL  Final  Stool culture (children & immunocomp patients)     Status: None (Preliminary result)   Collection Time: 03/08/16  9:14 PM  Result Value Ref Range Status   Salmonella/Shigella Screen Final report  Final    Comment: (NOTE) Performed At: [facility] [address] [place], [place] [id number] Mila HomerHancock William F MD WU:[id number]:[phone]    Campylobacter Culture PENDING  Incomplete   E coli, Shiga toxin Assay Negative Negative Final    Comment: (NOTE) Performed At: [facility] [place] [address] [place], [place] [id number] Mila HomerHancock William F MD VH:[id number]:[phone]   STOOL CULTURE REFLEX - RSASHR     Status: None   Collection Time: 03/08/16  9:14 PM  Result Value Ref Range Status   Stool Culture result 1 (RSASHR) Comment  Final    Comment: (NOTE) No Salmonella or Shigella  recovered. Performed At: [facility] [place] [address] [place], [place] [id number] Mila HomerHancock William F MD [id number]:[phone]   Culture, blood (routine x 2)     Status: None (Preliminary result)   Collection Time: 03/09/16  6:35 AM  Result Value Ref Range Status   Specimen Description BLOOD LEFT ARM  Final   Special Requests BOTTLES DRAWN AEROBIC AND  ANAEROBIC 5CC  Final   Culture NO GROWTH 3 DAYS  Final   Report Status PENDING  Incomplete  Culture, blood (routine x 2)     Status: None (Preliminary result)   Collection Time: 03/09/16  6:54 AM  Result Value Ref Range Status   Specimen Description BLOOD LEFT HAND  Final   Special Requests BOTTLES DRAWN AEROBIC AND ANAEROBIC 5CC  Final   Culture NO GROWTH 3 DAYS  Final   Report Status PENDING  Incomplete         Radiology Studies: Dg Chest 2 View  Result Date: 03/11/2016 CLINICAL DATA:  Cough.  Hypertension. EXAM: CHEST  2 VIEW COMPARISON:  March 09, 2016 FINDINGS: There is no edema or consolidation. Heart is mildly enlarged with pulmonary vascularity within normal limits. There is atherosclerotic calcification in the aorta. No adenopathy. There is evidence of old trauma involving the proximal left humerus, stable. Patient has had elbow replacement on the left. IMPRESSION: No edema or consolidation. Mild cardiomegaly. Aortic atherosclerosis. Electronically Signed   By: Bretta Bang III M.D.   On: 03/11/2016 10:24   Mr Laqueta Jean FA Contrast  Result Date: 03/12/2016 CLINICAL DATA:  75 y.o. female admitted on 03/08/2016 with rule out meningitis - patient has fever of unknown origin and possible dental space infection. Patient is agitated and confused with neck pain EXAM: MRI HEAD WITHOUT AND WITH CONTRAST TECHNIQUE: Multiplanar, multiecho pulse sequences of the brain and surrounding structures were obtained without and with intravenous contrast. CONTRAST:  15mL MULTIHANCE GADOBENATE DIMEGLUMINE 529 MG/ML IV SOLN COMPARISON:  CT head  03/09/2016. FINDINGS: The patient was unable to remain motionless for the exam. Small or subtle lesions could be overlooked. Brain: No evidence for acute infarction, hemorrhage, mass lesion, hydrocephalus, or extra-axial fluid. Mild atrophy. Minor white matter disease, nonspecific. Post infusion, no abnormal enhancement of the brain or meninges. Vascular: Normal flow voids. Skull and upper cervical spine: Partial empty sella. No midline abnormality. Sinuses/Orbits: No orbital masses or proptosis. Globes appear symmetric. Sinuses appear well aerated, without evidence for air-fluid level. Other: No nasopharyngeal pathology or mastoid fluid. Scalp and other visualized extracranial soft tissues grossly unremarkable. IMPRESSION: Motion degraded examination demonstrating atrophy without acute intracranial findings. No abnormal postcontrast enhancement or restricted diffusion to suggest intracranial infection/inflammatory process. No significant sinus disease or parameningeal focus of infection. Electronically Signed   By: Elsie Stain M.D.   On: 03/12/2016 17:59        Scheduled Meds: . buPROPion  150 mg Oral Daily  . clonazePAM  0.5 mg Oral BID  . enoxaparin (LOVENOX) injection  40 mg Subcutaneous Q24H  . FLUoxetine  40 mg Oral QHS  . insulin aspart  0-5 Units Subcutaneous QHS  . insulin aspart  0-9 Units Subcutaneous TID WC  . magnesium sulfate 1 - 4 g bolus IVPB  2 g Intravenous Once  . meropenem (MERREM) IV  1 g Intravenous Q8H  . potassium chloride  10 mEq Intravenous Q1 Hr x 5  . rosuvastatin  10 mg Oral Daily  . vancomycin  750 mg Intravenous Q12H   Continuous Infusions: . sodium chloride 1,000 mL (03/12/16 0617)     LOS: 3 days    Time spent:40 min    WOODS, Roselind Messier, MD [facility] Pager [phone]  If 7PM-7AM, please contact night-coverage [email or web address] Password [facility] 03/12/2016, 8:36 PM

## 2016-03-12 NOTE — Progress Notes (Signed)
Patients son requested all 4 siderails be up for patient, who is a high fall risk. At this time all 4 are up and bed alarm is on.  Safety maintained.

## 2016-03-12 NOTE — Progress Notes (Signed)
INFECTIOUS DISEASE PROGRESS NOTE  ID: Latasha Wang is a 75 y.o. female with  Principal Problem:   Acute encephalopathy Active Problems:   Diabetes ([place])   Anxiety state   Essential hypertension   GERD   Fever   Acute kidney injury (HCC)   Hyponatremia   Elevated lactic acid level   FUO (fever of unknown origin)  Subjective:  Tmax 103. Agitation and confusion improved since yesterday.         Abtx:  Anti-infectives    Start     Dose/Rate Route Frequency Ordered Stop   03/11/16 1715  vancomycin (VANCOCIN) IVPB 750 mg/150 ml premix     750 mg 150 mL/hr over 60 Minutes Intravenous Every 12 hours 03/11/16 1703     03/11/16 1715  meropenem (MERREM) 1 g in sodium chloride 0.9 % 100 mL IVPB     1 g 200 mL/hr over 30 Minutes Intravenous Every 8 hours 03/11/16 1703     03/10/16 1800  Ampicillin-Sulbactam (UNASYN) 3 g in sodium chloride 0.9 % 100 mL IVPB  Status:  Discontinued     3 g 100 mL/hr over 60 Minutes Intravenous Every 8 hours 03/10/16 1634 03/11/16 1702   03/09/16 1000  vancomycin (VANCOCIN) IVPB 1000 mg/200 mL premix  Status:  Discontinued     1,000 mg 200 mL/hr over 60 Minutes Intravenous Every 24 hours 03/09/16 0923 03/10/16 1634   03/09/16 0930  piperacillin-tazobactam (ZOSYN) IVPB 3.375 g  Status:  Discontinued     3.375 g 12.5 mL/hr over 240 Minutes Intravenous Every 8 hours 03/09/16 0908 03/10/16 1634   03/08/16 1715  amoxicillin (AMOXIL) capsule 500 mg  Status:  Discontinued     500 mg Oral Every 8 hours 03/08/16 1712 03/09/16 0900      Medications: I have reviewed the patient's current medications.  Objective: Vital signs in last 24 hours: Temp:  [98.4 F (36.9 C)-103 F (39.4 C)] 98.7 F (37.1 C) (09/21 0452) Pulse Rate:  [55-72] 55 (09/21 0452) Resp:  [18] 18 (09/21 0452) BP: (118-148)/(54-88) 124/54 (09/21 0452) SpO2:  [98 %-99 %] 98 % (09/21 0452)   Patient is alert to voice, lying in bed, no distress Oriented to person, place, month,  hospital.  Moves all extremities.  PERRL, scleral anicteric  Mucous Membranes moist, intact sutures noted to lower gum line with minimal white drainage. No expressable purulence. Non-tender.  White film noted to tongue does not wipe off ncek- upon attempting to rotate her neck, resistance is met (that hurts my neck) RRR, no appreciable JVD, pulses 2+, no jugular cordis.  Lungs resonant throughout, even and non-labored Abd soft, +BS, nontender on exam,  No spinal or paraspinal tenderness on palpation, no CVA tenderness Skin warm, without rashes or edema  Lab Results  Recent Labs  03/11/16 0630 03/12/16 0515  WBC 8.0 7.5  HGB 9.9* 9.5*  HCT 30.5* 29.8*  NA 138 139  K 3.0* 2.8*  CL 108 107  CO2 22 22  BUN <5* 6  CREATININE 0.81 0.86   Liver Panel No results for input(s): PROT, ALBUMIN, AST, ALT, ALKPHOS, BILITOT, BILIDIR, IBILI in the last 72 hours. Sedimentation Rate No results for input(s): ESRSEDRATE in the last 72 hours. C-Reactive Protein No results for input(s): CRP in the last 72 hours.  Microbiology: Recent Results (from the past 240 hour(s))  Culture, blood (Routine x 2)     Status: None (Preliminary result)   Collection Time: 03/08/16 12:22 PM  Result Value  Ref Range Status   Specimen Description BLOOD LEFT WRIST  Final   Special Requests BOTTLES DRAWN AEROBIC AND ANAEROBIC  5CC  Final   Culture NO GROWTH 3 DAYS  Final   Report Status PENDING  Incomplete  Culture, blood (Routine x 2)     Status: None (Preliminary result)   Collection Time: 03/08/16 12:25 PM  Result Value Ref Range Status   Specimen Description BLOOD RIGHT WRIST  Final   Special Requests IN PEDIATRIC BOTTLE  3CC  Final   Culture NO GROWTH 3 DAYS  Final   Report Status PENDING  Incomplete  Urine culture     Status: None   Collection Time: 03/08/16  2:52 PM  Result Value Ref Range Status   Specimen Description URINE, RANDOM  Final   Special Requests NONE  Final   Culture NO GROWTH  Final    Report Status 03/09/2016 FINAL  Final  Stool culture (children & immunocomp patients)     Status: None (Preliminary result)   Collection Time: 03/08/16  9:14 PM  Result Value Ref Range Status   Salmonella/Shigella Screen Final report  Final    Comment: (NOTE) Performed At: [facility] [place], [place] [id number] Lindon Romp MD [id number]    Campylobacter Culture PENDING  Incomplete   E coli, Shiga toxin Assay Negative Negative Final    Comment: (NOTE) Performed At: [facility] [place], [place] [id number] Lindon Romp MD [id number]   STOOL CULTURE REFLEX - RSASHR     Status: None   Collection Time: 03/08/16  9:14 PM  Result Value Ref Range Status   Stool Culture result 1 (RSASHR) Comment  Final    Comment: (NOTE) No Salmonella or Shigella recovered. Performed At: [facility] [place], [place] [id number] Lindon Romp MD [id number]   Culture, blood (routine x 2)     Status: None (Preliminary result)   Collection Time: 03/09/16  6:35 AM  Result Value Ref Range Status   Specimen Description BLOOD LEFT ARM  Final   Special Requests BOTTLES DRAWN AEROBIC AND ANAEROBIC 5CC  Final   Culture NO GROWTH 2 DAYS  Final   Report Status PENDING  Incomplete  Culture, blood (routine x 2)     Status: None (Preliminary result)   Collection Time: 03/09/16  6:54 AM  Result Value Ref Range Status   Specimen Description BLOOD LEFT HAND  Final   Special Requests BOTTLES DRAWN AEROBIC AND ANAEROBIC 5CC  Final   Culture NO GROWTH 2 DAYS  Final   Report Status PENDING  Incomplete    Studies/Results: Dg Chest 2 View  Result Date: 03/11/2016 CLINICAL DATA:  Cough.  Hypertension. EXAM: CHEST  2 VIEW COMPARISON:  March 09, 2016 FINDINGS: There is no edema or consolidation. Heart is mildly enlarged with pulmonary vascularity within normal limits. There is atherosclerotic calcification in the aorta. No  adenopathy. There is evidence of old trauma involving the proximal left humerus, stable. Patient has had elbow replacement on the left. IMPRESSION: No edema or consolidation. Mild cardiomegaly. Aortic atherosclerosis. Electronically Signed   By: Lowella Grip III M.D.   On: 03/11/2016 10:24     Assessment/Plan: Total days of antibiotics: Prior to admission- Amoxicillin 9/17 - 9/18 9/18 Vanc >>9/19; 9/20 >> 9/18 Zosyn >> 9/19 9/19 Unasyn >> 9/20 9/20 Meropenem >>  Impression/Recommendation FUO Sepsis Acute encephalopathy Possible Dental Space Infection DM HTN AKI  Vanco/merrem started 9/20 Awaiting MRI, her  confusion is better now (husband attributes to starting her back on her psych meds).  Awaiting U/S of her neck to r/o septic jugular thrombophlebitis Await quant Gold If fever persists,  and other w/u negative, CT chest/abd/pelvis  Comment- Broad ddx- lemierre's, dental space infection, spread of infection from dental space?  BC x 2 9/17 ngtd BC x 2 9/18 ngtd UC negative Stool culture 9/17 >> ECHO 9/19 >> LVEF 60%, no RWA, grade 2 diastolic dysfunction, mild TR      Infectious Diseases (pager) [phone] [email or web address] 03/12/2016, 7:37 AM  LOS: 3 days

## 2016-03-13 LAB — GLUCOSE, CAPILLARY
Glucose-Capillary: 103 mg/dL — ABNORMAL HIGH (ref 65–99)
Glucose-Capillary: 108 mg/dL — ABNORMAL HIGH (ref 65–99)
Glucose-Capillary: 156 mg/dL — ABNORMAL HIGH (ref 65–99)
Glucose-Capillary: 147 mg/dL — ABNORMAL HIGH (ref 65–99)

## 2016-03-13 LAB — CULTURE, BLOOD (ROUTINE X 2)
Culture: NO GROWTH
Culture: NO GROWTH

## 2016-03-13 MED ORDER — POTASSIUM CHLORIDE CRYS ER 20 MEQ PO TBCR
40.0000 meq | EXTENDED_RELEASE_TABLET | Freq: Once | ORAL | Status: AC
  Administered 2016-03-13: 40 meq via ORAL
  Filled 2016-03-13: qty 2

## 2016-03-13 NOTE — Progress Notes (Signed)
INFECTIOUS DISEASE PROGRESS NOTE  ID: Latasha Wang is a 75 y.o. female with  Principal Problem:   Acute encephalopathy Active Problems:   Diabetes (HCC)   Anxiety state   Essential hypertension   GERD   Fever   Acute kidney injury (HCC)   Hyponatremia   Elevated lactic acid level   FUO (fever of unknown origin)   Controlled diabetes mellitus type 2 with complications (HCC)  Subjective:  Tmax 99.8, no complaints of pain.   More oriented.    Abtx:  Anti-infectives    Start     Dose/Rate Route Frequency Ordered Stop   03/11/16 1715  vancomycin (VANCOCIN) IVPB 750 mg/150 ml premix     750 mg 150 mL/hr over 60 Minutes Intravenous Every 12 hours 03/11/16 1703     03/11/16 1715  meropenem (MERREM) 1 g in sodium chloride 0.9 % 100 mL IVPB     1 g 200 mL/hr over 30 Minutes Intravenous Every 8 hours 03/11/16 1703     03/10/16 1800  Ampicillin-Sulbactam (UNASYN) 3 g in sodium chloride 0.9 % 100 mL IVPB  Status:  Discontinued     3 g 100 mL/hr over 60 Minutes Intravenous Every 8 hours 03/10/16 1634 03/11/16 1702   03/09/16 1000  vancomycin (VANCOCIN) IVPB 1000 mg/200 mL premix  Status:  Discontinued     1,000 mg 200 mL/hr over 60 Minutes Intravenous Every 24 hours 03/09/16 0923 03/10/16 1634   03/09/16 0930  piperacillin-tazobactam (ZOSYN) IVPB 3.375 g  Status:  Discontinued     3.375 g 12.5 mL/hr over 240 Minutes Intravenous Every 8 hours 03/09/16 0908 03/10/16 1634   03/08/16 1715  amoxicillin (AMOXIL) capsule 500 mg  Status:  Discontinued     500 mg Oral Every 8 hours 03/08/16 1712 03/09/16 0900      Medications: I have reviewed the patient's current medications.  Objective: Vital signs in last 24 hours: Temp:  [98.7 F (37.1 C)-99.8 F (37.7 C)] 98.7 F (37.1 C) (09/22 0541) Pulse Rate:  [55-64] 55 (09/22 0541) Resp:  [18-19] 18 (09/22 0541) BP: (126-143)/(60-64) 143/64 (09/22 0541) SpO2:  [98 %-100 %] 98 % (09/22 0541)   Patient is sleepy, alert to voice,  sitting in recliner in no distress Oriented to person, place, month, hospital.  Moves all extremities.  PERRL, scleral anicteric  Mucous Membranes moist, intact sutures noted to lower gum line with minimal white drainage. No expressable purulence. Non-tender.   ncek- no pain with ROM of neck  RRR, no appreciable JVD, pulses 2+, no jugular cordis.  Lungs resonant throughout, even and non-labored Abd soft, +BS, nontender on exam,  No spinal or paraspinal tenderness on palpation, no CVA tenderness Skin warm, without rashes or edema  Lab Results  Recent Labs  03/11/16 0630 03/12/16 0515 03/12/16 1427 03/12/16 2313  WBC 8.0 7.5  --   --   HGB 9.9* 9.5*  --   --   HCT 30.5* 29.8*  --   --   NA 138 139  --   --   K 3.0* 2.8* 2.8* 3.2*  CL 108 107  --   --   CO2 22 22  --   --   BUN <5* 6  --   --   CREATININE 0.81 0.86  --   --    Liver Panel No results for input(s): PROT, ALBUMIN, AST, ALT, ALKPHOS, BILITOT, BILIDIR, IBILI in the last 72 hours. Sedimentation Rate No results for input(s): ESRSEDRATE in  the last 72 hours. C-Reactive Protein No results for input(s): CRP in the last 72 hours.  Microbiology: Recent Results (from the past 240 hour(s))  Culture, blood (Routine x 2)     Status: None (Preliminary result)   Collection Time: 03/08/16 12:22 PM  Result Value Ref Range Status   Specimen Description BLOOD LEFT WRIST  Final   Special Requests BOTTLES DRAWN AEROBIC AND ANAEROBIC  5CC  Final   Culture NO GROWTH 4 DAYS  Final   Report Status PENDING  Incomplete  Culture, blood (Routine x 2)     Status: None (Preliminary result)   Collection Time: 03/08/16 12:25 PM  Result Value Ref Range Status   Specimen Description BLOOD RIGHT WRIST  Final   Special Requests IN PEDIATRIC BOTTLE  3CC  Final   Culture NO GROWTH 4 DAYS  Final   Report Status PENDING  Incomplete  Urine culture     Status: None   Collection Time: 03/08/16  2:52 PM  Result Value Ref Range Status   Specimen  Description URINE, RANDOM  Final   Special Requests NONE  Final   Culture NO GROWTH  Final   Report Status 03/09/2016 FINAL  Final  Stool culture (children & immunocomp patients)     Status: None (Preliminary result)   Collection Time: 03/08/16  9:14 PM  Result Value Ref Range Status   Salmonella/Shigella Screen Final report  Final    Comment: (NOTE) Performed At: [facility] [address] [place], [place] [id number] Mila Homer MD [id number]    Campylobacter Culture PENDING  Incomplete   E coli, Shiga toxin Assay Negative Negative Final    Comment: (NOTE) Performed At: [facility] [address] [place], [place] [id number] Mila Homer MD [id number]   STOOL CULTURE REFLEX - RSASHR     Status: None   Collection Time: 03/08/16  9:14 PM  Result Value Ref Range Status   Stool Culture result 1 (RSASHR) Comment  Final    Comment: (NOTE) No Salmonella or Shigella recovered. Performed At: [facility] [address] [place], [place] [id number] Mila Homer MD [id number]   Culture, blood (routine x 2)     Status: None (Preliminary result)   Collection Time: 03/09/16  6:35 AM  Result Value Ref Range Status   Specimen Description BLOOD LEFT ARM  Final   Special Requests BOTTLES DRAWN AEROBIC AND ANAEROBIC 5CC  Final   Culture NO GROWTH 3 DAYS  Final   Report Status PENDING  Incomplete  Culture, blood (routine x 2)     Status: None (Preliminary result)   Collection Time: 03/09/16  6:54 AM  Result Value Ref Range Status   Specimen Description BLOOD LEFT HAND  Final   Special Requests BOTTLES DRAWN AEROBIC AND ANAEROBIC 5CC  Final   Culture NO GROWTH 3 DAYS  Final   Report Status PENDING  Incomplete    Studies/Results: Dg Chest 2 View  Result Date: 03/11/2016 CLINICAL DATA:  Cough.  Hypertension. EXAM: CHEST  2 VIEW COMPARISON:  March 09, 2016 FINDINGS: There is no edema or consolidation. Heart is mildly enlarged with pulmonary  vascularity within normal limits. There is atherosclerotic calcification in the aorta. No adenopathy. There is evidence of old trauma involving the proximal left humerus, stable. Patient has had elbow replacement on the left. IMPRESSION: No edema or consolidation. Mild cardiomegaly. Aortic atherosclerosis. Electronically Signed   By: Bretta Bang III M.D.   On: 03/11/2016 10:24   Mr  Brain W Wo Contrast  Result Date: 03/12/2016 CLINICAL DATA:  75 y.o. female admitted on 03/08/2016 with rule out meningitis - patient has fever of unknown origin and possible dental space infection. Patient is agitated and confused with neck pain EXAM: MRI HEAD WITHOUT AND WITH CONTRAST TECHNIQUE: Multiplanar, multiecho pulse sequences of the brain and surrounding structures were obtained without and with intravenous contrast. CONTRAST:  15mL MULTIHANCE GADOBENATE DIMEGLUMINE 529 MG/ML IV SOLN COMPARISON:  CT head 03/09/2016. FINDINGS: The patient was unable to remain motionless for the exam. Small or subtle lesions could be overlooked. Brain: No evidence for acute infarction, hemorrhage, mass lesion, hydrocephalus, or extra-axial fluid. Mild atrophy. Minor white matter disease, nonspecific. Post infusion, no abnormal enhancement of the brain or meninges. Vascular: Normal flow voids. Skull and upper cervical spine: Partial empty sella. No midline abnormality. Sinuses/Orbits: No orbital masses or proptosis. Globes appear symmetric. Sinuses appear well aerated, without evidence for air-fluid level. Other: No nasopharyngeal pathology or mastoid fluid. Scalp and other visualized extracranial soft tissues grossly unremarkable. IMPRESSION: Motion degraded examination demonstrating atrophy without acute intracranial findings. No abnormal postcontrast enhancement or restricted diffusion to suggest intracranial infection/inflammatory process. No significant sinus disease or parameningeal focus of infection. Electronically Signed   By:  Elsie StainJohn T Curnes M.D.   On: 03/12/2016 17:59   ECHO 9/19 >> LVEF 60%, no RWA, grade 2 diastolic dysfunction, mild TR  VAS US UPPER EXTREMITY VENOUS DUPLEX 9/21 >> Bilateral internal jugular veins are compressible and no evidence of deep vein thrombosis.    Assessment/Plan: Total days of antibiotics: Prior to admission- Amoxicillin 9/17 - 9/18 9/18 Vanc >>9/19; 9/20 >> 9/18 Zosyn >> 9/19 9/19 Unasyn >> 9/20 9/20 Meropenem >>  Impression/Recommendation Fever  Sepsis Acute encephalopathy Possible Dental Space Infection DM HTN AKI  Vanco/merrem started 9/20- with her fevers improving since then MRI- motion degraded study but showed no acute findings or concern for infection/inflammatory process.   U/S of neck to r/o septic jugular thrombophlebitis- negative Await quant Gold Her mental status has improved since home psych meds (except zyprexa) restarted   Consider CT chest/abd/pelvis if any further fevers.    BC x 2 9/17 ngtd BC x 2 9/18 ngtd UC negative Stool culture 9/17 >> so far negative     Infectious Diseases (pager) [phone] [email or web address] 03/13/2016, 8:03 AM  LOS: 4 days

## 2016-03-13 NOTE — Evaluation (Signed)
Occupational Therapy Evaluation Patient Details Name: Latasha Wang T Kines MRN: [id number] DOB: 08/15/1940 Today's Date: 03/13/2016    History of Present Illness Latasha Wang is an 75 y.o. female with a PMH of DM, HTN, fatty liver disease, HLD, GERD, anxiety, and depression admitted on 9/17 for fever with unknown source and acute encephalopathy   Clinical Impression   Pt with decline in function and safety with ADLs and ADL mobility with decreased strength, balance, endurance and cognition. Pt would benefit from acute OT services to address impairments to increase level of function and safety    Follow Up Recommendations  CIR    Equipment Recommendations  None recommended by OT    Recommendations for Other Services       Precautions / Restrictions Precautions Precautions: Fall Restrictions Weight Bearing Restrictions: No      Mobility Bed Mobility               General bed mobility comments: pt up in recliner  Transfers Overall transfer level: Needs assistance Equipment used: Rolling walker (2 wheeled);None Transfers: Sit to/from Stand Sit to Stand: Min guard         General transfer comment: steadying assist, verbal and physical cues for sequencing and safety    Balance     Sitting balance-Leahy Scale: Good       Standing balance-Leahy Scale: Fair                              ADL Overall ADL's : Needs assistance/impaired Eating/Feeding: Set up;Sitting   Grooming: Wash/dry hands;Wash/dry face;Set up;Sitting   Upper Body Bathing: Set up;Supervision/ safety;Sitting Upper Body Bathing Details (indicate cue type and reason): cues for initiation and thoroughness (simulated) Lower Body Bathing: Minimal assistance Lower Body Bathing Details (indicate cue type and reason): cues for initiation and thoroughness (simulated) Upper Body Dressing : Set up;Supervision/safety Upper Body Dressing Details (indicate cue type and reason): cues for  initiation (simulated) Lower Body Dressing: Min guard;Sitting/lateral leans Lower Body Dressing Details (indicate cue type and reason): doffing/donning socks Toilet Transfer: Min guard;RW;BSC;Cueing for sequencing;Cueing for safety   Toileting- Clothing Manipulation and Hygiene: Min guard;Sit to/from stand       Functional mobility during ADLs: Min guard;Cueing for safety;Cueing for sequencing       Vision  no change from baseline              Pertinent Vitals/Pain Pain Assessment: No/denies pain     Hand Dominance Right   Extremity/Trunk Assessment Upper Extremity Assessment Upper Extremity Assessment: Generalized weakness       Cervical / Trunk Assessment Cervical / Trunk Assessment: Kyphotic   Communication Communication Communication: No difficulties   Cognition Arousal/Alertness: Awake/alert Behavior During Therapy: Impulsive Overall Cognitive Status: Impaired/Different from baseline Area of Impairment: Orientation;Memory;Following commands;Problem solving Orientation Level: Disoriented to;Time;Situation   Memory: Decreased short-term memory Following Commands: Follows one step commands inconsistently;Follows one step commands with increased time     Problem Solving: Slow processing;Decreased initiation;Difficulty sequencing;Requires tactile cues;Requires verbal cues     General Comments   pt pleasant and cooperative                 Home Living Family/patient expects to be discharged to:: Private residence Living Arrangements: Children;Spouse/significant other Available Help at Discharge: Family Type of Home: House Home Access: Stairs to enter Entergy CorporationEntrance Stairs-Number of Steps: 4 Entrance Stairs-Rails: Right;Left Home Layout: Two level;Bed/bath upstairs;1/2 bath on main level Alternate  Level Stairs-Number of Steps: flight Alternate Level Stairs-Rails: Right;Left Bathroom Shower/Tub: Chief Strategy Officer: Standard     Home  Equipment: None          Prior Functioning/Environment Level of Independence: Independent                 OT Problem List: Decreased strength;Decreased activity tolerance;Decreased knowledge of use of DME or AE;Decreased safety awareness;Decreased cognition;Impaired balance (sitting and/or standing)   OT Treatment/Interventions: Self-care/ADL training;DME and/or AE instruction;Therapeutic activities;Cognitive remediation/compensation;Patient/family education    OT Goals(Current goals can be found in the care plan section) Acute Rehab OT Goals Patient Stated Goal: get well and go home OT Goal Formulation: With patient Time For Goal Achievement: 03/20/16 Potential to Achieve Goals: Good ADL Goals Pt Will Perform Grooming: with set-up;with supervision;standing Pt Will Perform Upper Body Bathing: with set-up;standing;sitting Pt Will Perform Lower Body Bathing: with supervision;with set-up;sitting/lateral leans;sit to/from stand Pt Will Perform Upper Body Dressing: with set-up;standing;sitting Pt Will Perform Lower Body Dressing: with supervision;with set-up;sitting/lateral leans;sit to/from stand Pt Will Transfer to Toilet: with supervision;ambulating;regular height toilet;grab bars Pt Will Perform Toileting - Clothing Manipulation and hygiene: with supervision;sit to/from stand Pt Will Perform Tub/Shower Transfer: with min guard assist;with supervision;ambulating Additional ADL Goal #1: Pt will initiate and complete ADL and ADL mobility tasks with min verbal cues   OT Frequency: Min 2X/week   Barriers to D/C: Decreased caregiver support                        End of Session Equipment Utilized During Treatment: Gait belt;Rolling walker;Other (comment) (BSC)  Activity Tolerance: Patient tolerated treatment well Patient left: in chair;with call bell/phone within reach;with chair alarm set   Time: 1610-9604 OT Time Calculation (min): 24 min Charges:  OT General  Charges $OT Visit: 1 Procedure OT Evaluation $OT Eval Moderate Complexity: 1 Procedure OT Treatments $Therapeutic Activity: 8-22 mins G-Codes:    Galen Manila 03/13/2016, 9:50 AM

## 2016-03-13 NOTE — Progress Notes (Signed)
PROGRESS NOTE    Latasha Wang  WUJ:[id number] DOB: [birth year] DOA: 03/08/2016 PCP: Latasha Barre, MD     Brief Narrative:  75 y.o. AF with medical history significant for diabetes, hypertension, fatty liver disease, depression, anxiety recent extraction of 14 teeth presents to the emergency department with chief complaint of fever. Initial evaluation reveals a temperature of 103.2 orally, elevated lactic acid, hyponatremia, acute kidney injury and acute encephalopathy.  Information is obtained from the patient and the daughter who is at the bedside. 2 days ago she had 14 teeth extracted anticipation of dentures. Daughter states done yesterday reported that "a couple of teeth had decay in a couple had abscesses". She was sent home with prednisone taper which is completed and amoxicillin. Over the last 2 days she became gradually more lethargic and fevers. This morning patient was unable to bear weight became incontinent of urine. She is symptoms include decreased oral intake over the last 2 days complaints of low back pain. He has not had any pain medicine for 2 days. She has been taken her antibiotics. No complaints of headache dizziness syncope or near-syncope. No complaints of chest pain palpitation shortness of breath. No cough no dysuria hematuria frequency or urgency. No diarrhea abdominal pain nausea vomiting.  Subjective: Pt has no new complaints. No acute issues overnight.   Assessment & Plan:   Principal Problem:   Acute encephalopathy Active Problems:   Diabetes (HCC)   Anxiety state   Essential hypertension   GERD   Fever   Acute kidney injury (HCC)   Hyponatremia   Elevated lactic acid level   FUO (fever of unknown origin)   Controlled diabetes mellitus type 2 with complications (HCC)   Acute encephalopathy  -no fevers in the last 24 hours   FUO  - Etiology uncertain (does not meet sepsis criteria). Urinalysis and chest x-ray unremarkable. -She did have 14 teeth  extracted 2 days ago. Reportedly due to abscesses and decay. She's been on amoxicillin ever since. Lactic acid mildly elevated. She is hemodynamically stable nontoxic appearing -CT maxillofacial rule out abscess: Negative abscess see results below - U/S of her neck reporting no evidence of deep vein thrombosis If fever persists,  and other w/u negative, CT chest/abd/pelvis  Acute kidney injury. -Resolved   Hyponatremia.  -Resolved   Hypertension.  -within normal limits off antihypertensives  Diabetes Type 2 controlled with complication.  -Patient is on oral agents.  -9/17 Hemoglobin A1c= 6.3 -Sensitive SSI  Anxiety/Depression.  - Wellbutrin 150 mg daily -Clonazepam 0.5 mg BID -Prozac 40 mg daily  Status post teeth extraction.  -Dr. Kristin Wang consulted appears on 9/20. Awaiting recommendations   Hypokalemia -potassium still low will replace orally and reassess next am.  Hypomagnesemia -wnl after replacement  DVT prophylaxis: Lovenox Code Status: Full Family Communication: Spoke with patient Disposition Plan: Await recommendations from ID and Dr. Kristin Wang   Consultants:  Latasha Wang ID  Procedures/Significant Events:  9/17 CT maxillofacial W contrast: Negative abscess 9/18 CT head: Negative abnormality 9/21 MRI brain; negative acute infarct  Cultures 9/17 blood left/right wrist NGTD 9/17 urine negative 9/17 stool in NGTD 9/18 blood left arm/hand NGTD   Antimicrobials: Amoxicillin 9/17>> 9/18 (note was on amoxicillin as outpatient also) Unasyn 9/19>> 9/20 Zosyn 9/18>> 9/19 Meropenem 9/20>> Vancomycin 9/18>>  Devices None   LINES / TUBES:  None  Continuous Infusions: . sodium chloride 75 mL/hr at 03/12/16 2228     Objective: Vitals:   03/12/16 1417 03/12/16 2143 03/13/16 0541  03/13/16 1342  BP: 126/60 (!) 143/63 (!) 143/64 140/60  Pulse: 64 (!) 55 (!) 55 64  Resp: 19 18 18 14   Temp: 99.6 F (37.6 C) 99.8 F (37.7 C) 98.7 F  (37.1 C) 99.6 F (37.6 C)  TempSrc:   Oral Oral  SpO2: 100% 98% 98% 98%  Weight:      Height:        Intake/Output Summary (Last 24 hours) at 03/13/16 1714 Last data filed at 03/13/16 1502  Gross per 24 hour  Intake              765 ml  Output             1400 ml  Net             -635 ml   Filed Weights   03/08/16 1207 03/09/16 0402  Weight: 77.1 kg (170 lb) 75.7 kg (166 lb 14.2 oz)    Examination:  General: Sleepy but arousable, A/O 4, NAD, No acute respiratory distress Eyes: negative scleral hemorrhage, negative anisocoria, negative icterus ENT: Negative Runny nose, negative gingival bleeding, Neck:  Negative scars, masses, torticollis, lymphadenopathy, JVD Lungs: Clear to auscultation bilaterally without wheezes or crackles Cardiovascular: Regular rate and rhythm without murmur gallop or rub normal S1 and S2 Abdomen: negative abdominal pain, nondistended, positive soft, bowel sounds, no rebound, no ascites, no appreciable mass Extremities: No significant cyanosis, clubbing, or edema bilateral lower extremities Skin: Negative rashes, lesions, ulcers Psychiatric:  Negative depression, negative anxiety, negative fatigue, negative mania  Central nervous system:  No facial asymmetry, tongue/uvula midline, all extremities muscle strength 5/5, sensation intact throughout,negative dysarthria, negative expressive aphasia, negative receptive aphasia.  .     Data Reviewed: Care during the described time interval was provided by me .  I have reviewed this patient's available data, including medical history, events of note, physical examination, and all test results as part of my evaluation. I have personally reviewed and interpreted all radiology studies.  CBC:  Recent Labs Lab 03/08/16 1222 03/09/16 0639 03/10/16 0443 03/11/16 0630 03/12/16 0515  WBC 9.1 9.9 8.3 8.0 7.5  NEUTROABS 6.5  --   --   --   --   HGB 11.2* 9.9* 10.1* 9.9* 9.5*  HCT 35.0* 30.6* 32.3* 30.5* 29.8*    MCV 90.0 88.7 89.5 88.9 89.0  PLT 196 175 180 182 161   Basic Metabolic Panel:  Recent Labs Lab 03/08/16 1222 03/09/16 0639 03/10/16 0443 03/11/16 0630 03/12/16 0515 03/12/16 1427 03/12/16 2313  NA 131* 136 138 138 139  --   --   K 3.7 3.5 3.2* 3.0* 2.8* 2.8* 3.2*  CL 100* 106 106 108 107  --   --   CO2 23 19* 23 22 22   --   --   GLUCOSE 115* 120* 121* 99 109*  --   --   BUN 16 15 9  <5* 6  --   --   CREATININE 1.16* 1.47* 1.02* 0.81 0.86  --   --   CALCIUM 8.7* 8.2* 8.2* 8.2* 8.0*  --   --   MG  --   --   --   --   --  1.9  --    GFR: Estimated Creatinine Clearance (by C-G formula based on SCr of 0.86 mg/dL) Female: 16.1 mL/min Female: 59.8 mL/min Liver Function Tests:  Recent Labs Lab 03/08/16 1222  AST 22  ALT 18  ALKPHOS 44  BILITOT 2.1*  PROT 7.3  ALBUMIN  3.8   No results for input(s): LIPASE, AMYLASE in the last 168 hours. No results for input(s): AMMONIA in the last 168 hours. Coagulation Profile: No results for input(s): INR, PROTIME in the last 168 hours. Cardiac Enzymes: No results for input(s): CKTOTAL, CKMB, CKMBINDEX, TROPONINI in the last 168 hours. BNP (last 3 results) No results for input(s): PROBNP in the last 8760 hours. HbA1C: No results for input(s): HGBA1C in the last 72 hours. CBG:  Recent Labs Lab 03/12/16 1805 03/12/16 2141 03/13/16 0759 03/13/16 1201 03/13/16 1659  GLUCAP 123* 126* 103* 108* 156*   Lipid Profile: No results for input(s): CHOL, HDL, LDLCALC, TRIG, CHOLHDL, LDLDIRECT in the last 72 hours. Thyroid Function Tests: No results for input(s): TSH, T4TOTAL, FREET4, T3FREE, THYROIDAB in the last 72 hours. Anemia Panel: No results for input(s): VITAMINB12, FOLATE, FERRITIN, TIBC, IRON, RETICCTPCT in the last 72 hours. Sepsis Labs:  Recent Labs Lab 03/08/16 1256 03/08/16 1602 03/08/16 1720  LATICACIDVEN 2.02* 1.00 1.5    Recent Results (from the past 240 hour(s))  Culture, blood (Routine x 2)     Status: None    Collection Time: 03/08/16 12:22 PM  Result Value Ref Range Status   Specimen Description BLOOD LEFT WRIST  Final   Special Requests BOTTLES DRAWN AEROBIC AND ANAEROBIC  5CC  Final   Culture NO GROWTH 5 DAYS  Final   Report Status 03/13/2016 FINAL  Final  Culture, blood (Routine x 2)     Status: None   Collection Time: 03/08/16 12:25 PM  Result Value Ref Range Status   Specimen Description BLOOD RIGHT WRIST  Final   Special Requests IN PEDIATRIC BOTTLE  3CC  Final   Culture NO GROWTH 5 DAYS  Final   Report Status 03/13/2016 FINAL  Final  Urine culture     Status: None   Collection Time: 03/08/16  2:52 PM  Result Value Ref Range Status   Specimen Description URINE, RANDOM  Final   Special Requests NONE  Final   Culture NO GROWTH  Final   Report Status 03/09/2016 FINAL  Final  Stool culture (children & immunocomp patients)     Status: None (Preliminary result)   Collection Time: 03/08/16  9:14 PM  Result Value Ref Range Status   Salmonella/Shigella Screen Final report  Final    Comment: (NOTE) Performed At: [facility] [address] [place], [place] [id number] Mila HomerHancock William F MD GM:[id number]:[phone]    Campylobacter Culture PENDING  Incomplete   E coli, Shiga toxin Assay Negative Negative Final    Comment: (NOTE) Performed At: [facility] [address] [place], [place] [id number] Mila HomerHancock William F MD ZD:[id number]:[phone]   STOOL CULTURE REFLEX - RSASHR     Status: None   Collection Time: 03/08/16  9:14 PM  Result Value Ref Range Status   Stool Culture result 1 (RSASHR) Comment  Final    Comment: (NOTE) No Salmonella or Shigella recovered. Performed At: [facility] [place] [address] [place], [place] [id number] Mila HomerHancock William F MD AY:[id number]:[phone]   Culture, blood (routine x 2)     Status: None (Preliminary result)   Collection Time: 03/09/16  6:35 AM  Result Value Ref Range Status   Specimen Description BLOOD LEFT ARM  Final   Special Requests BOTTLES DRAWN  AEROBIC AND ANAEROBIC 5CC  Final   Culture NO GROWTH 4 DAYS  Final   Report Status PENDING  Incomplete  Culture, blood (routine x 2)     Status: None (Preliminary result)  Collection Time: 03/09/16  6:54 AM  Result Value Ref Range Status   Specimen Description BLOOD LEFT HAND  Final   Special Requests BOTTLES DRAWN AEROBIC AND ANAEROBIC 5CC  Final   Culture NO GROWTH 4 DAYS  Final   Report Status PENDING  Incomplete         Radiology Studies: [name] Contrast  Result Date: 03/12/2016 CLINICAL DATA:  75 y.o. female admitted on 03/08/2016 with rule out meningitis - patient has fever of unknown origin and possible dental space infection. Patient is agitated and confused with neck pain EXAM: MRI HEAD WITHOUT AND WITH CONTRAST TECHNIQUE: Multiplanar, multiecho pulse sequences of the brain and surrounding structures were obtained without and with intravenous contrast. CONTRAST:  15mL MULTIHANCE GADOBENATE DIMEGLUMINE 529 MG/ML IV SOLN COMPARISON:  CT head 03/09/2016. FINDINGS: The patient was unable to remain motionless for the exam. Small or subtle lesions could be overlooked. Brain: No evidence for acute infarction, hemorrhage, mass lesion, hydrocephalus, or extra-axial fluid. Mild atrophy. Minor white matter disease, nonspecific. Post infusion, no abnormal enhancement of the brain or meninges. Vascular: Normal flow voids. Skull and upper cervical spine: Partial empty sella. No midline abnormality. Sinuses/Orbits: No orbital masses or proptosis. Globes appear symmetric. Sinuses appear well aerated, without evidence for air-fluid level. Other: No nasopharyngeal pathology or mastoid fluid. Scalp and other visualized extracranial soft tissues grossly unremarkable. IMPRESSION: Motion degraded examination demonstrating atrophy without acute intracranial findings. No abnormal postcontrast enhancement or restricted diffusion to suggest intracranial infection/inflammatory process. No significant sinus  disease or parameningeal focus of infection. Electronically Signed   By: Elsie Stain M.D.   On: 03/12/2016 17:59        Scheduled Meds: . buPROPion  150 mg Oral Daily  . clonazePAM  0.5 mg Oral BID  . enoxaparin (LOVENOX) injection  40 mg Subcutaneous Q24H  . FLUoxetine  40 mg Oral QHS  . insulin aspart  0-5 Units Subcutaneous QHS  . insulin aspart  0-9 Units Subcutaneous TID WC  . meropenem (MERREM) IV  1 g Intravenous Q8H  . rosuvastatin  10 mg Oral Daily  . vancomycin  750 mg Intravenous Q12H   Continuous Infusions: . sodium chloride 75 mL/hr at 03/12/16 2228     LOS: 4 days    Time spent:35 min  Penny Pia, MD [facility] Pager [phone]  If 7PM-7AM, please contact night-coverage [email or web address] Password [facility] 03/13/2016, 5:14 PM

## 2016-03-13 NOTE — Progress Notes (Signed)
Pharmacy Antibiotic Note  Latasha Wang is a 75 y.o. female admitted on 03/08/2016 with rule out meningitis - patient has fever of unknown origin and possible dental space infection.  Pharmacy was consulted for restart of Vancomycin and initiation of Meropenem dosing.  Patient agitation and confusion has improved. WBC is within normal limits. 24 hour Tmax 99.8.  SCr has improved- currently 0.86 with estimated CrCl ~2650mL/min.   Plan: Vancomycin 750mg  IV every 12 hours.  Goal trough 15-20 mcg/mL. Meropenem 1g IV every 8 hours.   Monitor renal function, clinical status, and culture results.  Consider obtaining vancomycin trough over weekend if vancomycin continues.   Height: 4\' 9"  (144.8 cm) Weight: 166 lb 14.2 oz (75.7 kg) IBW/kg (Calculated) : 38.6  Temp (24hrs), Avg:99.4 F (37.4 C), Min:98.7 F (37.1 C), Max:99.8 F (37.7 C)   Recent Labs Lab 03/08/16 1222 03/08/16 1256 03/08/16 1602 03/08/16 1720 03/09/16 0639 03/10/16 0443 03/11/16 0630 03/12/16 0515  WBC 9.1  --   --   --  9.9 8.3 8.0 7.5  CREATININE 1.16*  --   --   --  1.47* 1.02* 0.81 0.86  LATICACIDVEN  --  2.02* 1.00 1.5  --   --   --   --     Estimated Creatinine Clearance (by C-G formula based on SCr of 0.86 mg/dL) Female: 08.648.4 mL/min Female: 59.8 mL/min    No Known Allergies  Antimicrobials this admission: Prior to admission- Amoxicillin 9/17 - 9/18 9/18 Vanc >>9/19; 9/20 >> 9/18 Zosyn >> 9/19 9/19 Unasyn >> 9/20 9/20 Meropenem >>  Dose adjustments this admission: na  Microbiology results: 9/17 BCx: ngtd x4 days 9/17 UCx: negative 9/17 Stool culture: Shigella, salmonella, and E.coli negative 9/18 BCx: ngtd x3 days  Thank you for allowing pharmacy to be a part of this patient's care.  Steffanie RainwaterKathryn Holli Rengel, Student-PharmD 03/13/2016 8:29 AM

## 2016-03-13 NOTE — Progress Notes (Signed)
Physical Therapy Treatment Patient Details Name: Latasha Wang T Huesman MRN: [id number] DOB: 02/27/1941 Today's Date: 03/13/2016    History of Present Illness Latasha Wang is an 75 y.o. female with a PMH of DM, HTN, fatty liver disease, HLD, GERD, anxiety, and depression admitted on 9/17 for fever with unknown source and acute encephalopathy    PT Comments    Patient progressing with ambulation distance/speed, but still very unsafe for ambulation unaided.  Demonstrates flat affect, poor trunk rotation and decreased stride length/foot clearance at high risk for anterior LOB.  Feel continued skilled PT in the acute setting will assist pt to d/c home with family support following CIR level rehab stay.   Follow Up Recommendations  CIR     Equipment Recommendations  Other (comment) (TBA)    Recommendations for Other Services       Precautions / Restrictions Precautions Precautions: Fall    Mobility  Bed Mobility               General bed mobility comments: pt up in recliner  Transfers Overall transfer level: Needs assistance Equipment used: None Transfers: Sit to/from Stand Sit to Stand: Min assist         General transfer comment: assist to stand; for weight shift and for balance   Ambulation/Gait Ambulation/Gait assistance: Min assist;Mod assist Ambulation Distance (Feet): 120 Feet Assistive device: None;1 person hand held assist Gait Pattern/deviations: Shuffle;Decreased stride length;Narrow base of support;Step-to pattern     General Gait Details: Initially with RW, pt stood and held on but did not push walker until needing to use bathroom, then pushed it out of [facility] way [facility]; without device, but min to mod A for balance/safety. Shuffling pattern and at times with anterior bias, cue for increased foot clearance and for safe speed; pt with minimal arm swing, decreased trunk rotation. poor ability to negotiate around Leisure centre managerobstacles   Stairs             Wheelchair Mobility    Modified Rankin (Stroke Patients Only)       Balance     Sitting balance-Leahy Scale: Good     Standing balance support: No upper extremity supported Standing balance-Leahy Scale: Fair Standing balance comment: static stance with supervision for safety                    Cognition Arousal/Alertness: Awake/alert Behavior During Therapy: Flat affect Overall Cognitive Status: Impaired/Different from baseline Area of Impairment: Orientation;Memory;Following commands;Problem solving Orientation Level: Disoriented to;Time;Situation   Memory: Decreased short-term memory Following Commands: Follows one step commands inconsistently;Follows one step commands with increased time     Problem Solving: Slow processing;Decreased initiation;Difficulty sequencing;Requires tactile cues;Requires verbal cues      Exercises      General Comments        Pertinent Vitals/Pain Pain Assessment: No/denies pain    Home Living                      Prior Function            PT Goals (current goals can now be found in the care plan section) Progress towards PT goals: Progressing toward goals    Frequency    Min 3X/week      PT Plan Current plan remains appropriate    Co-evaluation             End of Session Equipment Utilized During Treatment: Gait belt Activity Tolerance: Patient tolerated  treatment well Patient left: with call bell/phone within reach;in chair;with chair alarm set     Time: 1015-1044 PT Time Calculation (min) (ACUTE ONLY): 29 min  Charges:  $Gait Training: 8-22 mins $Therapeutic Activity: 8-22 mins                    G Codes:      Elray Mcgregor 2016-04-04, 1:56 PM  Sheran Lawless, PT [phone] 04-04-2016

## 2016-03-13 NOTE — Care Management Important Message (Signed)
Important Message  Patient Details  Name: Latasha Wang MRN: [id number] Date of Birth: 01/23/1941   Medicare Important Message Given:  Yes    Ulysses Alper Stefan ChurchBratton 03/13/2016, 1:43 PM

## 2016-03-14 LAB — BASIC METABOLIC PANEL
ANION GAP: 11 (ref 5–15)
BUN: <5 mg/dL — ABNORMAL LOW (ref 6–20)
CALCIUM: 8.8 mg/dL — AB (ref 8.9–10.3)
CHLORIDE: 104 mmol/L (ref 101–111)
CO2: 26 mmol/L (ref 22–32)
Creatinine, Ser: 0.73 mg/dL (ref 0.44–1.00)
GFR calc non Af Amer: >60 mL/min (ref >60)
Glucose, Bld: 109 mg/dL — ABNORMAL HIGH (ref 65–99)
POTASSIUM: 3.4 mmol/L — AB (ref 3.5–5.1)
Sodium: 141 mmol/L (ref 135–145)

## 2016-03-14 LAB — STOOL CULTURE REFLEX - CMPCXR

## 2016-03-14 LAB — CULTURE, BLOOD (ROUTINE X 2)
CULTURE: NO GROWTH
Culture: NO GROWTH

## 2016-03-14 LAB — STOOL CULTURE REFLEX - RSASHR

## 2016-03-14 LAB — GLUCOSE, CAPILLARY
GLUCOSE-CAPILLARY: 127 mg/dL — AB (ref 65–99)
Glucose-Capillary: 109 mg/dL — ABNORMAL HIGH (ref 65–99)
Glucose-Capillary: 108 mg/dL — ABNORMAL HIGH (ref 65–99)
Glucose-Capillary: 107 mg/dL — ABNORMAL HIGH (ref 65–99)

## 2016-03-14 LAB — STOOL CULTURE: E COLI SHIGA TOXIN ASSAY: NEGATIVE

## 2016-03-14 NOTE — Progress Notes (Signed)
INFECTIOUS DISEASE PROGRESS NOTE  ID: Latasha Wang is a 75 y.o. female with  Principal Problem:   Acute encephalopathy Active Problems:   Diabetes (HCC)   Anxiety state   Essential hypertension   GERD   Fever   Acute kidney injury (HCC)   Hyponatremia   Elevated lactic acid level   FUO (fever of unknown origin)   Controlled diabetes mellitus type 2 with complications (HCC)  Subjective: No complaints.  Normal BM, urine, no cough/sob.   Abtx:  Anti-infectives    Start     Dose/Rate Route Frequency Ordered Stop   03/11/16 1715  vancomycin (VANCOCIN) IVPB 750 mg/150 ml premix     750 mg 150 mL/hr over 60 Minutes Intravenous Every 12 hours 03/11/16 1703     03/11/16 1715  meropenem (MERREM) 1 g in sodium chloride 0.9 % 100 mL IVPB     1 g 200 mL/hr over 30 Minutes Intravenous Every 8 hours 03/11/16 1703     03/10/16 1800  Ampicillin-Sulbactam (UNASYN) 3 g in sodium chloride 0.9 % 100 mL IVPB  Status:  Discontinued     3 g 100 mL/hr over 60 Minutes Intravenous Every 8 hours 03/10/16 1634 03/11/16 1702   03/09/16 1000  vancomycin (VANCOCIN) IVPB 1000 mg/200 mL premix  Status:  Discontinued     1,000 mg 200 mL/hr over 60 Minutes Intravenous Every 24 hours 03/09/16 0923 03/10/16 1634   03/09/16 0930  piperacillin-tazobactam (ZOSYN) IVPB 3.375 g  Status:  Discontinued     3.375 g 12.5 mL/hr over 240 Minutes Intravenous Every 8 hours 03/09/16 0908 03/10/16 1634   03/08/16 1715  amoxicillin (AMOXIL) capsule 500 mg  Status:  Discontinued     500 mg Oral Every 8 hours 03/08/16 1712 03/09/16 0900      Medications:  Scheduled: . buPROPion  150 mg Oral Daily  . clonazePAM  0.5 mg Oral BID  . enoxaparin (LOVENOX) injection  40 mg Subcutaneous Q24H  . FLUoxetine  40 mg Oral QHS  . insulin aspart  0-5 Units Subcutaneous QHS  . insulin aspart  0-9 Units Subcutaneous TID WC  . meropenem (MERREM) IV  1 g Intravenous Q8H  . rosuvastatin  10 mg Oral Daily  . vancomycin  750 mg  Intravenous Q12H    Objective: Vital signs in last 24 hours: Temp:  [99.1 F (37.3 C)-100.1 F (37.8 C)] 100.1 F (37.8 C) (09/23 0607) Pulse Rate:  [56-66] 56 (09/23 0607) Resp:  [16-20] 20 (09/23 0607) BP: (134)/(60-79) 134/79 (09/23 0607) SpO2:  [98 %-100 %] 100 % (09/23 0607) Weight:  [69.8 kg (153 lb 14.1 oz)] 69.8 kg (153 lb 14.1 oz) (09/23 16100607)   General appearance: alert, cooperative and no distress Resp: clear to auscultation bilaterally Cardio: regular rate and rhythm GI: normal findings: bowel sounds normal and soft, non-tender  Lab Results  Recent Labs  03/12/16 0515  03/12/16 2313 03/14/16 0353  WBC 7.5  --   --   --   HGB 9.5*  --   --   --   HCT 29.8*  --   --   --   NA 139  --   --  141  K 2.8*  < > 3.2* 3.4*  CL 107  --   --  104  CO2 22  --   --  26  BUN 6  --   --  <5*  CREATININE 0.86  --   --  0.73  < > =  values in this interval not displayed. Liver Panel No results for input(s): PROT, ALBUMIN, AST, ALT, ALKPHOS, BILITOT, BILIDIR, IBILI in the last 72 hours. Sedimentation Rate No results for input(s): ESRSEDRATE in the last 72 hours. C-Reactive Protein No results for input(s): CRP in the last 72 hours.  Microbiology: Recent Results (from the past 240 hour(s))  Culture, blood (Routine x 2)     Status: None   Collection Time: 03/08/16 12:22 PM  Result Value Ref Range Status   Specimen Description BLOOD LEFT WRIST  Final   Special Requests BOTTLES DRAWN AEROBIC AND ANAEROBIC  5CC  Final   Culture NO GROWTH 5 DAYS  Final   Report Status 03/13/2016 FINAL  Final  Culture, blood (Routine x 2)     Status: None   Collection Time: 03/08/16 12:25 PM  Result Value Ref Range Status   Specimen Description BLOOD RIGHT WRIST  Final   Special Requests IN PEDIATRIC BOTTLE  3CC  Final   Culture NO GROWTH 5 DAYS  Final   Report Status 03/13/2016 FINAL  Final  Urine culture     Status: None   Collection Time: 03/08/16  2:52 PM  Result Value Ref Range  Status   Specimen Description URINE, RANDOM  Final   Special Requests NONE  Final   Culture NO GROWTH  Final   Report Status 03/09/2016 FINAL  Final  Stool culture (children & immunocomp patients)     Status: None   Collection Time: 03/08/16  9:14 PM  Result Value Ref Range Status   Salmonella/Shigella Screen Final report  Final   Campylobacter Culture Final report  Final   E coli, Shiga toxin Assay Negative Negative Final    Comment: (NOTE) Performed At: [facility] [address] [place], [place] [id number] Mila Homer MD [id number]   STOOL CULTURE REFLEX - RSASHR     Status: None   Collection Time: 03/08/16  9:14 PM  Result Value Ref Range Status   Stool Culture result 1 (RSASHR) Comment  Final    Comment: (NOTE) No Salmonella or Shigella recovered. Performed At: [facility] [address] [place], [place] [id number] Mila Homer MD VQ:[id number]   STOOL CULTURE Reflex - CMPCXR     Status: None   Collection Time: 03/08/16  9:14 PM  Result Value Ref Range Status   Stool Culture result 1 (CMPCXR) Comment  Final    Comment: (NOTE) No Campylobacter species isolated. Performed At: [facility] [address] [place], [place] [id number] Mila Homer MD CZ:[id number]   Culture, blood (routine x 2)     Status: None (Preliminary result)   Collection Time: 03/09/16  6:35 AM  Result Value Ref Range Status   Specimen Description BLOOD LEFT ARM  Final   Special Requests BOTTLES DRAWN AEROBIC AND ANAEROBIC 5CC  Final   Culture NO GROWTH 4 DAYS  Final   Report Status PENDING  Incomplete  Culture, blood (routine x 2)     Status: None (Preliminary result)   Collection Time: 03/09/16  6:54 AM  Result Value Ref Range Status   Specimen Description BLOOD LEFT HAND  Final   Special Requests BOTTLES DRAWN AEROBIC AND ANAEROBIC 5CC  Final   Culture NO GROWTH 4 DAYS  Final   Report Status PENDING  Incomplete    Studies/Results: Mr Latasha Wang  Contrast  Result Date: 03/12/2016 CLINICAL DATA:  75 y.o. female admitted on 03/08/2016 with rule out meningitis - patient has fever of  unknown origin and possible dental space infection. Patient is agitated and confused with neck pain EXAM: MRI HEAD WITHOUT AND WITH CONTRAST TECHNIQUE: Multiplanar, multiecho pulse sequences of the brain and surrounding structures were obtained without and with intravenous contrast. CONTRAST:  15mL MULTIHANCE GADOBENATE DIMEGLUMINE 529 MG/ML IV SOLN COMPARISON:  CT head 03/09/2016. FINDINGS: The patient was unable to remain motionless for the exam. Small or subtle lesions could be overlooked. Brain: No evidence for acute infarction, hemorrhage, mass lesion, hydrocephalus, or extra-axial fluid. Mild atrophy. Minor white matter disease, nonspecific. Post infusion, no abnormal enhancement of the brain or meninges. Vascular: Normal flow voids. Skull and upper cervical spine: Partial empty sella. No midline abnormality. Sinuses/Orbits: No orbital masses or proptosis. Globes appear symmetric. Sinuses appear well aerated, without evidence for air-fluid level. Other: No nasopharyngeal pathology or mastoid fluid. Scalp and other visualized extracranial soft tissues grossly unremarkable. IMPRESSION: Motion degraded examination demonstrating atrophy without acute intracranial findings. No abnormal postcontrast enhancement or restricted diffusion to suggest intracranial infection/inflammatory process. No significant sinus disease or parameningeal focus of infection. Electronically Signed   By: Elsie Stain M.D.   On: 03/12/2016 17:59     Assessment/Plan: Fever  Sepsis Acute encephalopathy Possible Dental Space Infection DM HTN AKI Total days of antibiotics: 3 vanco/merrem  Afebrile since 9-20 Will stop vanco If she continues to do well, will stop merrem tomorrow and transition to augmentin.    BC x 2 9/17 negative BC x 2 9/18 ngtd UC negative Stool culture 9/17  negative         Johny Sax Infectious Diseases (pager) [phone] [email or web address] 03/14/2016, 2:00 PM  LOS: 5 days

## 2016-03-14 NOTE — Progress Notes (Signed)
PROGRESS NOTE    Latasha Wang  GNF:[id number] DOB: 1940/07/05 DOA: 03/08/2016 PCP: Oliver Barre, MD     Brief Narrative:  75 y.o. AF with medical history significant for diabetes, hypertension, fatty liver disease, depression, anxiety recent extraction of 14 teeth presents to the emergency department with chief complaint of fever. Initial evaluation reveals a temperature of 103.2 orally, elevated lactic acid, hyponatremia, acute kidney injury and acute encephalopathy.  Information is obtained from the patient and the daughter who is at the bedside. 2 days ago she had 14 teeth extracted anticipation of dentures. Daughter states done yesterday reported that "a couple of teeth had decay in a couple had abscesses". She was sent home with prednisone taper which is completed and amoxicillin. Over the last 2 days she became gradually more lethargic and fevers. This morning patient was unable to bear weight became incontinent of urine. She is symptoms include decreased oral intake over the last 2 days complaints of low back pain. He has not had any pain medicine for 2 days. She has been taken her antibiotics. No complaints of headache dizziness syncope or near-syncope. No complaints of chest pain palpitation shortness of breath. No cough no dysuria hematuria frequency or urgency. No diarrhea abdominal pain nausea vomiting.  Subjective: Pt has no new complaints. No acute issues overnight.   Assessment & Plan:   Principal Problem:   Acute encephalopathy Active Problems:   Diabetes (HCC)   Anxiety state   Essential hypertension   GERD   Fever   Acute kidney injury (HCC)   Hyponatremia   Elevated lactic acid level   FUO (fever of unknown origin)   Controlled diabetes mellitus type 2 with complications (HCC)   Acute encephalopathy  -no fevers in the last 24 hours   FUO  - Suspecting possible dental space infection. No more fevers. ID has evaluated and recommended the following: Afebrile  since 9-20 Will stop vanco If she continues to do well, will stop merrem tomorrow and transition to augmentin.    Acute kidney injury. -Resolved   Hyponatremia.  -Resolved   Hypertension.  -within normal limits off antihypertensives  Diabetes Type 2 controlled with complication.  -Patient is on oral agents.  -9/17 Hemoglobin A1c= 6.3 -Sensitive SSI  Anxiety/Depression.  - Wellbutrin 150 mg daily -Clonazepam 0.5 mg BID -Prozac 40 mg daily  Status post teeth extraction.  -Dr. Kristin Bruins consulted appears on 9/20. Awaiting recommendations   Hypokalemia -potassium still low will replace orally and reassess next am.  Hypomagnesemia -wnl after replacement  DVT prophylaxis: Lovenox Code Status: Full Family Communication: Spoke with patient Disposition Plan: With continued improvement d/c in the next 1-2 days   Consultants:  Dr.Jeffrey C Hatcher ID  Procedures/Significant Events:  9/17 CT maxillofacial W contrast: Negative abscess 9/18 CT head: Negative abnormality 9/21 MRI brain; negative acute infarct  Cultures 9/17 blood left/right wrist NGTD 9/17 urine negative 9/17 stool in NGTD 9/18 blood left arm/hand NGTD   Antimicrobials: Amoxicillin 9/17>> 9/18 (note was on amoxicillin as outpatient also) Unasyn 9/19>> 9/20 Zosyn 9/18>> 9/19 Meropenem 9/20>> Vancomycin 9/18>>  Devices None   LINES / TUBES:  None  Continuous Infusions: . sodium chloride 1,000 mL (03/14/16 0812)     Objective: Vitals:   03/13/16 1342 03/13/16 2132 03/14/16 0607 03/14/16 1419  BP: 140/60 134/60 134/79 140/60  Pulse: 64 66 (!) 56 70  Resp: 14 16 20    Temp: 99.6 F (37.6 C) 99.1 F (37.3 C) 100.1 F (37.8 C) 99.6 F (  37.6 C)  TempSrc: Oral Oral Oral Oral  SpO2: 98% 98% 100% 99%  Weight:   69.8 kg (153 lb 14.1 oz)   Height:        Intake/Output Summary (Last 24 hours) at 03/14/16 1452 Last data filed at 03/14/16 1215  Gross per 24 hour  Intake           2406.25 ml  Output             2702 ml  Net          -295.75 ml   Filed Weights   03/08/16 1207 03/09/16 0402 03/14/16 0607  Weight: 77.1 kg (170 lb) 75.7 kg (166 lb 14.2 oz) 69.8 kg (153 lb 14.1 oz)    Examination:  General: Sleepy but arousable, A/O 4, NAD, No acute respiratory distress Eyes: negative scleral hemorrhage, negative anisocoria, negative icterus ENT: Negative Runny nose, negative gingival bleeding, Neck:  Negative scars, masses, torticollis, lymphadenopathy, JVD Lungs: Clear to auscultation bilaterally without wheezes or crackles Cardiovascular: Regular rate and rhythm without murmur gallop or rub normal S1 and S2 Abdomen: negative abdominal pain, nondistended, positive soft, bowel sounds, no rebound, no ascites, no appreciable mass Extremities: No significant cyanosis, clubbing, or edema bilateral lower extremities Skin: Negative rashes, lesions, ulcers Psychiatric:  Negative depression, negative anxiety, negative fatigue, negative mania  Central nervous system:  No facial asymmetry, tongue/uvula midline, all extremities muscle strength 5/5, sensation intact throughout,negative dysarthria, negative expressive aphasia, negative receptive aphasia.  .     Data Reviewed: Care during the described time interval was provided by me .  I have reviewed this patient's available data, including medical history, events of note, physical examination, and all test results as part of my evaluation. I have personally reviewed and interpreted all radiology studies.  CBC:  Recent Labs Lab 03/08/16 1222 03/09/16 0639 03/10/16 0443 03/11/16 0630 03/12/16 0515  WBC 9.1 9.9 8.3 8.0 7.5  NEUTROABS 6.5  --   --   --   --   HGB 11.2* 9.9* 10.1* 9.9* 9.5*  HCT 35.0* 30.6* 32.3* 30.5* 29.8*  MCV 90.0 88.7 89.5 88.9 89.0  PLT 196 175 180 182 161   Basic Metabolic Panel:  Recent Labs Lab 03/09/16 0639 03/10/16 0443 03/11/16 0630 03/12/16 0515 03/12/16 1427 03/12/16 2313  03/14/16 0353  NA 136 138 138 139  --   --  141  K 3.5 3.2* 3.0* 2.8* 2.8* 3.2* 3.4*  CL 106 106 108 107  --   --  104  CO2 19* 23 22 22   --   --  26  GLUCOSE 120* 121* 99 109*  --   --  109*  BUN 15 9 <5* 6  --   --  <5*  CREATININE 1.47* 1.02* 0.81 0.86  --   --  0.73  CALCIUM 8.2* 8.2* 8.2* 8.0*  --   --  8.8*  MG  --   --   --   --  1.9  --   --    GFR: Estimated Creatinine Clearance (by C-G formula based on SCr of 0.73 mg/dL) Female: 40.9 mL/min Female: 61.6 mL/min Liver Function Tests:  Recent Labs Lab 03/08/16 1222  AST 22  ALT 18  ALKPHOS 44  BILITOT 2.1*  PROT 7.3  ALBUMIN 3.8   No results for input(s): LIPASE, AMYLASE in the last 168 hours. No results for input(s): AMMONIA in the last 168 hours. Coagulation Profile: No results for input(s): INR, PROTIME in the last  168 hours. Cardiac Enzymes: No results for input(s): CKTOTAL, CKMB, CKMBINDEX, TROPONINI in the last 168 hours. BNP (last 3 results) No results for input(s): PROBNP in the last 8760 hours. HbA1C: No results for input(s): HGBA1C in the last 72 hours. CBG:  Recent Labs Lab 03/13/16 1201 03/13/16 1659 03/13/16 2247 03/14/16 0829 03/14/16 1203  GLUCAP 108* 156* 147* 109* 127*   Lipid Profile: No results for input(s): CHOL, HDL, LDLCALC, TRIG, CHOLHDL, LDLDIRECT in the last 72 hours. Thyroid Function Tests: No results for input(s): TSH, T4TOTAL, FREET4, T3FREE, THYROIDAB in the last 72 hours. Anemia Panel: No results for input(s): VITAMINB12, FOLATE, FERRITIN, TIBC, IRON, RETICCTPCT in the last 72 hours. Sepsis Labs:  Recent Labs Lab 03/08/16 1256 03/08/16 1602 03/08/16 1720  LATICACIDVEN 2.02* 1.00 1.5    Recent Results (from the past 240 hour(s))  Culture, blood (Routine x 2)     Status: None   Collection Time: 03/08/16 12:22 PM  Result Value Ref Range Status   Specimen Description BLOOD LEFT WRIST  Final   Special Requests BOTTLES DRAWN AEROBIC AND ANAEROBIC  5CC  Final   Culture  NO GROWTH 5 DAYS  Final   Report Status 03/13/2016 FINAL  Final  Culture, blood (Routine x 2)     Status: None   Collection Time: 03/08/16 12:25 PM  Result Value Ref Range Status   Specimen Description BLOOD RIGHT WRIST  Final   Special Requests IN PEDIATRIC BOTTLE  3CC  Final   Culture NO GROWTH 5 DAYS  Final   Report Status 03/13/2016 FINAL  Final  Urine culture     Status: None   Collection Time: 03/08/16  2:52 PM  Result Value Ref Range Status   Specimen Description URINE, RANDOM  Final   Special Requests NONE  Final   Culture NO GROWTH  Final   Report Status 03/09/2016 FINAL  Final  Stool culture (children & immunocomp patients)     Status: None   Collection Time: 03/08/16  9:14 PM  Result Value Ref Range Status   Salmonella/Shigella Screen Final report  Final   Campylobacter Culture Final report  Final   E coli, Shiga toxin Assay Negative Negative Final    Comment: (NOTE) Performed At: [facility] [address] [place], [place] [id number] Mila Homer MD [id number]   STOOL CULTURE REFLEX - RSASHR     Status: None   Collection Time: 03/08/16  9:14 PM  Result Value Ref Range Status   Stool Culture result 1 (RSASHR) Comment  Final    Comment: (NOTE) No Salmonella or Shigella recovered. Performed At: [facility] [address] [place], [place] [id number] Mila Homer MD [id number]   STOOL CULTURE Reflex - CMPCXR     Status: None   Collection Time: 03/08/16  9:14 PM  Result Value Ref Range Status   Stool Culture result 1 (CMPCXR) Comment  Final    Comment: (NOTE) No Campylobacter species isolated. Performed At: [facility] [address] [place], [place] [id number] Mila Homer MD IH:[id number]   Culture, blood (routine x 2)     Status: None (Preliminary result)   Collection Time: 03/09/16  6:35 AM  Result Value Ref Range Status   Specimen Description BLOOD LEFT ARM  Final   Special Requests BOTTLES DRAWN AEROBIC AND  ANAEROBIC 5CC  Final   Culture NO GROWTH 4 DAYS  Final   Report Status PENDING  Incomplete  Culture, blood (routine x 2)  Status: None (Preliminary result)   Collection Time: 03/09/16  6:54 AM  Result Value Ref Range Status   Specimen Description BLOOD LEFT HAND  Final   Special Requests BOTTLES DRAWN AEROBIC AND ANAEROBIC 5CC  Final   Culture NO GROWTH 4 DAYS  Final   Report Status PENDING  Incomplete         Radiology Studies: Mr Laqueta JeanBrain W Wo Contrast  Result Date: 03/12/2016 CLINICAL DATA:  75 y.o. female admitted on 03/08/2016 with rule out meningitis - patient has fever of unknown origin and possible dental space infection. Patient is agitated and confused with neck pain EXAM: MRI HEAD WITHOUT AND WITH CONTRAST TECHNIQUE: Multiplanar, multiecho pulse sequences of the brain and surrounding structures were obtained without and with intravenous contrast. CONTRAST:  15mL MULTIHANCE GADOBENATE DIMEGLUMINE 529 MG/ML IV SOLN COMPARISON:  CT head 03/09/2016. FINDINGS: The patient was unable to remain motionless for the exam. Small or subtle lesions could be overlooked. Brain: No evidence for acute infarction, hemorrhage, mass lesion, hydrocephalus, or extra-axial fluid. Mild atrophy. Minor white matter disease, nonspecific. Post infusion, no abnormal enhancement of the brain or meninges. Vascular: Normal flow voids. Skull and upper cervical spine: Partial empty sella. No midline abnormality. Sinuses/Orbits: No orbital masses or proptosis. Globes appear symmetric. Sinuses appear well aerated, without evidence for air-fluid level. Other: No nasopharyngeal pathology or mastoid fluid. Scalp and other visualized extracranial soft tissues grossly unremarkable. IMPRESSION: Motion degraded examination demonstrating atrophy without acute intracranial findings. No abnormal postcontrast enhancement or restricted diffusion to suggest intracranial infection/inflammatory process. No significant sinus disease or  parameningeal focus of infection. Electronically Signed   By: Elsie StainJohn T Curnes M.D.   On: 03/12/2016 17:59        Scheduled Meds: . buPROPion  150 mg Oral Daily  . clonazePAM  0.5 mg Oral BID  . enoxaparin (LOVENOX) injection  40 mg Subcutaneous Q24H  . FLUoxetine  40 mg Oral QHS  . insulin aspart  0-5 Units Subcutaneous QHS  . insulin aspart  0-9 Units Subcutaneous TID WC  . meropenem (MERREM) IV  1 g Intravenous Q8H  . rosuvastatin  10 mg Oral Daily  . vancomycin  750 mg Intravenous Q12H   Continuous Infusions: . sodium chloride 1,000 mL (03/14/16 0812)     LOS: 5 days    Time spent:35 min  Penny PiaVEGA, Rayden Dock, MD [facility] Pager [phone]  If 7PM-7AM, please contact night-coverage [email or web address] Password [email or web address] 03/14/2016, 2:52 PM

## 2016-03-15 ENCOUNTER — Other Ambulatory Visit: Payer: Self-pay | Admitting: Internal Medicine

## 2016-03-15 DIAGNOSIS — R509 Fever, unspecified: Secondary | ICD-10-CM

## 2016-03-15 LAB — QUANTIFERON IN TUBE
QFT TB AG MINUS NIL VALUE: 0.18 IU/mL
QUANTIFERON MITOGEN VALUE: 7.91 [IU]/mL
QUANTIFERON TB AG VALUE: 0.34 [IU]/mL
QUANTIFERON TB GOLD: NEGATIVE
Quantiferon Nil Value: 0.16 IU/mL

## 2016-03-15 LAB — GLUCOSE, CAPILLARY
GLUCOSE-CAPILLARY: 128 mg/dL — AB (ref 65–99)
GLUCOSE-CAPILLARY: 138 mg/dL — AB (ref 65–99)
Glucose-Capillary: 113 mg/dL — ABNORMAL HIGH (ref 65–99)
Glucose-Capillary: 122 mg/dL — ABNORMAL HIGH (ref 65–99)

## 2016-03-15 LAB — QUANTIFERON TB GOLD ASSAY (BLOOD)

## 2016-03-15 LAB — CREATININE, SERUM
Creatinine, Ser: 0.68 mg/dL (ref 0.44–1.00)
GFR calc non Af Amer: >60 mL/min (ref >60)

## 2016-03-15 MED ORDER — AMOXICILLIN-POT CLAVULANATE 875-125 MG PO TABS
1.0000 | ORAL_TABLET | Freq: Two times a day (BID) | ORAL | Status: DC
  Administered 2016-03-15 – 2016-03-16 (×2): 1 via ORAL
  Filled 2016-03-15: qty 1

## 2016-03-15 NOTE — Progress Notes (Signed)
Patient ID: Latasha Wang, female   DOB: May 06, 1941, 75 y.o.   MRN: [id number]                                                                PROGRESS NOTE                                                                                                                                                                                                             Patient Demographics:    Latasha Wang, is a 75 y.o. female, DOB - 1940-11-23, ONG:[id number]  Admit date - 03/08/2016   Admitting Physician Drema Dallas, MD  Outpatient Primary MD for the patient is Oliver Barre, MD  LOS - 6  Outpatient Specialists:    Chief Complaint  Patient presents with  . Fever       Brief Narrative  75 y.o.AFwith medical history significant for diabetes, hypertension, fatty liver disease, depression, anxiety recent extraction of 14 teeth presents to the emergency department with chief complaint of fever. Initial evaluation reveals a temperature of 103.2 orally, elevated lactic acid, hyponatremia, acute kidney injury and acute encephalopathy.  Information is obtained from the patient and the daughter who is at the bedside. 2 days ago she had 14 teeth extracted anticipation of dentures. Daughter states done yesterday reported that "a couple of teeth had decay in a couple had abscesses". She was sent home with prednisone taper which is completed and amoxicillin. Over the last 2 days she became gradually more lethargic and fevers. This morning patient was unable to bear weight became incontinent of urine. She is symptoms include decreased oral intake over the last 2 days complaints of low back pain. He has not had any pain medicine for 2 days. She has been taken her antibiotics. No complaints of headache dizziness syncope or near-syncope. No complaints of chest pain palpitation shortness of breath. No cough no dysuria hematuria frequency or urgency. No diarrhea abdominal pain nausea vomiting.   Subjective:    Latasha  Wang today has been feeling better,  Afebrile overnite.  Denies cough, cp, palp, sob, n/v, diarrhea, dysuria, hematuria.  Being followed by ID, appreciate their input   No headache,No abdominal pain - No Nausea, No new weakness tingling or numbness.   Assessment  & Plan :  Principal Problem:   Acute encephalopathy Active Problems:   Diabetes (HCC)   Anxiety state   Essential hypertension   GERD   Fever   Acute kidney injury (HCC)   Hyponatremia   Elevated lactic acid level   FUO (fever of unknown origin)   Controlled diabetes mellitus type 2 with complications (HCC)   Acute encephalopathy  -no fevers in the last 24 hours Pt is axox3, today appears resolved   FUO  - Suspecting possible dental space infection. No more fevers. ID has evaluated and recommended the following: Afebrile since 9-20, cardiac echo 9/19=> negative for endocarditis Blood culture negative Stop Vanco 9/24 If she continues to do well, will stop merrem and transition to augmentin.  check cbc in am  Acute kidney injury. -Resolved   Hyponatremia.  -Resolved   Hypertension.  -within normal limits off antihypertensives  Diabetes Type 2controlled with complication.  -Patient is on oral agents.  -9/17 Hemoglobin A1c= 6.3 -Sensitive SSI  Anxiety/Depression.  - Wellbutrin 150 mg daily -Clonazepam 0.5 mg BID -Prozac 40 mg daily  Status post teeth extraction.  -Dr. Kristin Bruins consulted appears on 9/20. Awaiting recommendations   Hypokalemia -resolved Check cmp in am  Hypomagnesemia -wnl after replacement  DVT prophylaxis: Lovenox Code Status: Full Family Communication: Spoke with patient Disposition Plan:  if afebrile on augmentin d/c home tomorrow.    Consultants:  Prince Rome ID  Procedures/Significant Events:  9/17 CT maxillofacial W contrast: Negative abscess 9/18 CT head: Negative abnormality 9/21 MRI brain; negative acute infarct  Cultures 9/17  blood left/right wrist NGTD 9/17 urine negative 9/17 stool in NGTD 9/18 blood left arm/hand NGTD   Antimicrobials: Amoxicillin 9/17>> 9/18 (note was on amoxicillin as outpatient also) Unasyn 9/19>> 9/20 Zosyn 9/18>> 9/19 Meropenem 9/20>> Vancomycin 9/18>>  Devices None      LINES / TUBES:  None     Lab Results  Component Value Date   PLT 161 03/12/2016     Anti-infectives    Start     Dose/Rate Route Frequency Ordered Stop   03/11/16 1715  vancomycin (VANCOCIN) IVPB 750 mg/150 ml premix     750 mg 150 mL/hr over 60 Minutes Intravenous Every 12 hours 03/11/16 1703     03/11/16 1715  meropenem (MERREM) 1 g in sodium chloride 0.9 % 100 mL IVPB     1 g 200 mL/hr over 30 Minutes Intravenous Every 8 hours 03/11/16 1703     03/10/16 1800  Ampicillin-Sulbactam (UNASYN) 3 g in sodium chloride 0.9 % 100 mL IVPB  Status:  Discontinued     3 g 100 mL/hr over 60 Minutes Intravenous Every 8 hours 03/10/16 1634 03/11/16 1702   03/09/16 1000  vancomycin (VANCOCIN) IVPB 1000 mg/200 mL premix  Status:  Discontinued     1,000 mg 200 mL/hr over 60 Minutes Intravenous Every 24 hours 03/09/16 0923 03/10/16 1634   03/09/16 0930  piperacillin-tazobactam (ZOSYN) IVPB 3.375 g  Status:  Discontinued     3.375 g 12.5 mL/hr over 240 Minutes Intravenous Every 8 hours 03/09/16 0908 03/10/16 1634   03/08/16 1715  amoxicillin (AMOXIL) capsule 500 mg  Status:  Discontinued     500 mg Oral Every 8 hours 03/08/16 1712 03/09/16 0900        Objective:   Vitals:   03/14/16 0607 03/14/16 1419 03/14/16 2056 03/15/16 0645  BP: 134/79 140/60 (!) 154/71 (!) 156/66  Pulse: (!) 56 70 63 65  Resp: 20  18 20   Temp: 100.1  F (37.8 C) 99.6 F (37.6 C) 98.9 F (37.2 C) 98.2 F (36.8 C)  TempSrc: Oral Oral Oral Oral  SpO2: 100% 99% 99% 100%  Weight: 69.8 kg (153 lb 14.1 oz)     Height:        Wt Readings from Last 3 Encounters:  03/14/16 69.8 kg (153 lb 14.1 oz)  12/12/15 72.6 kg (160 lb)    06/13/15 69.4 kg (153 lb)     Intake/Output Summary (Last 24 hours) at 03/15/16 1124 Last data filed at 03/15/16 0930  Gross per 24 hour  Intake             2345 ml  Output             2975 ml  Net             -630 ml     Physical Exam  Awake Alert, Oriented X 3, No new F.N deficits, Normal affect [place].AT,PERRAL Supple Neck,No JVD, No cervical lymphadenopathy appriciated.  Symmetrical Chest wall movement, Good air movement bilaterally, CTAB RRR,No Gallops,Rubs or new Murmurs, No Parasternal Heave +ve B.Sounds, Abd Soft, No tenderness, No organomegaly appriciated, No rebound - guarding or rigidity. No Cyanosis, Clubbing or edema, No new Rash or bruise   No janeway, no osler, no splinter    Data Review:    CBC  Recent Labs Lab 03/08/16 1222 03/09/16 0639 03/10/16 0443 03/11/16 0630 03/12/16 0515  WBC 9.1 9.9 8.3 8.0 7.5  HGB 11.2* 9.9* 10.1* 9.9* 9.5*  HCT 35.0* 30.6* 32.3* 30.5* 29.8*  PLT 196 175 180 182 161  MCV 90.0 88.7 89.5 88.9 89.0  MCH 28.8 28.7 28.0 28.9 28.4  MCHC 32.0 32.4 31.3 32.5 31.9  RDW 14.6 14.7 14.7 14.8 14.8  LYMPHSABS 1.9  --   --   --   --   MONOABS 0.8  --   --   --   --   EOSABS 0.0  --   --   --   --   BASOSABS 0.0  --   --   --   --     Chemistries   Recent Labs Lab 03/08/16 1222 03/09/16 0639 03/10/16 0443 03/11/16 0630 03/12/16 0515 03/12/16 1427 03/12/16 2313 03/14/16 0353 03/15/16 0520  NA 131* 136 138 138 139  --   --  141  --   K 3.7 3.5 3.2* 3.0* 2.8* 2.8* 3.2* 3.4*  --   CL 100* 106 106 108 107  --   --  104  --   CO2 23 19* 23 22 22   --   --  26  --   GLUCOSE 115* 120* 121* 99 109*  --   --  109*  --   BUN 16 15 9  <5* 6  --   --  <5*  --   CREATININE 1.16* 1.47* 1.02* 0.81 0.86  --   --  0.73 0.68  CALCIUM 8.7* 8.2* 8.2* 8.2* 8.0*  --   --  8.8*  --   MG  --   --   --   --   --  1.9  --   --   --   AST 22  --   --   --   --   --   --   --   --   ALT 18  --   --   --   --   --   --   --   --  ALKPHOS 44  --    --   --   --   --   --   --   --   BILITOT 2.1*  --   --   --   --   --   --   --   --    ------------------------------------------------------------------------------------------------------------------ No results for input(s): CHOL, HDL, LDLCALC, TRIG, CHOLHDL, LDLDIRECT in the last 72 hours.  Lab Results  Component Value Date   HGBA1C 6.3 (H) 03/08/2016   ------------------------------------------------------------------------------------------------------------------ No results for input(s): TSH, T4TOTAL, T3FREE, THYROIDAB in the last 72 hours.  Invalid input(s): FREET3 ------------------------------------------------------------------------------------------------------------------ No results for input(s): VITAMINB12, FOLATE, FERRITIN, TIBC, IRON, RETICCTPCT in the last 72 hours.  Coagulation profile No results for input(s): INR, PROTIME in the last 168 hours.  No results for input(s): DDIMER in the last 72 hours.  Cardiac Enzymes No results for input(s): CKMB, TROPONINI, MYOGLOBIN in the last 168 hours.  Invalid input(s): CK ------------------------------------------------------------------------------------------------------------------ No results found for: BNP  Inpatient Medications  Scheduled Meds: . buPROPion  150 mg Oral Daily  . clonazePAM  0.5 mg Oral BID  . enoxaparin (LOVENOX) injection  40 mg Subcutaneous Q24H  . FLUoxetine  40 mg Oral QHS  . insulin aspart  0-5 Units Subcutaneous QHS  . insulin aspart  0-9 Units Subcutaneous TID WC  . meropenem (MERREM) IV  1 g Intravenous Q8H  . rosuvastatin  10 mg Oral Daily  . vancomycin  750 mg Intravenous Q12H   Continuous Infusions: . sodium chloride 75 mL/hr at 03/15/16 0023   PRN Meds:.acetaminophen **OR** acetaminophen, albuterol, ibuprofen, ondansetron **OR** ondansetron (ZOFRAN) IV, polyethylene glycol  Micro Results Recent Results (from the past 240 hour(s))  Culture, blood (Routine x 2)     Status: None    Collection Time: 03/08/16 12:22 PM  Result Value Ref Range Status   Specimen Description BLOOD LEFT WRIST  Final   Special Requests BOTTLES DRAWN AEROBIC AND ANAEROBIC  5CC  Final   Culture NO GROWTH 5 DAYS  Final   Report Status 03/13/2016 FINAL  Final  Culture, blood (Routine x 2)     Status: None   Collection Time: 03/08/16 12:25 PM  Result Value Ref Range Status   Specimen Description BLOOD RIGHT WRIST  Final   Special Requests IN PEDIATRIC BOTTLE  3CC  Final   Culture NO GROWTH 5 DAYS  Final   Report Status 03/13/2016 FINAL  Final  Urine culture     Status: None   Collection Time: 03/08/16  2:52 PM  Result Value Ref Range Status   Specimen Description URINE, RANDOM  Final   Special Requests NONE  Final   Culture NO GROWTH  Final   Report Status 03/09/2016 FINAL  Final  Stool culture (children & immunocomp patients)     Status: None   Collection Time: 03/08/16  9:14 PM  Result Value Ref Range Status   Salmonella/Shigella Screen Final report  Final   Campylobacter Culture Final report  Final   E coli, Shiga toxin Assay Negative Negative Final    Comment: (NOTE) Performed At: [facility] [address] [place], [place] [id number] Mila Homer MD JY:[id number]   STOOL CULTURE REFLEX - RSASHR     Status: None   Collection Time: 03/08/16  9:14 PM  Result Value Ref Range Status   Stool Culture result 1 (RSASHR) Comment  Final    Comment: (NOTE) No Salmonella or Shigella recovered. Performed At: [facility] [address]  [address] [place], [place] [id number] Mila Homer MD WU:[id number]   STOOL CULTURE Reflex - CMPCXR     Status: None   Collection Time: 03/08/16  9:14 PM  Result Value Ref Range Status   Stool Culture result 1 (CMPCXR) Comment  Final    Comment: (NOTE) No Campylobacter species isolated. Performed At: [facility] [address] [place], [place] [id number] Mila Homer MD VH:[id number]   Culture, blood (routine x 2)      Status: None   Collection Time: 03/09/16  6:35 AM  Result Value Ref Range Status   Specimen Description BLOOD LEFT ARM  Final   Special Requests BOTTLES DRAWN AEROBIC AND ANAEROBIC 5CC  Final   Culture NO GROWTH 5 DAYS  Final   Report Status 03/14/2016 FINAL  Final  Culture, blood (routine x 2)     Status: None   Collection Time: 03/09/16  6:54 AM  Result Value Ref Range Status   Specimen Description BLOOD LEFT HAND  Final   Special Requests BOTTLES DRAWN AEROBIC AND ANAEROBIC 5CC  Final   Culture NO GROWTH 5 DAYS  Final   Report Status 03/14/2016 FINAL  Final    Radiology Reports Dg Chest 2 View  Result Date: 03/11/2016 CLINICAL DATA:  Cough.  Hypertension. EXAM: CHEST  2 VIEW COMPARISON:  March 09, 2016 FINDINGS: There is no edema or consolidation. Heart is mildly enlarged with pulmonary vascularity within normal limits. There is atherosclerotic calcification in the aorta. No adenopathy. There is evidence of old trauma involving the proximal left humerus, stable. Patient has had elbow replacement on the left. IMPRESSION: No edema or consolidation. Mild cardiomegaly. Aortic atherosclerosis. Electronically Signed   By: Bretta Bang III M.D.   On: 03/11/2016 10:24   Dg Chest 2 View  Result Date: 03/08/2016 CLINICAL DATA:  Chest pain for 2 years, hypertension, diabetes mellitus EXAM: CHEST  2 VIEW COMPARISON:  08/11/2012 FINDINGS: Enlargement of cardiac silhouette. Calcified tortuous aorta. Pulmonary vascularity and mediastinal contours otherwise normal. Minimal RIGHT basilar atelectasis. Lungs otherwise clear. No pleural effusion or pneumothorax. Diffuse osseous demineralization. IMPRESSION: Enlargement of cardiac silhouette. Minimal RIGHT basilar atelectasis. Aortic atherosclerosis. Electronically Signed   By: Ulyses Southward M.D.   On: 03/08/2016 13:19   Ct Head Wo Contrast  Result Date: 03/09/2016 CLINICAL DATA:  Fever have recent dental extractions possible sepsis EXAM: CT HEAD  WITHOUT CONTRAST TECHNIQUE: Contiguous axial images were obtained from the base of the skull through the vertex without intravenous contrast. COMPARISON:  CT brain scan of 10/30/2011 FINDINGS: Brain: The ventricular system is unchanged in size and configuration compared to the prior CT, and there is only mild cortical atrophy present. The septum remains midline in position. The fourth ventricle and basilar cisterns are unchanged. Benign-appearing basal ganglial calcifications are present bilaterally. No hemorrhage, mass lesion, or acute infarction is seen. Vascular: On this unenhanced study, no vascular abnormality is noted. Skull: On bone window images, no calvarial abnormality is seen. Sinuses/Orbits: The paranasal sinuses appear well pneumatized. No sinusitis is noted. Other: None IMPRESSION: No acute intracranial abnormality.  Minimal atrophy for age Electronically Signed   By: Dwyane Dee M.D.   On: 03/09/2016 11:34   Mr Laqueta Jean UX Contrast  Result Date: 03/12/2016 CLINICAL DATA:  75 y.o. female admitted on 03/08/2016 with rule out meningitis - patient has fever of unknown origin and possible dental space infection. Patient is agitated and confused with neck pain EXAM: MRI HEAD WITHOUT AND WITH CONTRAST TECHNIQUE: Multiplanar, multiecho  pulse sequences of the brain and surrounding structures were obtained without and with intravenous contrast. CONTRAST:  15mL MULTIHANCE GADOBENATE DIMEGLUMINE 529 MG/ML IV SOLN COMPARISON:  CT head 03/09/2016. FINDINGS: The patient was unable to remain motionless for the exam. Small or subtle lesions could be overlooked. Brain: No evidence for acute infarction, hemorrhage, mass lesion, hydrocephalus, or extra-axial fluid. Mild atrophy. Minor white matter disease, nonspecific. Post infusion, no abnormal enhancement of the brain or meninges. Vascular: Normal flow voids. Skull and upper cervical spine: Partial empty sella. No midline abnormality. Sinuses/Orbits: No orbital  masses or proptosis. Globes appear symmetric. Sinuses appear well aerated, without evidence for air-fluid level. Other: No nasopharyngeal pathology or mastoid fluid. Scalp and other visualized extracranial soft tissues grossly unremarkable. IMPRESSION: Motion degraded examination demonstrating atrophy without acute intracranial findings. No abnormal postcontrast enhancement or restricted diffusion to suggest intracranial infection/inflammatory process. No significant sinus disease or parameningeal focus of infection. Electronically Signed   By: Elsie Stain M.D.   On: 03/12/2016 17:59   Ct Maxillofacial W Contrast  Result Date: 03/08/2016 CLINICAL DATA:  76 year old female post teeth extraction 6 days ago. Fever. Initial encounter. EXAM: CT MAXILLOFACIAL WITH CONTRAST TECHNIQUE: Multidetector CT imaging of the maxillofacial structures was performed with intravenous contrast. Multiplanar CT image reconstructions were also generated. A small metallic BB was placed on the right temple in order to reliably differentiate right from left. CONTRAST:  75mL ISOVUE-300 IOPAMIDOL (ISOVUE-300) INJECTION 61% COMPARISON:  None. FINDINGS: Osseous: Post extraction of multiple teeth more notable involving lower teeth and greater on the right. In the extraction sites, there is poorly defined soft tissue and gas which may represent expected changes although postoperative inflammation not excluded in proper clinical setting. Orbits: Negative. Sinuses: Minimal mucosal thickening right frontal sinus and ethmoid sinus air cells. Soft tissues: No deep drainable abscess. Limited intracranial: Intracranial vascular calcifications. Hyperostosis frontalis interna. IMPRESSION: Post extraction of multiple teeth more notable involving lower teeth and greater on the right. In the extraction sites, there is poorly defined soft tissue and gas which may represent expected changes although postoperative inflammation not excluded in proper  clinical setting. No deep drainable abscess. Electronically Signed   By: Lacy Duverney M.D.   On: 03/08/2016 21:02   Portable Chest 1 View  Result Date: 03/09/2016 CLINICAL DATA:  Fever EXAM: PORTABLE CHEST 1 VIEW COMPARISON:  03/08/2016 FINDINGS: Cardiomegaly with vascular congestion. Low lung volumes. No confluent opacities or overt edema. No effusions or acute bony abnormality. IMPRESSION: Cardiomegaly, vascular congestion.  Low lung volumes. Electronically Signed   By: Charlett Nose M.D.   On: 03/09/2016 07:53    Time Spent in minutes  30   Pearson Grippe M.D on 03/15/2016 at 11:24 AM  Between 7am to 7pm - Pager - [phone]  After 7pm go to [email or web address] - password [facility]  [facility] -  Office  [phone]

## 2016-03-15 NOTE — Progress Notes (Signed)
INFECTIOUS DISEASE PROGRESS NOTE  ID: Latasha Wang is a 75 y.o. female with  Principal Problem:   Acute encephalopathy Active Problems:   Diabetes (HCC)   Anxiety state   Essential hypertension   GERD   Fever   Acute kidney injury (HCC)   Hyponatremia   Elevated lactic acid level   FUO (fever of unknown origin)   Controlled diabetes mellitus type 2 with complications (HCC)  Subjective: Without complaints.   Abtx:  Anti-infectives    Start     Dose/Rate Route Frequency Ordered Stop   03/11/16 1715  vancomycin (VANCOCIN) IVPB 750 mg/150 ml premix     750 mg 150 mL/hr over 60 Minutes Intravenous Every 12 hours 03/11/16 1703     03/11/16 1715  meropenem (MERREM) 1 g in sodium chloride 0.9 % 100 mL IVPB     1 g 200 mL/hr over 30 Minutes Intravenous Every 8 hours 03/11/16 1703     03/10/16 1800  Ampicillin-Sulbactam (UNASYN) 3 g in sodium chloride 0.9 % 100 mL IVPB  Status:  Discontinued     3 g 100 mL/hr over 60 Minutes Intravenous Every 8 hours 03/10/16 1634 03/11/16 1702   03/09/16 1000  vancomycin (VANCOCIN) IVPB 1000 mg/200 mL premix  Status:  Discontinued     1,000 mg 200 mL/hr over 60 Minutes Intravenous Every 24 hours 03/09/16 0923 03/10/16 1634   03/09/16 0930  piperacillin-tazobactam (ZOSYN) IVPB 3.375 g  Status:  Discontinued     3.375 g 12.5 mL/hr over 240 Minutes Intravenous Every 8 hours 03/09/16 0908 03/10/16 1634   03/08/16 1715  amoxicillin (AMOXIL) capsule 500 mg  Status:  Discontinued     500 mg Oral Every 8 hours 03/08/16 1712 03/09/16 0900      Medications:  Scheduled: . buPROPion  150 mg Oral Daily  . clonazePAM  0.5 mg Oral BID  . enoxaparin (LOVENOX) injection  40 mg Subcutaneous Q24H  . FLUoxetine  40 mg Oral QHS  . insulin aspart  0-5 Units Subcutaneous QHS  . insulin aspart  0-9 Units Subcutaneous TID WC  . meropenem (MERREM) IV  1 g Intravenous Q8H  . rosuvastatin  10 mg Oral Daily  . vancomycin  750 mg Intravenous Q12H     Objective: Vital signs in last 24 hours: Temp:  [98.2 F (36.8 C)-99.6 F (37.6 C)] 98.2 F (36.8 C) (09/24 0645) Pulse Rate:  [63-70] 65 (09/24 0645) Resp:  [18-20] 20 (09/24 0645) BP: (140-156)/(60-71) 156/66 (09/24 0645) SpO2:  [99 %-100 %] 100 % (09/24 0645)   General appearance: alert, cooperative and no distress Resp: clear to auscultation bilaterally Cardio: regular rate and rhythm GI: normal findings: bowel sounds normal and soft, non-tender  Lab Results  Recent Labs  03/12/16 2313 03/14/16 0353 03/15/16 0520  NA  --  141  --   K 3.2* 3.4*  --   CL  --  104  --   CO2  --  26  --   BUN  --  <5*  --   CREATININE  --  0.73 0.68   Liver Panel No results for input(s): PROT, ALBUMIN, AST, ALT, ALKPHOS, BILITOT, BILIDIR, IBILI in the last 72 hours. Sedimentation Rate No results for input(s): ESRSEDRATE in the last 72 hours. C-Reactive Protein No results for input(s): CRP in the last 72 hours.  Microbiology: Recent Results (from the past 240 hour(s))  Culture, blood (Routine x 2)     Status: None   Collection Time:  03/08/16 12:22 PM  Result Value Ref Range Status   Specimen Description BLOOD LEFT WRIST  Final   Special Requests BOTTLES DRAWN AEROBIC AND ANAEROBIC  5CC  Final   Culture NO GROWTH 5 DAYS  Final   Report Status 03/13/2016 FINAL  Final  Culture, blood (Routine x 2)     Status: None   Collection Time: 03/08/16 12:25 PM  Result Value Ref Range Status   Specimen Description BLOOD RIGHT WRIST  Final   Special Requests IN PEDIATRIC BOTTLE  3CC  Final   Culture NO GROWTH 5 DAYS  Final   Report Status 03/13/2016 FINAL  Final  Urine culture     Status: None   Collection Time: 03/08/16  2:52 PM  Result Value Ref Range Status   Specimen Description URINE, RANDOM  Final   Special Requests NONE  Final   Culture NO GROWTH  Final   Report Status 03/09/2016 FINAL  Final  Stool culture (children & immunocomp patients)     Status: None   Collection Time:  03/08/16  9:14 PM  Result Value Ref Range Status   Salmonella/Shigella Screen Final report  Final   Campylobacter Culture Final report  Final   E coli, Shiga toxin Assay Negative Negative Final    Comment: (NOTE) Performed At: [facility] [address] [place], [place] [id number] Mila Homer MD WU:[id number]   STOOL CULTURE REFLEX - RSASHR     Status: None   Collection Time: 03/08/16  9:14 PM  Result Value Ref Range Status   Stool Culture result 1 (RSASHR) Comment  Final    Comment: (NOTE) No Salmonella or Shigella recovered. Performed At: [facility] [address] [place], [place] [id number] Mila Homer MD VH:[id number]   STOOL CULTURE Reflex - CMPCXR     Status: None   Collection Time: 03/08/16  9:14 PM  Result Value Ref Range Status   Stool Culture result 1 (CMPCXR) Comment  Final    Comment: (NOTE) No Campylobacter species isolated. Performed At: [facility] [address] [place], [place] [id number] Mila Homer MD [id number]   Culture, blood (routine x 2)     Status: None   Collection Time: 03/09/16  6:35 AM  Result Value Ref Range Status   Specimen Description BLOOD LEFT ARM  Final   Special Requests BOTTLES DRAWN AEROBIC AND ANAEROBIC 5CC  Final   Culture NO GROWTH 5 DAYS  Final   Report Status 03/14/2016 FINAL  Final  Culture, blood (routine x 2)     Status: None   Collection Time: 03/09/16  6:54 AM  Result Value Ref Range Status   Specimen Description BLOOD LEFT HAND  Final   Special Requests BOTTLES DRAWN AEROBIC AND ANAEROBIC 5CC  Final   Culture NO GROWTH 5 DAYS  Final   Report Status 03/14/2016 FINAL  Final    Studies/Results: No results found.   Assessment/Plan: Fever  Sepsis Acute encephalopathy Possible Dental Space Infection DM HTN AKI    Total days of antibiotics: Prior to admission- Amoxicillin 9/17 - 9/18 9/18 Vanc >>9/19; 9/20 >> 9/18 Zosyn >> 9/19 9/19 Unasyn >> 9/20 9/20 Meropenem  >>  Will stop merrem Change to augmentin for 1 week.  Explained to family Available as needed.          Johny Sax Infectious Diseases (pager) [phone] [email or web address] 03/15/2016, 2:13 PM  LOS: 6 days

## 2016-03-16 DIAGNOSIS — K760 Fatty (change of) liver, not elsewhere classified: Secondary | ICD-10-CM

## 2016-03-16 DIAGNOSIS — R410 Disorientation, unspecified: Secondary | ICD-10-CM

## 2016-03-16 DIAGNOSIS — E118 Type 2 diabetes mellitus with unspecified complications: Secondary | ICD-10-CM

## 2016-03-16 DIAGNOSIS — E119 Type 2 diabetes mellitus without complications: Secondary | ICD-10-CM

## 2016-03-16 DIAGNOSIS — R5381 Other malaise: Secondary | ICD-10-CM

## 2016-03-16 DIAGNOSIS — D62 Acute posthemorrhagic anemia: Secondary | ICD-10-CM

## 2016-03-16 DIAGNOSIS — E871 Hypo-osmolality and hyponatremia: Secondary | ICD-10-CM

## 2016-03-16 DIAGNOSIS — I10 Essential (primary) hypertension: Secondary | ICD-10-CM

## 2016-03-16 DIAGNOSIS — G9340 Encephalopathy, unspecified: Secondary | ICD-10-CM

## 2016-03-16 DIAGNOSIS — E876 Hypokalemia: Secondary | ICD-10-CM

## 2016-03-16 LAB — GLUCOSE, CAPILLARY
Glucose-Capillary: 117 mg/dL — ABNORMAL HIGH (ref 65–99)
Glucose-Capillary: 110 mg/dL — ABNORMAL HIGH (ref 65–99)
Glucose-Capillary: 139 mg/dL — ABNORMAL HIGH (ref 65–99)

## 2016-03-16 LAB — CBC
HEMATOCRIT: 34.5 % — AB (ref 36.0–46.0)
Hemoglobin: 10.9 g/dL — ABNORMAL LOW (ref 12.0–15.0)
MCH: 28.4 pg (ref 26.0–34.0)
MCHC: 31.6 g/dL (ref 30.0–36.0)
MCV: 89.8 fL (ref 78.0–100.0)
PLATELETS: 245 10*3/uL (ref 150–400)
RBC: 3.84 MIL/uL — ABNORMAL LOW (ref 3.87–5.11)
RDW: 15.1 % (ref 11.5–15.5)
WBC: 7.2 10*3/uL (ref 4.0–10.5)

## 2016-03-16 LAB — COMPREHENSIVE METABOLIC PANEL
ALBUMIN: 3.4 g/dL — AB (ref 3.5–5.0)
ALT: 32 U/L (ref 14–54)
ANION GAP: 8 (ref 5–15)
AST: 27 U/L (ref 15–41)
Alkaline Phosphatase: 42 U/L (ref 38–126)
BILIRUBIN TOTAL: 1.6 mg/dL — AB (ref 0.3–1.2)
CALCIUM: 8.8 mg/dL — AB (ref 8.9–10.3)
CO2: 26 mmol/L (ref 22–32)
Chloride: 106 mmol/L (ref 101–111)
Creatinine, Ser: 0.63 mg/dL (ref 0.44–1.00)
GFR calc Af Amer: >60 mL/min (ref >60)
GLUCOSE: 119 mg/dL — AB (ref 65–99)
Potassium: 3.2 mmol/L — ABNORMAL LOW (ref 3.5–5.1)
Sodium: 140 mmol/L (ref 135–145)
TOTAL PROTEIN: 6.2 g/dL — AB (ref 6.5–8.1)

## 2016-03-16 MED ORDER — AMOXICILLIN-POT CLAVULANATE 875-125 MG PO TABS: 1.0000 | ORAL_TABLET | Freq: Two times a day (BID) | ORAL | Status: DC

## 2016-03-16 NOTE — Progress Notes (Signed)
Physical Therapy Treatment Patient Details Name: Latasha Wang MRN: [id number] DOB: 12/19/1940 Today's Date: 03/16/2016    History of Present Illness Latasha Wang is an 75 y.o. female with a PMH of DM, HTN, fatty liver disease, HLD, GERD, anxiety, and depression admitted on 9/17 for fever with unknown source and acute encephalopathy    PT Comments    Current plan remains appropriate. Pt required mod A for ambulation and continues to demo balance and strength deficits as well as decreased problem solving abilities and remains high fall risk. Continue to progress as tolerated.   Follow Up Recommendations  CIR     Equipment Recommendations   (TBA)    Recommendations for Other Services       Precautions / Restrictions Precautions Precautions: Fall Restrictions Weight Bearing Restrictions: No    Mobility  Bed Mobility Overal bed mobility: Needs Assistance Bed Mobility: Supine to Sit     Supine to sit: Min guard;HOB elevated     General bed mobility comments: min guard for safety; HOB elevated and use  of rails; increased time and effort  Transfers Overall transfer level: Needs assistance Equipment used: None;Rolling walker (2 wheeled) Transfers: Sit to/from Stand Sit to Stand: Min assist         General transfer comment: [facility] with/without AD and [facility]; multimodal cues for hand placement; assist for balance  Ambulation/Gait Ambulation/Gait assistance: Mod assist Ambulation Distance (Feet): 50 Feet Assistive device: Rolling walker (2 wheeled) Gait Pattern/deviations: Shuffle;Decreased stride length;Trunk flexed     General Gait Details: pt unable to take steps forward without AD and very unsteady; pt required mod assist and max multimodal cues for  posture, safe use of AD, increased bilat step length/heel strike and assist to manage RW as pt was running into objects in room and hallway; pt with frequent changes in gait speed with anterior  bias   Stairs            Wheelchair Mobility    Modified Rankin (Stroke Patients Only)       Balance Overall balance assessment: Needs assistance   Sitting balance-Leahy Scale: Fair       Standing balance-Leahy Scale: Fair Standing balance comment: static standing fair, dynamic standing poor                    Cognition Arousal/Alertness: Awake/alert Behavior During Therapy: Flat affect Overall Cognitive Status: Impaired/Different from baseline Area of Impairment: Orientation;Memory;Following commands;Problem solving;Safety/judgement;Awareness Orientation Level: Disoriented to;Time;Situation   Memory: Decreased short-term memory Following Commands: Follows one step commands inconsistently;Follows one step commands with increased time Safety/Judgement: Decreased awareness of safety Awareness: Intellectual Problem Solving: Slow processing;Decreased initiation;Difficulty sequencing;Requires tactile cues;Requires verbal cues      Exercises      General Comments        Pertinent Vitals/Pain Pain Assessment: No/denies pain    Home Living                      Prior Function            PT Goals (current goals can now be found in the care plan section) Acute Rehab PT Goals Patient Stated Goal: go home Progress towards PT goals: Progressing toward goals    Frequency    Min 3X/week      PT Plan Current plan remains appropriate    Co-evaluation             End of Session Equipment  Utilized During Treatment: Gait belt Activity Tolerance: Patient tolerated treatment well Patient left: in chair;with call bell/phone within reach;with chair alarm set     Time: [id number] PT Time Calculation (min) (ACUTE ONLY): 32 min  Charges:  $Gait Training: 8-22 mins $Therapeutic Activity: 8-22 mins                    G Codes:      Derek Mound, PTA Pager: [phone]   03/16/2016, 9:41 AM

## 2016-03-16 NOTE — Care Management Important Message (Signed)
Important Message  Patient Details  Name: Latasha Wang MRN: [id number] Date of Birth: 02/11/1941   Medicare Important Message Given:  Yes    Kyla BalzarineShealy, Frances Joynt Abena 03/16/2016, 11:26 AM

## 2016-03-16 NOTE — Progress Notes (Signed)
Pt and daughter agreeable to SN[facility]. CSW submitted insurance.  Osborne Cascoadia Lidwina Kaner LCSWA [phone]

## 2016-03-16 NOTE — Progress Notes (Signed)
Latasha Wang to be D/C'd to SNF per MD order.  Discussed with the patient and all questions fully answered.  VSS, Skin clean, dry and intact without evidence of skin break down, no evidence of skin tears noted. IV catheter discontinued intact. Site without signs and symptoms of complications. Dressing and pressure applied.  An After Visit Summary was printed and given to the patient. PTAR received prescription.  D/c education completed with patient/family including follow up instructions, medication list, d/c activities limitations if indicated, with other d/c instructions as indicated by MD - patient able to verbalize understanding, all questions fully answered.   Patient instructed to return to ED, call 911, or call MD for any changes in condition.   Patient escorted via stretcher, and D/C to SNF via private auto.  Theressa StampsKayla L Price 03/16/2016 7:33 PM

## 2016-03-16 NOTE — Clinical Social Work Placement (Signed)
   CLINICAL SOCIAL WORK PLACEMENT  NOTE  Date:  03/16/2016  Patient Details  Name: Latasha Wang T Layson MRN: [id number] Date of Birth: 11/10/1940  Clinical Social Work is seeking post-discharge placement for this patient at the Skilled  Nursing Facility level of care (*CSW will initial, date and re-position this form in  chart as items are completed):  Yes   Patient/family provided with [place] Clinical Social Work Department's list of facilities offering this level of care within the geographic area requested by the patient (or if unable, by the patient's family).  Yes   Patient/family informed of their freedom to choose among providers that offer the needed level of care, that participate in Medicare, Medicaid or managed care program needed by the patient, have an available bed and are willing to accept the patient.  Yes   Patient/family informed of [facility]'s ownership interest in [facility] and [facility], as well as of the fact that they are under no obligation to receive care at these facilities.  PASRR submitted to EDS on 03/12/16     PASRR number received on 03/12/16     Existing PASRR number confirmed on       FL2 transmitted to all facilities in geographic area requested by pt/family on 03/12/16     FL2 transmitted to all facilities within larger geographic area on       Patient informed that his/her managed care company has contracts with or will negotiate with certain facilities, including the following:        Yes   Patient/family informed of bed offers received.  [facility]     Physician recommends and patient chooses bed at      [facility].  Patient to be transferred to facility by PTAR     Patient family notified on 03/16/16 of transfer.  Name of family member notified:  Daughter     PHYSICIAN Please sign FL2     Additional Comment:     _______________________________________________ Mearl LatinNadia S Jannetta Massey, LCSWA 03/16/2016, 4:16 PM

## 2016-03-16 NOTE — Consult Note (Signed)
Physical Medicine and Rehabilitation Consult Reason for Consult: Acute encephalopathy Referring Physician: [facility]   HPI: Latasha Wang is a 75 y.o. right handed female with history of diabetes mellitus, hypertension, fatty liver disease. Patient with recent extraction of 14 teeth in anticipation for planned dentures. Per chart review patient independent prior to admission living with daughter. 2 level home with bed and bath upstairs. 4 steps to entry. Presented 03/08/2016 with fever 103.2, hyponatremia 131 as well as lethargy and decreased oral intake. Urine culture negative. Blood cultures no growth. CT/MRI of the brain without acute infarct. Chest x-ray no edema or consolidation. Bilateral internal jugular is patent no evidence of thrombosis. CT maxillofacial with no deep drainable abscess. Maintained on broad-spectrum antibiotics. Maintain on Lovenox for DVT prophylaxis. Tolerating a mechanical soft diet.   Review of Systems  Constitutional: Negative for chills.  HENT: Negative for hearing loss.   Eyes: Negative for blurred vision and double vision.  Respiratory: Negative for cough and shortness of breath.   Cardiovascular: Negative for chest pain, palpitations and leg swelling.  Gastrointestinal: Positive for constipation. Negative for nausea and vomiting.       GERD  Genitourinary: Negative for dysuria and hematuria.  Musculoskeletal: Positive for myalgias.  Skin: Negative for rash.  Neurological: Positive for weakness. Negative for seizures and headaches.  Psychiatric/Behavioral: Positive for depression and memory loss.       Anxiety  All other systems reviewed and are negative.  Past Medical History:  Diagnosis Date  . ANXIETY   . DEPRESSION   . Diabetes mellitus   . FATTY LIVER DISEASE   . Gastroparesis   . GERD   . HYPERLIPIDEMIA   . HYPERTENSION   . LATERAL EPICONDYLITIS, RIGHT    Past Surgical History:  Procedure Laterality Date  . CATARACT EXTRACTION,  BILATERAL    . TOTAL ELBOW ARTHROPLASTY  02/19/2012   Procedure: TOTAL ELBOW ARTHROPLASTY;  Surgeon: Budd PalmerMichael H Handy, MD;  Location: [facility] OR;  Service: Orthopedics;  Laterality: Left;   History reviewed. No pertinent family history. Social History:  reports that she has never smoked. She has never used smokeless tobacco. She reports that she does not drink alcohol or use drugs. Allergies: No Known Allergies Medications Prior to Admission  Medication Sig Dispense Refill  . amLODipine (NORVASC) 10 MG tablet TAKE 1 TABLET BY MOUTH EVERY DAY 90 tablet 3  . amoxicillin (AMOXIL) 500 MG tablet Take 500 mg by mouth 4 (four) times daily. Before meals and at bedtime  0  . buPROPion (WELLBUTRIN XL) 150 MG 24 hr tablet TAKE 1 TABLET BY MOUTH DAILY 90 tablet 0  . clonazePAM (KLONOPIN) 0.5 MG tablet TAKE 1 TABLET BY MOUTH TWICE A DAY AS NEEDED (Patient taking differently: TAKE 1 TABLET BY MOUTH TWICE A DAY) 60 tablet 2  . CRESTOR 10 MG tablet TAKE 1 TABLET (10 MG TOTAL) BY MOUTH DAILY. 90 tablet 1  . FLUoxetine (PROZAC) 40 MG capsule TAKE ONE CAPSULE BY MOUTH DAILY (Patient taking differently: TAKE ONE CAPSULE BY MOUTH DAILY AT BEDTIME) 90 capsule 3  . hydrochlorothiazide (MICROZIDE) 12.5 MG capsule TAKE ONE CAPSULE BY MOUTH EVERY DAY 90 capsule 2  . HYDROcodone-acetaminophen (NORCO) 10-325 MG tablet Take 1 tablet by mouth every 3 (three) hours as needed (pain).   0  . irbesartan (AVAPRO) 300 MG tablet TAKE 1 TABLET (300 MG TOTAL) BY MOUTH AT BEDTIME. 90 tablet 1  . ketoconazole (NIZORAL) 2 % shampoo Apply 1 application topically every  Friday.  4  . meclizine (ANTIVERT) 12.5 MG tablet Take 1-2 tablets (12.5-25 mg total) by mouth every 6 (six) hours as needed. For dizziness (Patient taking differently: Take 12.5-25 mg by mouth every 6 (six) hours as needed for dizziness (vertigo). For dizziness) 30 tablet 0  . metFORMIN (GLUCOPHAGE-XR) 500 MG 24 hr tablet TAKE 1 TABLET BY MOUTH TWICE A DAY 180 tablet 1  .  OLANZapine (ZYPREXA) 5 MG tablet Take 1 tablet (5 mg total) by mouth daily. 30 tablet 5  . pioglitazone (ACTOS) 45 MG tablet TAKE 1 TABLET (45 MG TOTAL) BY MOUTH DAILY. 90 tablet 2  . acetaminophen (TYLENOL) 500 MG tablet Take 1-2 tablets (500-1,000 mg total) by mouth every 6 (six) hours as needed for pain or fever. For pain (Patient not taking: Reported on 03/08/2016) 90 tablet 0  . albuterol (PROVENTIL HFA;VENTOLIN HFA) 108 (90 BASE) MCG/ACT inhaler Inhale 2 puffs into the lungs every 6 (six) hours as needed for wheezing. (Patient not taking: Reported on 03/08/2016) 1 Inhaler 0  . buPROPion (WELLBUTRIN XL) 150 MG 24 hr tablet Take 1 tablet (150 mg total) by mouth daily. (Patient not taking: Reported on 03/08/2016) 90 tablet 3  . fexofenadine (ALLEGRA) 180 MG tablet Take 1 tablet (180 mg total) by mouth daily. (Patient not taking: Reported on 03/08/2016) 90 tablet 3  . hydrochlorothiazide (MICROZIDE) 12.5 MG capsule Take 1 capsule (12.5 mg total) by mouth daily. (Patient not taking: Reported on 03/08/2016) 90 capsule 2  . rosuvastatin (CRESTOR) 20 MG tablet Take 1 tablet (20 mg total) by mouth daily. (Patient not taking: Reported on 03/08/2016) 90 tablet 3    Home: Home Living Family/patient expects to be discharged to:: Private residence Living Arrangements: Children, Spouse/significant other Available Help at Discharge: Family Type of Home: House Home Access: Stairs to enter Secretary/administrator of Steps: 4 Entrance Stairs-Rails: Right, Left Home Layout: Two level, Bed/bath upstairs, 1/2 bath on main level Alternate Level Stairs-Number of Steps: flight Alternate Level Stairs-Rails: Right, Left Bathroom Shower/Tub: Engineer, manufacturing systems: Standard Home Equipment: None  Functional History: Prior Function Level of Independence: Independent Functional Status:  Mobility: Bed Mobility Overal bed mobility: Needs Assistance Bed Mobility: Sit to Supine Sit to supine: Mod  assist General bed mobility comments: pt up in recliner Transfers Overall transfer level: Needs assistance Equipment used: None Transfers: Sit to/from Stand Sit to Stand: Min assist General transfer comment: assist to stand; for weight shift and for balance  Ambulation/Gait Ambulation/Gait assistance: Min assist, Mod assist Ambulation Distance (Feet): 120 Feet Assistive device: None, 1 person hand held assist Gait Pattern/deviations: Shuffle, Decreased stride length, Narrow base of support, Step-to pattern General Gait Details: Initially with RW, pt stood and held on but did not push walker until needing to use bathroom, then pushed it out of her way to get to [facility]; without device, but min to mod A for balance/safety. Shuffling pattern and at times with anterior bias, cue for increased foot clearance and for safe speed; pt with minimal arm swing, decreased trunk rotation. poor ability to negotiate around obstacles    ADL: ADL Overall ADL's : Needs assistance/impaired Eating/Feeding: Set up, Sitting Grooming: Wash/dry hands, Wash/dry face, Set up, Sitting Upper Body Bathing: Set up, Supervision/ safety, Sitting Upper Body Bathing Details (indicate cue type and reason): cues for initiation and thoroughness (simulated) Lower Body Bathing: Minimal assistance Lower Body Bathing Details (indicate cue type and reason): cues for initiation and thoroughness (simulated) Upper Body Dressing : Set up, Supervision/safety  Upper Body Dressing Details (indicate cue type and reason): cues for initiation (simulated) Lower Body Dressing: Min guard, Sitting/lateral leans Lower Body Dressing Details (indicate cue type and reason): doffing/donning socks Toilet Transfer: Min guard, RW, BSC, Cueing for sequencing, Cueing for safety Toileting- Clothing Manipulation and Hygiene: Min guard, Sit to/from stand Functional mobility during ADLs: Min guard, Cueing for safety, Cueing for  sequencing  Cognition: Cognition Overall Cognitive Status: Impaired/Different from baseline Orientation Level: Oriented to person, Oriented to place, Disoriented to time, Disoriented to situation Cognition Arousal/Alertness: Awake/alert Behavior During Therapy: Flat affect Overall Cognitive Status: Impaired/Different from baseline Area of Impairment: Orientation, Memory, Following commands, Problem solving Orientation Level: Disoriented to, Time, Situation Memory: Decreased short-term memory Following Commands: Follows one step commands inconsistently, Follows one step commands with increased time Problem Solving: Slow processing, Decreased initiation, Difficulty sequencing, Requires tactile cues, Requires verbal cues  Blood pressure (!) 159/72, pulse 64, temperature 98 F (36.7 C), resp. rate 20, height 4\' 9"  (1.448 m), weight 69.8 kg (153 lb 14.1 oz), SpO2 95 %. Physical Exam  Vitals reviewed. Constitutional: She appears well-developed and well-nourished.  HENT:  Head: Normocephalic and atraumatic.  Eyes: Conjunctivae and EOM are normal.  Neck: Normal range of motion. Neck supple. No thyromegaly present.  Cardiovascular: Normal rate and regular rhythm.   Respiratory: Effort normal and breath sounds normal. No respiratory distress.  GI: Soft. Bowel sounds are normal. She exhibits no distension.  Musculoskeletal: She exhibits no edema or tenderness.  Neurological: She is alert.  Makes eye contact with examiner.  Inconsistently follows commands. A&Ox2 Motor: 4+/5 throughout (limited due to participation)  Skin: Skin is warm and dry.  Psychiatric: Her affect is blunt. Her speech is delayed and tangential. She is slowed. Cognition and memory are impaired. She expresses inappropriate judgment.    Results for orders placed or performed during the hospital encounter of 03/08/16 (from the past 24 hour(s))  Glucose, capillary     Status: Abnormal   Collection Time: 03/15/16  8:02 AM   Result Value Ref Range   Glucose-Capillary 113 (H) 65 - 99 mg/dL  Glucose, capillary     Status: Abnormal   Collection Time: 03/15/16 11:30 AM  Result Value Ref Range   Glucose-Capillary 122 (H) 65 - 99 mg/dL  Glucose, capillary     Status: Abnormal   Collection Time: 03/15/16  4:31 PM  Result Value Ref Range   Glucose-Capillary 128 (H) 65 - 99 mg/dL  Glucose, capillary     Status: Abnormal   Collection Time: 03/15/16  9:08 PM  Result Value Ref Range   Glucose-Capillary 138 (H) 65 - 99 mg/dL   Comment 1 Notify RN    Comment 2 Document in Chart    No results found.  Assessment/Plan: Diagnosis: Acute encephalopathy Labs and images independently reviewed.  Records reviewed and summated above.  1. Does the need for close, 24 hr/day medical supervision in concert with the patient's rehab needs make it unreasonable for this patient to be served in a less intensive setting? Yes  2. Co-Morbidities requiring supervision/potential complications: DM (Monitor in accordance with exercise and adjust meds as necessary), HTN (monitor and provide prns in accordance with increased physical exertion and pain), fatty liver disease (avoid hepatotoxic meds), hyponatremia (cont to monitor, treat if necessary), hypokalemia (continue to monitor and replete as necessary), ABLA (transfuse if necessary to ensure appropriate perfusion for increased activity tolerance) 3. Due to safety, disease management, medication administration and patient education, does the patient require 11  hr/day rehab nursing? Yes 4. Does the patient require coordinated care of a physician, rehab nurse, PT (1-2 hrs/day, 5 days/week), OT (1-2 hrs/day, 5 days/week) and SLP (1-2 hrs/day, 5 days/week) to address physical and functional deficits in the context of the above medical diagnosis(es)? Yes Addressing deficits in the following areas: balance, endurance, locomotion, strength, transferring, bathing, dressing, toileting, cognition and  psychosocial support 5. Can the patient actively participate in an intensive therapy program of at least 3 hrs of therapy per day at least 5 days per week? Yes 6. The potential for patient to make measurable gains while on inpatient rehab is excellent 7. Anticipated functional outcomes upon discharge from inpatient rehab are modified independent and supervision  with PT, modified independent and supervision with OT, modified independent and supervision with SLP. 8. Estimated rehab length of stay to reach the above functional goals is: 8-11 days. 9. Does the patient have adequate social supports and living environment to accommodate these discharge functional goals? Yes 10. Anticipated D/C setting: Home 11. Anticipated post D/C treatments: Outpatient therapy and Home excercise program 12. Overall Rehab/Functional Prognosis: excellent  RECOMMENDATIONS: This patient's condition is appropriate for continued rehabilitative care in the following setting: Would recommend a short CIR stay, however, per family, pt is planning on going to a SNF.  Patient has agreed to participate in recommended program. Potentially Note that insurance prior authorization may be required for reimbursement for recommended care.  Comment: Rehab Admissions Coordinator to follow up.  Maryla Morrow, MD, [place] [facility] 03/16/2016

## 2016-03-16 NOTE — Discharge Summary (Signed)
Physician Discharge Summary  JULIEANA ESHLEMAN WUJ:[id number] DOB: September 12, 1940 DOA: 03/08/2016  PCP: Oliver Barre, MD  Admit date: 03/08/2016 Discharge date: 03/16/2016  Time spent: 45 minutes  Recommendations for Outpatient Follow-up:  1. PCP Dr.John in 1 week 2. Oral Surgeon Dr.Edwin Lorin Picket in few days, needs sutures removed   Discharge Diagnoses:  Principal Problem:   Acute encephalopathy   Sepsis   Suspected Dental space infection   Diabetes (HCC)   Anxiety state   Essential hypertension   GERD   Fever   Acute kidney injury (HCC)   Hyponatremia   Elevated lactic acid level   FUO (fever of unknown origin)   Controlled diabetes mellitus type 2 with complications (HCC)   Confusion   Debility   Fatty liver disease, nonalcoholic   Hypokalemia   Discharge Condition: improved  Diet recommendation: low sodium diet  Filed Weights   03/08/16 1207 03/09/16 0402 03/14/16 0607  Weight: 77.1 kg (170 lb) 75.7 kg (166 lb 14.2 oz) 69.8 kg (153 lb 14.1 oz)    History of present illness:  Latasha T Swindleris a pleasant 75 y.o.AFwith medical history significant for diabetes, hypertension, fatty liver disease, depression, anxiety recent extraction of 14 teeth on 9/11 by Dr.Edward Lorin Picket presented to the emergency department with chief complaint of fever. Initial evaluation reveals a temperature of 103.2 orally, elevated lactic acid, hyponatremia, acute kidney injury and acute encephalopathy.  Hospital Course:  Sepsis/FUO -likely due to recent extraction of 14teeth, some with dental abscess prior to surgery  -started IV Zosyn and Vancomycin on admission -CT maxillofacial did not show abscess -ID consulted and Abx changed to IV Unasyn and then to Primaxin -mentation improved and finally became afebrile, now back to baseline -Blood cultures-NGTD -2D ECHO-unremarkable-lactate cleared -ID consulted per Dr.Hatcher appreciated, changed to Augmentin yesterday, remains afebrile and stable -abeing  discharged to Rehab and -needs FU with ORal Surgeon Dr.Edward Scott-needs sutures removed  Toxic metabolic encephalopathy -due to above infection, improving with Abx -no meningeal signs, no neck stiffness -CT head unremarkable  -Mentation improved now -resumed klonopin and Wellbutrin due to anxiety  DM -stable, SSI for now, resume home meds post discharge  HTN -stable, resumed home BP meds  Anxiety/Depression -resumed klonopin, wellbutrin and prozac  AKI -due to sepsis, dehydration with Diuretic and ARB use -resolved, continue IVF  Hyponatremia -due to dehydration/sepsis -improved, cut down IV  Consultations:  ID Dr.Hatcher  Discharge Exam: Vitals:   03/16/16 0542 03/16/16 1353  BP: (!) 159/72 136/68  Pulse: 64 67  Resp: 20   Temp: 98 F (36.7 C) 99.5 F (37.5 C)    General: AAOx3 Cardiovascular: S1S2/RRR Respiratory: CTAB  Discharge Instructions   Discharge Instructions    Diet - low sodium heart healthy    Complete by:  As directed    Increase activity slowly    Complete by:  As directed      Current Discharge Medication List    START taking these medications   Details  amoxicillin-clavulanate (AUGMENTIN) 875-125 MG tablet Take 1 tablet by mouth every 12 (twelve) hours. For 7days      CONTINUE these medications which have NOT CHANGED   Details  amLODipine (NORVASC) 10 MG tablet TAKE 1 TABLET BY MOUTH EVERY DAY Qty: 90 tablet, Refills: 3    clonazePAM (KLONOPIN) 0.5 MG tablet TAKE 1 TABLET BY MOUTH TWICE A DAY AS NEEDED Qty: 60 tablet, Refills: 2    CRESTOR 10 MG tablet TAKE 1 TABLET (10 MG TOTAL) BY MOUTH  DAILY. Qty: 90 tablet, Refills: 1    hydrochlorothiazide (MICROZIDE) 12.5 MG capsule TAKE ONE CAPSULE BY MOUTH EVERY DAY Qty: 90 capsule, Refills: 2    ketoconazole (NIZORAL) 2 % shampoo Apply 1 application topically every Friday. Refills: 4    meclizine (ANTIVERT) 12.5 MG tablet Take 1-2 tablets (12.5-25 mg total) by mouth every  6 (six) hours as needed. For dizziness Qty: 30 tablet, Refills: 0    metFORMIN (GLUCOPHAGE-XR) 500 MG 24 hr tablet TAKE 1 TABLET BY MOUTH TWICE A DAY Qty: 180 tablet, Refills: 1    pioglitazone (ACTOS) 45 MG tablet TAKE 1 TABLET (45 MG TOTAL) BY MOUTH DAILY. Qty: 90 tablet, Refills: 2    acetaminophen (TYLENOL) 500 MG tablet Take 1-2 tablets (500-1,000 mg total) by mouth every 6 (six) hours as needed for pain or fever. For pain Qty: 90 tablet, Refills: 0    albuterol (PROVENTIL HFA;VENTOLIN HFA) 108 (90 BASE) MCG/ACT inhaler Inhale 2 puffs into the lungs every 6 (six) hours as needed for wheezing. Qty: 1 Inhaler, Refills: 0    buPROPion (WELLBUTRIN XL) 150 MG 24 hr tablet Take 1 tablet (150 mg total) by mouth daily. Qty: 90 tablet, Refills: 3    FLUoxetine (PROZAC) 40 MG capsule TAKE ONE CAPSULE BY MOUTH DAILY Qty: 90 capsule, Refills: 3      STOP taking these medications     amoxicillin (AMOXIL) 500 MG tablet      HYDROcodone-acetaminophen (NORCO) 10-325 MG tablet      irbesartan (AVAPRO) 300 MG tablet      OLANZapine (ZYPREXA) 5 MG tablet      fexofenadine (ALLEGRA) 180 MG tablet        No Known Allergies    The results of significant diagnostics from this hospitalization (including imaging, microbiology, ancillary and laboratory) are listed below for reference.    Significant Diagnostic Studies: Dg Chest 2 View  Result Date: 03/11/2016 CLINICAL DATA:  Cough.  Hypertension. EXAM: CHEST  2 VIEW COMPARISON:  March 09, 2016 FINDINGS: There is no edema or consolidation. Heart is mildly enlarged with pulmonary vascularity within normal limits. There is atherosclerotic calcification in the aorta. No adenopathy. There is evidence of old trauma involving the proximal left humerus, stable. Patient has had elbow replacement on the left. IMPRESSION: No edema or consolidation. Mild cardiomegaly. Aortic atherosclerosis. Electronically Signed   By: Bretta BangWilliam  Woodruff III M.D.    On: 03/11/2016 10:24   Dg Chest 2 View  Result Date: 03/08/2016 CLINICAL DATA:  Chest pain for 2 years, hypertension, diabetes mellitus EXAM: CHEST  2 VIEW COMPARISON:  08/11/2012 FINDINGS: Enlargement of cardiac silhouette. Calcified tortuous aorta. Pulmonary vascularity and mediastinal contours otherwise normal. Minimal RIGHT basilar atelectasis. Lungs otherwise clear. No pleural effusion or pneumothorax. Diffuse osseous demineralization. IMPRESSION: Enlargement of cardiac silhouette. Minimal RIGHT basilar atelectasis. Aortic atherosclerosis. Electronically Signed   By: Ulyses SouthwardMark  Boles M.D.   On: 03/08/2016 13:19   Ct Head Wo Contrast  Result Date: 03/09/2016 CLINICAL DATA:  Fever have recent dental extractions possible sepsis EXAM: CT HEAD WITHOUT CONTRAST TECHNIQUE: Contiguous axial images were obtained from the base of the skull through the vertex without intravenous contrast. COMPARISON:  CT brain scan of 10/30/2011 FINDINGS: Brain: The ventricular system is unchanged in size and configuration compared to the prior CT, and there is only mild cortical atrophy present. The septum remains midline in position. The fourth ventricle and basilar cisterns are unchanged. Benign-appearing basal ganglial calcifications are present bilaterally. No hemorrhage, mass lesion, or  acute infarction is seen. Vascular: On this unenhanced study, no vascular abnormality is noted. Skull: On bone window images, no calvarial abnormality is seen. Sinuses/Orbits: The paranasal sinuses appear well pneumatized. No sinusitis is noted. Other: None IMPRESSION: No acute intracranial abnormality.  Minimal atrophy for age Electronically Signed   By: Dwyane Dee M.D.   On: 03/09/2016 11:34   Mr Laqueta Jean ZO Contrast  Result Date: 03/12/2016 CLINICAL DATA:  75 y.o. female admitted on 03/08/2016 with rule out meningitis - patient has fever of unknown origin and possible dental space infection. Patient is agitated and confused with neck pain  EXAM: MRI HEAD WITHOUT AND WITH CONTRAST TECHNIQUE: Multiplanar, multiecho pulse sequences of the brain and surrounding structures were obtained without and with intravenous contrast. CONTRAST:  15mL MULTIHANCE GADOBENATE DIMEGLUMINE 529 MG/ML IV SOLN COMPARISON:  CT head 03/09/2016. FINDINGS: The patient was unable to remain motionless for the exam. Small or subtle lesions could be overlooked. Brain: No evidence for acute infarction, hemorrhage, mass lesion, hydrocephalus, or extra-axial fluid. Mild atrophy. Minor white matter disease, nonspecific. Post infusion, no abnormal enhancement of the brain or meninges. Vascular: Normal flow voids. Skull and upper cervical spine: Partial empty sella. No midline abnormality. Sinuses/Orbits: No orbital masses or proptosis. Globes appear symmetric. Sinuses appear well aerated, without evidence for air-fluid level. Other: No nasopharyngeal pathology or mastoid fluid. Scalp and other visualized extracranial soft tissues grossly unremarkable. IMPRESSION: Motion degraded examination demonstrating atrophy without acute intracranial findings. No abnormal postcontrast enhancement or restricted diffusion to suggest intracranial infection/inflammatory process. No significant sinus disease or parameningeal focus of infection. Electronically Signed   By: Elsie Stain M.D.   On: 03/12/2016 17:59   Ct Maxillofacial W Contrast  Result Date: 03/08/2016 CLINICAL DATA:  75 year old female post teeth extraction 6 days ago. Fever. Initial encounter. EXAM: CT MAXILLOFACIAL WITH CONTRAST TECHNIQUE: Multidetector CT imaging of the maxillofacial structures was performed with intravenous contrast. Multiplanar CT image reconstructions were also generated. A small metallic BB was placed on the right temple in order to reliably differentiate right from left. CONTRAST:  75mL ISOVUE-300 IOPAMIDOL (ISOVUE-300) INJECTION 61% COMPARISON:  None. FINDINGS: Osseous: Post extraction of multiple teeth more  notable involving lower teeth and greater on the right. In the extraction sites, there is poorly defined soft tissue and gas which may represent expected changes although postoperative inflammation not excluded in proper clinical setting. Orbits: Negative. Sinuses: Minimal mucosal thickening right frontal sinus and ethmoid sinus air cells. Soft tissues: No deep drainable abscess. Limited intracranial: Intracranial vascular calcifications. Hyperostosis frontalis interna. IMPRESSION: Post extraction of multiple teeth more notable involving lower teeth and greater on the right. In the extraction sites, there is poorly defined soft tissue and gas which may represent expected changes although postoperative inflammation not excluded in proper clinical setting. No deep drainable abscess. Electronically Signed   By: Lacy Duverney M.D.   On: 03/08/2016 21:02   Portable Chest 1 View  Result Date: 03/09/2016 CLINICAL DATA:  Fever EXAM: PORTABLE CHEST 1 VIEW COMPARISON:  03/08/2016 FINDINGS: Cardiomegaly with vascular congestion. Low lung volumes. No confluent opacities or overt edema. No effusions or acute bony abnormality. IMPRESSION: Cardiomegaly, vascular congestion.  Low lung volumes. Electronically Signed   By: Charlett Nose M.D.   On: 03/09/2016 07:53    Microbiology: Recent Results (from the past 240 hour(s))  Culture, blood (Routine x 2)     Status: None   Collection Time: 03/08/16 12:22 PM  Result Value Ref Range Status  Specimen Description BLOOD LEFT WRIST  Final   Special Requests BOTTLES DRAWN AEROBIC AND ANAEROBIC  5CC  Final   Culture NO GROWTH 5 DAYS  Final   Report Status 03/13/2016 FINAL  Final  Culture, blood (Routine x 2)     Status: None   Collection Time: 03/08/16 12:25 PM  Result Value Ref Range Status   Specimen Description BLOOD RIGHT WRIST  Final   Special Requests IN PEDIATRIC BOTTLE  3CC  Final   Culture NO GROWTH 5 DAYS  Final   Report Status 03/13/2016 FINAL  Final  Urine  culture     Status: None   Collection Time: 03/08/16  2:52 PM  Result Value Ref Range Status   Specimen Description URINE, RANDOM  Final   Special Requests NONE  Final   Culture NO GROWTH  Final   Report Status 03/09/2016 FINAL  Final  Stool culture (children & immunocomp patients)     Status: None   Collection Time: 03/08/16  9:14 PM  Result Value Ref Range Status   Salmonella/Shigella Screen Final report  Final   Campylobacter Culture Final report  Final   E coli, Shiga toxin Assay Negative Negative Final    Comment: (NOTE) Performed At: [facility] [address] [place], [place] [id number] Mila Homer MD NW:[id number]   STOOL CULTURE REFLEX - RSASHR     Status: None   Collection Time: 03/08/16  9:14 PM  Result Value Ref Range Status   Stool Culture result 1 (RSASHR) Comment  Final    Comment: (NOTE) No Salmonella or Shigella recovered. Performed At: [facility] [address] [place], [place] [id number] Mila Homer MD BM:[id number]   STOOL CULTURE Reflex - CMPCXR     Status: None   Collection Time: 03/08/16  9:14 PM  Result Value Ref Range Status   Stool Culture result 1 (CMPCXR) Comment  Final    Comment: (NOTE) No Campylobacter species isolated. Performed At: [facility] [address] [place], [place] [id number] Mila Homer MD IH:[id number]   Culture, blood (routine x 2)     Status: None   Collection Time: 03/09/16  6:35 AM  Result Value Ref Range Status   Specimen Description BLOOD LEFT ARM  Final   Special Requests BOTTLES DRAWN AEROBIC AND ANAEROBIC 5CC  Final   Culture NO GROWTH 5 DAYS  Final   Report Status 03/14/2016 FINAL  Final  Culture, blood (routine x 2)     Status: None   Collection Time: 03/09/16  6:54 AM  Result Value Ref Range Status   Specimen Description BLOOD LEFT HAND  Final   Special Requests BOTTLES DRAWN AEROBIC AND ANAEROBIC 5CC  Final   Culture NO GROWTH 5 DAYS  Final   Report Status 03/14/2016  FINAL  Final     Labs: Basic Metabolic Panel:  Recent Labs Lab 03/10/16 0443 03/11/16 0630 03/12/16 0515 03/12/16 1427 03/12/16 2313 03/14/16 0353 03/15/16 0520 03/16/16 0707  NA 138 138 139  --   --  141  --  140  K 3.2* 3.0* 2.8* 2.8* 3.2* 3.4*  --  3.2*  CL 106 108 107  --   --  104  --  106  CO2 23 22 22   --   --  26  --  26  GLUCOSE 121* 99 109*  --   --  109*  --  119*  BUN 9 <5* 6  --   --  <5*  --  <  5*  CREATININE 1.02* 0.81 0.86  --   --  0.73 0.68 0.63  CALCIUM 8.2* 8.2* 8.0*  --   --  8.8*  --  8.8*  MG  --   --   --  1.9  --   --   --   --    Liver Function Tests:  Recent Labs Lab 03/16/16 0707  AST 27  ALT 32  ALKPHOS 42  BILITOT 1.6*  PROT 6.2*  ALBUMIN 3.4*   No results for input(s): LIPASE, AMYLASE in the last 168 hours. No results for input(s): AMMONIA in the last 168 hours. CBC:  Recent Labs Lab 03/10/16 0443 03/11/16 0630 03/12/16 0515 03/16/16 0707  WBC 8.3 8.0 7.5 7.2  HGB 10.1* 9.9* 9.5* 10.9*  HCT 32.3* 30.5* 29.8* 34.5*  MCV 89.5 88.9 89.0 89.8  PLT 180 182 161 245   Cardiac Enzymes: No results for input(s): CKTOTAL, CKMB, CKMBINDEX, TROPONINI in the last 168 hours. BNP: BNP (last 3 results) No results for input(s): BNP in the last 8760 hours.  ProBNP (last 3 results) No results for input(s): PROBNP in the last 8760 hours.  CBG:  Recent Labs Lab 03/15/16 1130 03/15/16 1631 03/15/16 2108 03/16/16 0757 03/16/16 1223  GLUCAP 122* 128* 138* 117* 110*       SignedZannie Cove MD.  [facility] 03/16/2016, 3:12 PM

## 2016-03-16 NOTE — Care Management Note (Signed)
Case Management Note  Patient Details  Name: Tawana Scaleam T Dilley MRN: [id number] Date of Birth: 08/31/1940  Subjective/Objective:                    Action/Plan:  DC to SNF today as facilitated by CSW.   Expected Discharge Date:                  Expected Discharge Plan:  Skilled Nursing Facility  In-House Referral:  Clinical Social Work  Discharge planning Services  CM Consult  Post Acute Care Choice:  NA Choice offered to:  NA  DME Arranged:  N/A DME Agency:  NA  HH Arranged:  NA HH Agency:  NA  Status of Service:  Completed, signed off  If discussed at Long Length of Stay Meetings, dates discussed:    Additional Comments:  Lawerance SabalDebbie Sufian Ravi, RN 03/16/2016, 3:14 PM

## 2016-03-16 NOTE — Progress Notes (Signed)
Patient discharging home with home health.  CSW signing off.  Osborne Cascoadia Torie Towle LCSWA [phone]

## 2016-03-16 NOTE — Progress Notes (Signed)
PROGRESS NOTE    Latasha Wang  ZOX:[id number] DOB: 01/24/1941 DOA: 03/08/2016 PCP: Oliver BarreJames John, MD Brief Narrative:Latasha Wang is a pleasant 75 y.o. AF with medical history significant for diabetes, hypertension, fatty liver disease, depression, anxiety recent extraction of 14 teeth on 9/11 by Dr.Edward Lorin PicketScott presented to the emergency department with chief complaint of fever. Initial evaluation reveals a temperature of 103.2 orally, elevated lactic acid, hyponatremia, acute kidney injury and acute encephalopathy. Started on IV Vanc/Zosyn, Blood Cx pending, ID consulted, mentation improving  Assessment & Plan:   Sepsis/FUO -likely due to recent extraction of 14teeth, some with dental abscess prior to surgery  -Stopped Amoxicillin on admission, started IV Zosyn and Vancomycin on 9/18 -CT maxillofacial without abscess -ID consulted and Abx changed to IV Unasyn -mentation improving but continued to have fevers and hence changed to Primaxin, now afebrile, back to baseline -Blood cultures-NGTD -2D ECHO-unremarkable-lactate cleared -ID consult per Dr.Hatcher appreciated, changed to Augmentin yesterday, remains afebrile -ambulate, DC planning, CIR consult-needs FU with ORal Surgeon Dr.Edward Scott-needs sutures removed  Toxic metabolic encephalopathy -due to above infection, improving with Abx -no meningeal signs, no neck stiffness -CT head unremarkable  -Mentation improved now -resumed klonopin and Wellbutrin due to anxiety  DM -stable, SSI for now  HTN -stable, holding HCTZ/Amlodipine and ARB  Anxiety/Depression -resumed klonopin, wellbutrin and prozac  AKI -due to sepsis, dehydration with Diuretic and ARB use -resolved, continue IVF  Hyponatremia -due to dehydration/sepsis -improved, cut down IVF  DVT prophylaxis: Hep SQ Code Status:Full COde Family Communication:no family at bedside today Disposition Plan:CIR vs SNF   Antimicrobials:Amoxicliin  9/17-9/18  Vanc/Zosyn 9/18->   Subjective: More alert,  Fever curve much improved, no pain anywhere  Objective: Vitals:   03/14/16 2056 03/15/16 0645 03/15/16 2111 03/16/16 0542  BP: (!) 154/71 (!) 156/66 (!) 147/65 (!) 159/72  Pulse: 63 65 (!) 58 64  Resp: 18 20 16 20   Temp: 98.9 F (37.2 C) 98.2 F (36.8 C) 98.2 F (36.8 C) 98 F (36.7 C)  TempSrc: Oral Oral    SpO2: 99% 100% 90% 95%  Weight:      Height:        Intake/Output Summary (Last 24 hours) at 03/16/16 1100 Last data filed at 03/16/16 [id number]  Gross per 24 hour  Intake          2233.75 ml  Output             2445 ml  Net          -211.25 ml   Filed Weights   03/08/16 1207 03/09/16 0402 03/14/16 0607  Weight: 77.1 kg (170 lb) 75.7 kg (166 lb 14.2 oz) 69.8 kg (153 lb 14.1 oz)    Examination:  General exam:  AAOx3, no distress HEENT: Edentulous,no swelling of gums noted, sutures noted Respiratory system: Clear to auscultation. Respiratory effort normal. Cardiovascular system: S1 & S2 heard, RRR. No JVD, murmurs, rubs, gallops or clicks. No pedal edema. Gastrointestinal system: Abdomen is nondistended, soft and nontender. No organomegaly or masses felt. Normal bowel sounds heard. Central nervous system:non focal Extremities: Symmetric 5 x 5 power. Skin: No rashes, lesions or ulcers Psychiatry: appropriate for the most part    Data Reviewed: I have personally reviewed following labs and imaging studies  CBC:  Recent Labs Lab 03/10/16 0443 03/11/16 0630 03/12/16 0515 03/16/16 0707  WBC 8.3 8.0 7.5 7.2  HGB 10.1* 9.9* 9.5* 10.9*  HCT 32.3* 30.5* 29.8* 34.5*  MCV 89.5 88.9 89.0 89.8  PLT 180 182 161 245   Basic Metabolic Panel:  Recent Labs Lab 03/10/16 0443 03/11/16 0630 03/12/16 0515 03/12/16 1427 03/12/16 2313 03/14/16 0353 03/15/16 0520 03/16/16 0707  NA 138 138 139  --   --  141  --  140  K 3.2* 3.0* 2.8* 2.8* 3.2* 3.4*  --  3.2*  CL 106 108 107  --   --  104  --  106  CO2 23 22  22   --   --  26  --  26  GLUCOSE 121* 99 109*  --   --  109*  --  119*  BUN 9 <5* 6  --   --  <5*  --  <5*  CREATININE 1.02* 0.81 0.86  --   --  0.73 0.68 0.63  CALCIUM 8.2* 8.2* 8.0*  --   --  8.8*  --  8.8*  MG  --   --   --  1.9  --   --   --   --    GFR: Estimated Creatinine Clearance (by C-G formula based on SCr of 0.63 mg/dL) Female: 16.1 mL/min Female: 61.6 mL/min Liver Function Tests:  Recent Labs Lab 03/16/16 0707  AST 27  ALT 32  ALKPHOS 42  BILITOT 1.6*  PROT 6.2*  ALBUMIN 3.4*   No results for input(s): LIPASE, AMYLASE in the last 168 hours. No results for input(s): AMMONIA in the last 168 hours. Coagulation Profile: No results for input(s): INR, PROTIME in the last 168 hours. Cardiac Enzymes: No results for input(s): CKTOTAL, CKMB, CKMBINDEX, TROPONINI in the last 168 hours. BNP (last 3 results) No results for input(s): PROBNP in the last 8760 hours. HbA1C: No results for input(s): HGBA1C in the last 72 hours. CBG:  Recent Labs Lab 03/15/16 0802 03/15/16 1130 03/15/16 1631 03/15/16 2108 03/16/16 0757  GLUCAP 113* 122* 128* 138* 117*   Lipid Profile: No results for input(s): CHOL, HDL, LDLCALC, TRIG, CHOLHDL, LDLDIRECT in the last 72 hours. Thyroid Function Tests: No results for input(s): TSH, T4TOTAL, FREET4, T3FREE, THYROIDAB in the last 72 hours. Anemia Panel: No results for input(s): VITAMINB12, FOLATE, FERRITIN, TIBC, IRON, RETICCTPCT in the last 72 hours. Urine analysis:    Component Value Date/Time   COLORURINE YELLOW 03/09/2016 1525   APPEARANCEUR CLEAR 03/09/2016 1525   LABSPEC 1.024 03/09/2016 1525   PHURINE 5.5 03/09/2016 1525   GLUCOSEU NEGATIVE 03/09/2016 1525   GLUCOSEU NEGATIVE 12/11/2015 0838   HGBUR SMALL (A) 03/09/2016 1525   BILIRUBINUR NEGATIVE 03/09/2016 1525   KETONESUR 15 (A) 03/09/2016 1525   PROTEINUR NEGATIVE 03/09/2016 1525   UROBILINOGEN 0.2 12/11/2015 0838   NITRITE NEGATIVE 03/09/2016 1525   LEUKOCYTESUR SMALL  (A) 03/09/2016 1525   Sepsis Labs: @LABRCNTIP (procalcitonin:4,lacticidven:4)  ) Recent Results (from the past 240 hour(s))  Culture, blood (Routine x 2)     Status: None   Collection Time: 03/08/16 12:22 PM  Result Value Ref Range Status   Specimen Description BLOOD LEFT WRIST  Final   Special Requests BOTTLES DRAWN AEROBIC AND ANAEROBIC  5CC  Final   Culture NO GROWTH 5 DAYS  Final   Report Status 03/13/2016 FINAL  Final  Culture, blood (Routine x 2)     Status: None   Collection Time: 03/08/16 12:25 PM  Result Value Ref Range Status   Specimen Description BLOOD RIGHT WRIST  Final   Special Requests IN PEDIATRIC BOTTLE  3CC  Final   Culture NO GROWTH 5 DAYS  Final  Report Status 03/13/2016 FINAL  Final  Urine culture     Status: None   Collection Time: 03/08/16  2:52 PM  Result Value Ref Range Status   Specimen Description URINE, RANDOM  Final   Special Requests NONE  Final   Culture NO GROWTH  Final   Report Status 03/09/2016 FINAL  Final  Stool culture (children & immunocomp patients)     Status: None   Collection Time: 03/08/16  9:14 PM  Result Value Ref Range Status   Salmonella/Shigella Screen Final report  Final   Campylobacter Culture Final report  Final   E coli, Shiga toxin Assay Negative Negative Final    Comment: (NOTE) Performed At: [facility] [address] [place], [place] [id number] Mila Homer MD WU:[id number]   STOOL CULTURE REFLEX - RSASHR     Status: None   Collection Time: 03/08/16  9:14 PM  Result Value Ref Range Status   Stool Culture result 1 (RSASHR) Comment  Final    Comment: (NOTE) No Salmonella or Shigella recovered. Performed At: [facility] [address] [place], [place] [id number] Mila Homer MD VH:[id number]   STOOL CULTURE Reflex - CMPCXR     Status: None   Collection Time: 03/08/16  9:14 PM  Result Value Ref Range Status   Stool Culture result 1 (CMPCXR) Comment  Final    Comment: (NOTE) No  Campylobacter species isolated. Performed At: [facility] [address] [place], [place] [id number] Mila Homer MD UV:[id number]   Culture, blood (routine x 2)     Status: None   Collection Time: 03/09/16  6:35 AM  Result Value Ref Range Status   Specimen Description BLOOD LEFT ARM  Final   Special Requests BOTTLES DRAWN AEROBIC AND ANAEROBIC 5CC  Final   Culture NO GROWTH 5 DAYS  Final   Report Status 03/14/2016 FINAL  Final  Culture, blood (routine x 2)     Status: None   Collection Time: 03/09/16  6:54 AM  Result Value Ref Range Status   Specimen Description BLOOD LEFT HAND  Final   Special Requests BOTTLES DRAWN AEROBIC AND ANAEROBIC 5CC  Final   Culture NO GROWTH 5 DAYS  Final   Report Status 03/14/2016 FINAL  Final         Radiology Studies: No results found.      Scheduled Meds: . amoxicillin-clavulanate  1 tablet Oral Q12H  . buPROPion  150 mg Oral Daily  . clonazePAM  0.5 mg Oral BID  . enoxaparin (LOVENOX) injection  40 mg Subcutaneous Q24H  . FLUoxetine  40 mg Oral QHS  . insulin aspart  0-5 Units Subcutaneous QHS  . insulin aspart  0-9 Units Subcutaneous TID WC  . rosuvastatin  10 mg Oral Daily   Continuous Infusions: . sodium chloride 75 mL/hr at 03/16/16 0439     LOS: 7 days    Time spent:    Zannie Cove, MD [facility] Pager [phone]  If 7PM-7AM, please contact night-coverage [email or web address] Password [email or web address] 03/16/2016, 11:00 AM

## 2016-03-16 NOTE — Progress Notes (Signed)
Patient will DC to: [facility] Anticipated DC date: 03/16/16 Family notified: Daughter Transport by: Sharin MonsPTAR   Per MD patient ready for DC to [facility]. RN, patient, patient's family, and facility notified of DC. Discharge Summary sent to facility. RN given number for report. DC packet on chart. Ambulance transport requested for patient.   CSW signing off.  Cristobal GoldmannNadia Orenthal Debski, [place] Clinical Social Worker [phone]

## 2016-03-17 ENCOUNTER — Encounter: Payer: Self-pay | Admitting: Adult Health

## 2016-03-17 ENCOUNTER — Non-Acute Institutional Stay (SKILLED_NURSING_FACILITY): Payer: Medicare Other | Admitting: Adult Health

## 2016-03-17 DIAGNOSIS — E118 Type 2 diabetes mellitus with unspecified complications: Secondary | ICD-10-CM | POA: Diagnosis not present

## 2016-03-17 DIAGNOSIS — I10 Essential (primary) hypertension: Secondary | ICD-10-CM | POA: Diagnosis not present

## 2016-03-17 DIAGNOSIS — E785 Hyperlipidemia, unspecified: Secondary | ICD-10-CM | POA: Diagnosis not present

## 2016-03-17 DIAGNOSIS — A419 Sepsis, unspecified organism: Secondary | ICD-10-CM | POA: Diagnosis not present

## 2016-03-17 NOTE — Progress Notes (Signed)
Patient ID: Latasha Wang, female   DOB: 05/28/1941, 75 y.o.   MRN: [id number]    Location:   [facility] Room Number: 128-B Place of Service:  SNF (31)   CODE STATUS: Full Code  No Known Allergies  Chief Complaint  Patient presents with  . Hospitalization Follow-up    Hospital Follow up    HPI:  She has been hospitalized for sepsis due to suspected dental space infection. She was treated with IV abt and converted to augmentin for 7 more days. She is here for short term rehab with her goal to return back home. She is not voicing any complaints at this time. There are no nursing concerns at this time.    Past Medical History:  Diagnosis Date  . ANXIETY   . DEPRESSION   . Diabetes mellitus   . FATTY LIVER DISEASE   . Gastroparesis   . GERD   . HYPERLIPIDEMIA   . HYPERTENSION   . LATERAL EPICONDYLITIS, RIGHT     Past Surgical History:  Procedure Laterality Date  . CATARACT EXTRACTION, BILATERAL    . TOTAL ELBOW ARTHROPLASTY  02/19/2012   Procedure: TOTAL ELBOW ARTHROPLASTY;  Surgeon: Budd Palmer, MD;  Location: [facility] OR;  Service: Orthopedics;  Laterality: Left;    Social History   Social History  . Marital status: Divorced    Spouse name: N/A  . Number of children: 6  . Years of education: N/A   Occupational History  .  Retired   Social History Main Topics  . Smoking status: Never Smoker  . Smokeless tobacco: Never Used  . Alcohol use No  . Drug use: No  . Sexual activity: Not on file   Other Topics Concern  . Not on file   Social History Narrative  . No narrative on file   History reviewed. No pertinent family history.    VITAL SIGNS BP 132/72   Pulse 78   Temp 98.4 F (36.9 C) (Oral)   Resp 20   Ht 4\' 10"  (1.473 m)   Wt 148 lb 6 oz (67.3 kg)   SpO2 98%   BMI 31.01 kg/m   Patient's Medications  New Prescriptions   No medications on file  Previous Medications   ACETAMINOPHEN (TYLENOL) 500 MG TABLET    Take 1-2 tablets  (500-1,000 mg total) by mouth every 6 (six) hours as needed for pain or fever. For pain   ALBUTEROL (PROVENTIL HFA;VENTOLIN HFA) 108 (90 BASE) MCG/ACT INHALER    Inhale 2 puffs into the lungs every 6 (six) hours as needed for wheezing.   AMLODIPINE (NORVASC) 10 MG TABLET    TAKE 1 TABLET BY MOUTH EVERY DAY   AMOXICILLIN-CLAVULANATE (AUGMENTIN) 875-125 MG TABLET    Take 1 tablet by mouth every 12 (twelve) hours. For 7days   BUPROPION (WELLBUTRIN XL) 150 MG 24 HR TABLET    Take 1 tablet (150 mg total) by mouth daily.   CLONAZEPAM (KLONOPIN) 0.5 MG TABLET    TAKE 1 TABLET BY MOUTH TWICE A DAY AS NEEDED   CRESTOR 10 MG TABLET    TAKE 1 TABLET (10 MG TOTAL) BY MOUTH DAILY.   FLUOXETINE (PROZAC) 40 MG CAPSULE    TAKE ONE CAPSULE BY MOUTH DAILY   HYDROCHLOROTHIAZIDE (MICROZIDE) 12.5 MG CAPSULE    TAKE ONE CAPSULE BY MOUTH EVERY DAY   KETOCONAZOLE (NIZORAL) 2 % SHAMPOO    Apply 1 application topically every Friday.   MECLIZINE (ANTIVERT) 12.5 MG TABLET  Take 1-2 tablets (12.5-25 mg total) by mouth every 6 (six) hours as needed. For dizziness   METFORMIN (GLUCOPHAGE-XR) 500 MG 24 HR TABLET    TAKE 1 TABLET BY MOUTH TWICE A DAY   PIOGLITAZONE (ACTOS) 45 MG TABLET    TAKE 1 TABLET (45 MG TOTAL) BY MOUTH DAILY.  Modified Medications   No medications on file  Discontinued Medications   No medications on file     SIGNIFICANT DIAGNOSTIC EXAMS  03-08-16: chest x-ray: Enlargement of cardiac silhouette. Minimal RIGHT basilar atelectasis. Aortic atherosclerosis.  03-08-16: maxillofacial ct scan: Post extraction of multiple teeth more notable involving lower teeth and greater on the right. In the extraction sites, there is poorly defined soft tissue and gas which may represent expected changes although postoperative inflammation not excluded in proper clinical setting. No deep drainable abscess.   03-09-16: ct of head: No acute intracranial abnormality. Minimal atrophy for age  22-19-17: 2-d echo: - Left  ventricle: The cavity size was normal. Wall thickness was increased in a pattern of mild LVH. Systolic function was normal. The estimated ejection fraction was in the range of 60% to 65%. Wall motion was normal; there were no regional wall motion abnormalities. Features are consistent with a pseudonormal left ventricular filling pattern, with concomitant abnormal relaxation and increased filling pressure (grade 2 diastolic dysfunction). Doppler parameters are consistent with high ventricular filling pressure. - Aortic valve: Transvalvular velocity was within the normal range. There was no stenosis. There was no regurgitation. - Mitral valve: Transvalvular velocity was within the normal range. There was no evidence for stenosis. There was no regurgitation. - Left atrium: The atrium was mildly dilated. - Right ventricle: The cavity size was normal. Wall thickness was normal. Systolic function was normal. - Tricuspid valve: There was mild regurgitation. - Pulmonic valve: Peak gradient (S): 12 mm Hg. - Pulmonary arteries: Systolic pressure was within the normal   range. PA peak pressure: 17 mm Hg (S).   03-12-16: mri of brain: Motion degraded examination demonstrating atrophy without acute intracranial findings. No abnormal postcontrast enhancement or restricted diffusion to suggest intracranial infection/inflammatory process. No significant sinus disease or parameningeal focus of infection.   LABS REVIEWED:   03-08-16: wbc 9.1; hgb 11.2; hct 35.0; mcv 90.0; plt 196; glucose 115; bun 16; creat 1.16; k+ 3.7; na++ 131; total bili 2.1 albumin 3.8; hgb a1c 6.3; urine and blood culture: no growth 03-09-16; sed rate 35 03-10-16: uric acid 2.7 03-12-16: wbc 7.5; hgb 9.5; hct 29.8;mcv 89.0; plt 161; glucose 109; bun 6; creat 0.86; k+ 2.8; na++ 139; mag 1.9 03-14-16:glucose 109; bun <5; creat 0.73; k+ 3.4; na++ 141 03-16-16: wbc 7.2; hgb 10.9; hct 34.5; mcv 89.8 ;plt 245; glucose 119; bun <5; creat 0.63; k+ 3.2;  na++ 140; total bili 1.6; albumin 3.4     Review of Systems  Constitutional: Negative for malaise/fatigue.  Respiratory: Negative for cough and shortness of breath.   Cardiovascular: Negative for chest pain, palpitations and leg swelling.  Gastrointestinal: Negative for abdominal pain, constipation and heartburn.  Musculoskeletal: Negative for back pain, joint pain and myalgias.  Skin: Negative.   Neurological: Negative for dizziness.  Psychiatric/Behavioral: The patient is not nervous/anxious.     Physical Exam  Constitutional: No distress.  Eyes: Conjunctivae are normal.  Neck: Neck supple. No JVD present. No thyromegaly present.  Cardiovascular: Normal rate, regular rhythm and intact distal pulses.   Respiratory: Effort normal and breath sounds normal. No respiratory distress. She has no wheezes.  GI:  Soft. Bowel sounds are normal. She exhibits no distension. There is no tenderness.  Musculoskeletal: She exhibits no edema.  Able to move all extremities   Lymphadenopathy:    She has no cervical adenopathy.  Neurological: She is alert.  Skin: Skin is warm and dry. She is not diaphoretic.  Psychiatric: She has a normal mood and affect.    ASSESSMENT/ PLAN:  1. Diabetes: hgb a1c is 6.3; will continue metformin xr 500 mg twice daily and actos 45 mg daily   2. Hypertension: will continue norvasc 10 mg daily and hctz 12.5 mg daily   3. Dyslipidemia: will continue crestor 10 mg daily   4. Depression with anxiety: will continue wellbutrin xl 150 mg daily; prozac 40 mg daily and klonopin 0.5 mg twice daily as needed  5.  Sepsis due to dental space infection: will complete augmentin and will monitor her status  Will check cmp   Time spent with patient  50  minutes >50% time spent counseling; reviewing medical record; tests; labs; and developing future plan of care   MD is aware of resident's narcotic use and is in agreement with current plan of care. We will attempt to wean  resident as apropriate   Synthia Innocent NP [facility]  Contact [phone] Monday through Friday 8am- 5pm  After hours call [phone]

## 2016-03-19 ENCOUNTER — Other Ambulatory Visit: Payer: Self-pay | Admitting: *Deleted

## 2016-03-19 ENCOUNTER — Non-Acute Institutional Stay (SKILLED_NURSING_FACILITY): Payer: Medicare Other | Admitting: Internal Medicine

## 2016-03-19 ENCOUNTER — Encounter: Payer: Self-pay | Admitting: Internal Medicine

## 2016-03-19 DIAGNOSIS — F418 Other specified anxiety disorders: Secondary | ICD-10-CM

## 2016-03-19 DIAGNOSIS — K122 Cellulitis and abscess of mouth: Secondary | ICD-10-CM | POA: Diagnosis not present

## 2016-03-19 DIAGNOSIS — E876 Hypokalemia: Secondary | ICD-10-CM | POA: Diagnosis not present

## 2016-03-19 DIAGNOSIS — D6489 Other specified anemias: Secondary | ICD-10-CM

## 2016-03-19 DIAGNOSIS — I10 Essential (primary) hypertension: Secondary | ICD-10-CM

## 2016-03-19 DIAGNOSIS — E785 Hyperlipidemia, unspecified: Secondary | ICD-10-CM

## 2016-03-19 DIAGNOSIS — R17 Unspecified jaundice: Secondary | ICD-10-CM

## 2016-03-19 DIAGNOSIS — E118 Type 2 diabetes mellitus with unspecified complications: Secondary | ICD-10-CM | POA: Diagnosis not present

## 2016-03-19 DIAGNOSIS — K760 Fatty (change of) liver, not elsewhere classified: Secondary | ICD-10-CM

## 2016-03-19 MED ORDER — CLONAZEPAM 0.5 MG PO TABS: ORAL_TABLET | ORAL | 0 refills | Status: DC

## 2016-03-19 NOTE — Telephone Encounter (Signed)
[facility] #[phone] Fax:[phone]  

## 2016-03-19 NOTE — Progress Notes (Signed)
Patient ID: Tawana Scale, female   DOB: 10/26/1940, 75 y.o.   MRN: [id number]    DATE:  03/19/2016  Location:    [facility] Room Number: 128 B Place of Service: SNF (31)   Extended Emergency Contact Information Primary Emergency Contact: Austin,Linda Address: [address]          [place], [place] [address] D[place] of [place] Home Phone: [phone] Mobile Phone: [phone] Relation: Daughter  Advanced Directive information Does patient have an advance directive?: Yes, Does patient want to make changes to advanced directive?: No - Patient declined  Chief Complaint  Patient presents with  . New Admit To SNF    HPI:  75 yo female seen today as a new admission into SNF following hospital stay for acute encephalopathy, sepsis, suspected dental space infection, DM, anxiety, HTN, GERD, fever, AKI, hyponatremia, elevated lactic acid, NASH, hypokalemia. He had 14 teeth extracted by Dr  Geryl Councilman on 9/11th and presented to the ED with Tm 103.24F, lactic acid level 2.02, Na 131, Cr 1.16--> 1.47 and acute encephalopathy. He was given IV zosyn and vanco on admission. ID consulted and abx changed to IV unasyn and primaxin. BC neg. 2D echo unremarkable. abx changed to po Augmentin. CT head no acute process. Mentation improved with abx tx. A1c 6.3%; Na 140; K+ 3.2; Cr 0.63; Tbili 2.1-->1.6; Hgb 10.9; albumin 3.4 at d/c. She presents to SNF for short term rehab.  Today she reports no concerns. No f/c. No pain. No dysphagia or odynophagia. She will need total 7 days of augmentin. No nursing issues. No falls. Appetite ok. Sleeps well.  DM - controlled. A1c  6.3%. Takes metformin xr 500 mg twice daily and actos 45 mg daily   Hypertension - BP stable on norvasc 10 mg daily and hctz 12.5 mg daily   Hyperlipidemia - stable on crestor 10 mg daily. LDL 80  Depression with anxiety - mood stable on wellbutrin xl 150 mg daily; prozac 40 mg daily and klonopin 0.5 mg twice daily as  needed  Anemia - Hgb stable at 10.9  GERD with hx gastroparesis - stable off meds  Past Medical History:  Diagnosis Date  . ANXIETY   . DEPRESSION   . Diabetes mellitus   . FATTY LIVER DISEASE   . Gastroparesis   . GERD   . HYPERLIPIDEMIA   . HYPERTENSION   . LATERAL EPICONDYLITIS, RIGHT     Past Surgical History:  Procedure Laterality Date  . CATARACT EXTRACTION, BILATERAL    . TOTAL ELBOW ARTHROPLASTY  02/19/2012   Procedure: TOTAL ELBOW ARTHROPLASTY;  Surgeon: Budd Palmer, MD;  Location: [facility] OR;  Service: Orthopedics;  Laterality: Left;    Patient Care Team: Corwin Levins, MD as PCP - General  Social History   Social History  . Marital status: Divorced    Spouse name: N/A  . Number of children: 6  . Years of education: N/A   Occupational History  .  Retired   Social History Main Topics  . Smoking status: Never Smoker  . Smokeless tobacco: Never Used  . Alcohol use No  . Drug use: No  . Sexual activity: Not on file   Other Topics Concern  . Not on file   Social History Narrative  . No narrative on file     reports that she has never smoked. She has never used smokeless tobacco. She reports that she does not drink alcohol or use drugs.  History  reviewed. Neg FHx for DM, HTN No family status information on file.    Immunization History  Administered Date(s) Administered  . Influenza Whole 05/06/2007, 03/26/2008  . Influenza-Unspecified 03/22/2014  . PPD Test 03/18/2016  . Pneumococcal Conjugate-13 06/13/2013, 12/12/2013  . Pneumococcal Polysaccharide-23 12/13/2006  . Td 12/17/2008    No Known Allergies  Medications: Patient's Medications  New Prescriptions   No medications on file  Previous Medications   ACETAMINOPHEN (TYLENOL) 500 MG TABLET    Take 1-2 tablets (500-1,000 mg total) by mouth every 6 (six) hours as needed for pain or fever. For pain   ALBUTEROL (PROVENTIL HFA;VENTOLIN HFA) 108 (90 BASE) MCG/ACT INHALER    Inhale 2 puffs  into the lungs every 6 (six) hours as needed for wheezing.   AMLODIPINE (NORVASC) 10 MG TABLET    TAKE 1 TABLET BY MOUTH EVERY DAY   AMOXICILLIN-CLAVULANATE (AUGMENTIN) 875-125 MG TABLET    Take 1 tablet by mouth every 12 (twelve) hours. For 7days   BUPROPION (WELLBUTRIN XL) 150 MG 24 HR TABLET    Take 1 tablet (150 mg total) by mouth daily.   CRESTOR 10 MG TABLET    TAKE 1 TABLET (10 MG TOTAL) BY MOUTH DAILY.   FLUOXETINE (PROZAC) 40 MG CAPSULE    TAKE ONE CAPSULE BY MOUTH DAILY   HYDROCHLOROTHIAZIDE (MICROZIDE) 12.5 MG CAPSULE    TAKE ONE CAPSULE BY MOUTH EVERY DAY   KETOCONAZOLE (NIZORAL) 2 % SHAMPOO    Apply 1 application topically every Friday.   MECLIZINE (ANTIVERT) 12.5 MG TABLET    Take 1-2 tablets (12.5-25 mg total) by mouth every 6 (six) hours as needed. For dizziness   METFORMIN (GLUCOPHAGE-XR) 500 MG 24 HR TABLET    TAKE 1 TABLET BY MOUTH TWICE A DAY   PIOGLITAZONE (ACTOS) 45 MG TABLET    TAKE 1 TABLET (45 MG TOTAL) BY MOUTH DAILY.  Modified Medications   Modified Medication Previous Medication   CLONAZEPAM (KLONOPIN) 0.5 MG TABLET clonazePAM (KLONOPIN) 0.5 MG tablet      Take one tablet by mouth every 12 hours as needed for anxiety    Take 0.5 mg by mouth 2 (two) times daily as needed for anxiety.  Discontinued Medications   CLONAZEPAM (KLONOPIN) 0.5 MG TABLET    TAKE 1 TABLET BY MOUTH TWICE A DAY AS NEEDED    Review of Systems  HENT: Positive for dental problem. Negative for facial swelling.   All other systems reviewed and are negative.   Vitals:   03/19/16 1002  BP: 139/82  Pulse: 77  Resp: 18  Temp: 97.9 F (36.6 C)  TempSrc: Oral  SpO2: 98%  Weight: 148 lb 9.6 oz (67.4 kg)  Height: 4\' 10"  (1.473 m)   Body mass index is 31.06 kg/m.  Physical Exam  Constitutional: She is oriented to person, place, and time. She appears well-developed.  Frail appearing in NAD sitting in w/c  HENT:  Mouth/Throat: Oropharynx is clear and moist. No oropharyngeal exudate.    Sutures intact in right lower gum area. Min swelling but no d/c or redness. (+) TTP  Eyes: Pupils are equal, round, and reactive to light. No scleral icterus.  Neck: Neck supple. Carotid bruit is not present. No tracheal deviation present.  Cardiovascular: Regular rhythm and intact distal pulses.  Tachycardia present.  Exam reveals no gallop and no friction rub.   Murmur heard.  Systolic murmur is present with a grade of 1/6  No LE edema b/l. no calf TTP.  Pulmonary/Chest: Effort normal and breath sounds normal. No stridor. No respiratory distress. She has no wheezes. She has no rales.  Abdominal: Soft. Bowel sounds are normal. She exhibits no distension and no mass. There is no hepatomegaly. There is no tenderness. There is no rebound and no guarding.  Musculoskeletal: She exhibits edema.  Lymphadenopathy:    She has no cervical adenopathy.  Neurological: She is alert and oriented to person, place, and time.  Skin: Skin is warm and dry. No rash noted.  Psychiatric: She has a normal mood and affect. Her behavior is normal. Judgment and thought content normal.     Labs reviewed: Admission on 03/08/2016, Discharged on 03/16/2016  No results displayed because visit has over 200 results.  CBC Latest Ref Rng & Units 03/16/2016 03/12/2016 03/11/2016  WBC 4.0 - 10.5 K/uL 7.2 7.5 8.0  Hemoglobin 12.0 - 15.0 g/dL 10.9(L) 9.5(L) 9.9(L)  Hematocrit 36.0 - 46.0 % 34.5(L) 29.8(L) 30.5(L)  Platelets 150 - 400 K/uL 245 161 182   CMP Latest Ref Rng & Units 03/16/2016 03/15/2016 03/14/2016  Glucose 65 - 99 mg/dL 161(W) - 960(A)  BUN 6 - 20 mg/dL <5(W) - <0(J)  Creatinine 0.44 - 1.00 mg/dL 8.11 9.14 7.82  Sodium 135 - 145 mmol/L 140 - 141  Potassium 3.5 - 5.1 mmol/L 3.2(L) - 3.4(L)  Chloride 101 - 111 mmol/L 106 - 104  CO2 22 - 32 mmol/L 26 - 26  Calcium 8.9 - 10.3 mg/dL 9.5(A) - 8.8(L)  Total Protein 6.5 - 8.1 g/dL 6.2(L) - -  Total Bilirubin 0.3 - 1.2 mg/dL 2.1(H) - -  Alkaline Phos 38 - 126 U/L  42 - -  AST 15 - 41 U/L 27 - -  ALT 14 - 54 U/L 32 - -   Lipid Panel     Component Value Date/Time   CHOL 164 12/11/2015 0838   TRIG 138.0 12/11/2015 0838   HDL 56.50 12/11/2015 0838   CHOLHDL 3 12/11/2015 0838   VLDL 27.6 12/11/2015 0838   LDLCALC 80 12/11/2015 0838   LDLDIRECT 85.1 08/19/2006 0759   Lab Results  Component Value Date   HGBA1C 6.3 (H) 03/08/2016       Dg Chest 2 View  Result Date: 03/11/2016 CLINICAL DATA:  Cough.  Hypertension. EXAM: CHEST  2 VIEW COMPARISON:  March 09, 2016 FINDINGS: There is no edema or consolidation. Heart is mildly enlarged with pulmonary vascularity within normal limits. There is atherosclerotic calcification in the aorta. No adenopathy. There is evidence of old trauma involving the proximal left humerus, stable. Patient has had elbow replacement on the left. IMPRESSION: No edema or consolidation. Mild cardiomegaly. Aortic atherosclerosis. Electronically Signed   By: Bretta Bang III M.D.   On: 03/11/2016 10:24   Dg Chest 2 View  Result Date: 03/08/2016 CLINICAL DATA:  Chest pain for 2 years, hypertension, diabetes mellitus EXAM: CHEST  2 VIEW COMPARISON:  08/11/2012 FINDINGS: Enlargement of cardiac silhouette. Calcified tortuous aorta. Pulmonary vascularity and mediastinal contours otherwise normal. Minimal RIGHT basilar atelectasis. Lungs otherwise clear. No pleural effusion or pneumothorax. Diffuse osseous demineralization. IMPRESSION: Enlargement of cardiac silhouette. Minimal RIGHT basilar atelectasis. Aortic atherosclerosis. Electronically Signed   By: Ulyses Southward M.D.   On: 03/08/2016 13:19   Ct Head Wo Contrast  Result Date: 03/09/2016 CLINICAL DATA:  Fever have recent dental extractions possible sepsis EXAM: CT HEAD WITHOUT CONTRAST TECHNIQUE: Contiguous axial images were obtained from the base of the skull through the vertex without intravenous contrast. COMPARISON:  CT brain scan of 10/30/2011 FINDINGS: Brain: The ventricular  system is unchanged in size and configuration compared to the prior CT, and there is only mild cortical atrophy present. The septum remains midline in position. The fourth ventricle and basilar cisterns are unchanged. Benign-appearing basal ganglial calcifications are present bilaterally. No hemorrhage, mass lesion, or acute infarction is seen. Vascular: On this unenhanced study, no vascular abnormality is noted. Skull: On bone window images, no calvarial abnormality is seen. Sinuses/Orbits: The paranasal sinuses appear well pneumatized. No sinusitis is noted. Other: None IMPRESSION: No acute intracranial abnormality.  Minimal atrophy for age Electronically Signed   By: Dwyane Dee M.D.   On: 03/09/2016 11:34   Mr Laqueta Jean BJ Contrast  Result Date: 03/12/2016 CLINICAL DATA:  75 y.o. female admitted on 03/08/2016 with rule out meningitis - patient has fever of unknown origin and possible dental space infection. Patient is agitated and confused with neck pain EXAM: MRI HEAD WITHOUT AND WITH CONTRAST TECHNIQUE: Multiplanar, multiecho pulse sequences of the brain and surrounding structures were obtained without and with intravenous contrast. CONTRAST:  15mL MULTIHANCE GADOBENATE DIMEGLUMINE 529 MG/ML IV SOLN COMPARISON:  CT head 03/09/2016. FINDINGS: The patient was unable to remain motionless for the exam. Small or subtle lesions could be overlooked. Brain: No evidence for acute infarction, hemorrhage, mass lesion, hydrocephalus, or extra-axial fluid. Mild atrophy. Minor white matter disease, nonspecific. Post infusion, no abnormal enhancement of the brain or meninges. Vascular: Normal flow voids. Skull and upper cervical spine: Partial empty sella. No midline abnormality. Sinuses/Orbits: No orbital masses or proptosis. Globes appear symmetric. Sinuses appear well aerated, without evidence for air-fluid level. Other: No nasopharyngeal pathology or mastoid fluid. Scalp and other visualized extracranial soft tissues  grossly unremarkable. IMPRESSION: Motion degraded examination demonstrating atrophy without acute intracranial findings. No abnormal postcontrast enhancement or restricted diffusion to suggest intracranial infection/inflammatory process. No significant sinus disease or parameningeal focus of infection. Electronically Signed   By: Elsie Stain M.D.   On: 03/12/2016 17:59   Ct Maxillofacial W Contrast  Result Date: 03/08/2016 CLINICAL DATA:  75 year old female post teeth extraction 6 days ago. Fever. Initial encounter. EXAM: CT MAXILLOFACIAL WITH CONTRAST TECHNIQUE: Multidetector CT imaging of the maxillofacial structures was performed with intravenous contrast. Multiplanar CT image reconstructions were also generated. A small metallic BB was placed on the right temple in order to reliably differentiate right from left. CONTRAST:  75mL ISOVUE-300 IOPAMIDOL (ISOVUE-300) INJECTION 61% COMPARISON:  None. FINDINGS: Osseous: Post extraction of multiple teeth more notable involving lower teeth and greater on the right. In the extraction sites, there is poorly defined soft tissue and gas which may represent expected changes although postoperative inflammation not excluded in proper clinical setting. Orbits: Negative. Sinuses: Minimal mucosal thickening right frontal sinus and ethmoid sinus air cells. Soft tissues: No deep drainable abscess. Limited intracranial: Intracranial vascular calcifications. Hyperostosis frontalis interna. IMPRESSION: Post extraction of multiple teeth more notable involving lower teeth and greater on the right. In the extraction sites, there is poorly defined soft tissue and gas which may represent expected changes although postoperative inflammation not excluded in proper clinical setting. No deep drainable abscess. Electronically Signed   By: Lacy Duverney M.D.   On: 03/08/2016 21:02   Portable Chest 1 View  Result Date: 03/09/2016 CLINICAL DATA:  Fever EXAM: PORTABLE CHEST 1 VIEW  COMPARISON:  03/08/2016 FINDINGS: Cardiomegaly with vascular congestion. Low lung volumes. No confluent opacities or overt edema. No effusions or acute bony abnormality. IMPRESSION: Cardiomegaly, vascular congestion.  Low lung volumes. Electronically Signed   By: Charlett Nose M.D.   On: 03/09/2016 07:53     Assessment/Plan   ICD-9-CM ICD-10-CM   1. Oral infection 528.9 K12.2    due to suspected dental space infection s/p multiple tooth extractions - resolving  2. Essential hypertension 401.9 I10   3. Hypokalemia 276.8 E87.6   4. Controlled type 2 diabetes mellitus with complication, without long-term current use of insulin (HCC) 250.90 E11.8   5. Elevated bilirubin 277.4 R17   6. Fatty liver disease, nonalcoholic 571.8 K76.0   7. Depression with anxiety 300.4 F41.8   8. Anemia due to other cause 285.8 D64.89   9. Hyperlipidemia 272.4 E78.5    Complete 7 days of augmentin  Add floraster po BID x 3 weeks for colon protection  Cont other meds as ordered  F/u with oral surgeon Dr Lorin Picket as scheduled for suture removal  CMP pending  PT/OT/ST as ordered  GOAL: short term rehab and d/c home when medically appropriate. Communicated with pt and nursing.  Will follow  Tyrail Grandfield S. Ancil Linsey  [facility] [address] [place], [place] [address] [phone] Cell (Monday-Friday 8 AM - 5 PM) [phone] After 5 PM and follow prompts

## 2016-03-31 ENCOUNTER — Encounter: Payer: Self-pay | Admitting: Adult Health

## 2016-03-31 ENCOUNTER — Non-Acute Institutional Stay (SKILLED_NURSING_FACILITY): Payer: Medicare Other | Admitting: Adult Health

## 2016-03-31 DIAGNOSIS — A419 Sepsis, unspecified organism: Secondary | ICD-10-CM

## 2016-03-31 DIAGNOSIS — I10 Essential (primary) hypertension: Secondary | ICD-10-CM | POA: Diagnosis not present

## 2016-03-31 DIAGNOSIS — E118 Type 2 diabetes mellitus with unspecified complications: Secondary | ICD-10-CM | POA: Diagnosis not present

## 2016-03-31 NOTE — Progress Notes (Signed)
Patient ID: Latasha Wang, female   DOB: 06/22/1940, 75 y.o.   MRN: [id number]    Location:   [facility] Room Number: 128B Place of Service:  SNF (31)    CODE STATUS: Full Code  No Known Allergies  Chief Complaint  Patient presents with  . Discharge Note    Discharge from facility    HPI:  She is being discharged to home with home health for pt/ot/rn. She will need a 3:1 commode and tube bench. She will need her prescriptions written and will need to follow up with her pcp.    Past Medical History:  Diagnosis Date  . ANXIETY   . DEPRESSION   . Diabetes mellitus   . FATTY LIVER DISEASE   . Gastroparesis   . GERD   . HYPERLIPIDEMIA   . HYPERTENSION   . LATERAL EPICONDYLITIS, RIGHT     Past Surgical History:  Procedure Laterality Date  . CATARACT EXTRACTION, BILATERAL    . TOTAL ELBOW ARTHROPLASTY  02/19/2012   Procedure: TOTAL ELBOW ARTHROPLASTY;  Surgeon: Budd PalmerMichael H Handy, MD;  Location: [facility] OR;  Service: Orthopedics;  Laterality: Left;    Social History   Social History  . Marital status: Divorced    Spouse name: N/A  . Number of children: 6  . Years of education: N/A   Occupational History  .  Retired   Social History Main Topics  . Smoking status: Never Smoker  . Smokeless tobacco: Never Used  . Alcohol use No  . Drug use: No  . Sexual activity: Not on file   Other Topics Concern  . Not on file   Social History Narrative  . No narrative on file   Family History  Problem Relation Age of Onset  . Diabetes Neg Hx   . Hypertension Neg Hx     VITAL SIGNS BP 112/69   Pulse 85   Temp 97.9 F (36.6 C) (Oral)   Resp 18   Ht 4\' 10"  (1.473 m)   Wt 147 lb 8 oz (66.9 kg)   SpO2 98%   BMI 30.83 kg/m   Patient's Medications  New Prescriptions   No medications on file  Previous Medications   ACETAMINOPHEN (TYLENOL) 500 MG TABLET    Take 1-2 tablets (500-1,000 mg total) by mouth every 6 (six) hours as needed for pain or fever. For pain    ALBUTEROL (PROVENTIL HFA;VENTOLIN HFA) 108 (90 BASE) MCG/ACT INHALER    Inhale 2 puffs into the lungs every 6 (six) hours as needed for wheezing.   AMLODIPINE (NORVASC) 10 MG TABLET    TAKE 1 TABLET BY MOUTH EVERY DAY   BUPROPION (WELLBUTRIN XL) 150 MG 24 HR TABLET    Take 1 tablet (150 mg total) by mouth daily.   CHLORHEXIDINE (PERIDEX) 0.12 % SOLUTION    Use as directed 30 mLs in the mouth or throat 3 (three) times daily.   CLONAZEPAM (KLONOPIN) 0.5 MG TABLET    Take one tablet by mouth every 12 hours as needed for anxiety   CRESTOR 10 MG TABLET    TAKE 1 TABLET (10 MG TOTAL) BY MOUTH DAILY.   FLUOXETINE (PROZAC) 40 MG CAPSULE    TAKE ONE CAPSULE BY MOUTH DAILY   HYDROCHLOROTHIAZIDE (MICROZIDE) 12.5 MG CAPSULE    TAKE ONE CAPSULE BY MOUTH EVERY DAY   KETOCONAZOLE (NIZORAL) 2 % SHAMPOO    Apply 1 application topically every Friday.   MECLIZINE (ANTIVERT) 12.5 MG TABLET  Take 1-2 tablets (12.5-25 mg total) by mouth every 6 (six) hours as needed. For dizziness   METFORMIN (GLUCOPHAGE-XR) 500 MG 24 HR TABLET    TAKE 1 TABLET BY MOUTH TWICE A DAY   PIOGLITAZONE (ACTOS) 45 MG TABLET    TAKE 1 TABLET (45 MG TOTAL) BY MOUTH DAILY.   SACCHAROMYCES BOULARDII (FLORASTOR) 250 MG CAPSULE    Take 250 mg by mouth 2 (two) times daily.  Modified Medications   No medications on file  Discontinued Medications   AMOXICILLIN-CLAVULANATE (AUGMENTIN) 875-125 MG TABLET    Take 1 tablet by mouth every 12 (twelve) hours. For 7days     SIGNIFICANT DIAGNOSTIC EXAMS  03-08-16: chest x-ray: Enlargement of cardiac silhouette. Minimal RIGHT basilar atelectasis. Aortic atherosclerosis.  03-08-16: maxillofacial ct scan: Post extraction of multiple teeth more notable involving lower teeth and greater on the right. In the extraction sites, there is poorly defined soft tissue and gas which may represent expected changes although postoperative inflammation not excluded in proper clinical setting. No deep drainable  abscess.   03-09-16: ct of head: No acute intracranial abnormality. Minimal atrophy for age  01-08-16: 2-d echo: - Left ventricle: The cavity size was normal. Wall thickness was increased in a pattern of mild LVH. Systolic function was normal. The estimated ejection fraction was in the range of 60% to 65%. Wall motion was normal; there were no regional wall motion abnormalities. Features are consistent with a pseudonormal left ventricular filling pattern, with concomitant abnormal relaxation and increased filling pressure (grade 2 diastolic dysfunction). Doppler parameters are consistent with high ventricular filling pressure. - Aortic valve: Transvalvular velocity was within the normal range. There was no stenosis. There was no regurgitation. - Mitral valve: Transvalvular velocity was within the normal range. There was no evidence for stenosis. There was no regurgitation. - Left atrium: The atrium was mildly dilated. - Right ventricle: The cavity size was normal. Wall thickness was normal. Systolic function was normal. - Tricuspid valve: There was mild regurgitation. - Pulmonic valve: Peak gradient (S): 12 mm Hg. - Pulmonary arteries: Systolic pressure was within the normal   range. PA peak pressure: 17 mm Hg (S).   03-12-16: mri of brain: Motion degraded examination demonstrating atrophy without acute intracranial findings. No abnormal postcontrast enhancement or restricted diffusion to suggest intracranial infection/inflammatory process. No significant sinus disease or parameningeal focus of infection.   LABS REVIEWED:   03-08-16: wbc 9.1; hgb 11.2; hct 35.0; mcv 90.0; plt 196; glucose 115; bun 16; creat 1.16; k+ 3.7; na++ 131; total bili 2.1 albumin 3.8; hgb a1c 6.3; urine and blood culture: no growth 03-09-16; sed rate 35 03-10-16: uric acid 2.7 03-12-16: wbc 7.5; hgb 9.5; hct 29.8;mcv 89.0; plt 161; glucose 109; bun 6; creat 0.86; k+ 2.8; na++ 139; mag 1.9 03-14-16:glucose 109; bun <5; creat  0.73; k+ 3.4; na++ 141 03-16-16: wbc 7.2; hgb 10.9; hct 34.5; mcv 89.8 ;plt 245; glucose 119; bun <5; creat 0.63; k+ 3.2; na++ 140; total bili 1.6; albumin 3.4     Review of Systems  Constitutional: Negative for malaise/fatigue.  Respiratory: Negative for cough and shortness of breath.   Cardiovascular: Negative for chest pain, palpitations and leg swelling.  Gastrointestinal: Negative for abdominal pain, constipation and heartburn.  Musculoskeletal: Negative for back pain, joint pain and myalgias.  Skin: Negative.   Neurological: Negative for dizziness.  Psychiatric/Behavioral: The patient is not nervous/anxious.     Physical Exam  Constitutional: No distress.  Eyes: Conjunctivae are normal.  Neck: Neck  supple. No JVD present. No thyromegaly present.  Cardiovascular: Normal rate, regular rhythm and intact distal pulses.   Respiratory: Effort normal and breath sounds normal. No respiratory distress. She has no wheezes.  GI: Soft. Bowel sounds are normal. She exhibits no distension. There is no tenderness.  Musculoskeletal: She exhibits no edema.  Able to move all extremities   Lymphadenopathy:    She has no cervical adenopathy.  Neurological: She is alert.  Skin: Skin is warm and dry. She is not diaphoretic.  Psychiatric: She has a normal mood and affect.    ASSESSMENT/ PLAN:  Patient is being discharged with the following home health services:  Pt/ot/rn: to evaluate and treat as indicated for gait balance strength ald training and medication management   Patient is being discharged with the following durable medical equipment:  3:1 commode tub bench   Patient has been advised to f/u with their PCP in 1-2 weeks to bring them up to date on their rehab stay.  Social services at facility was responsible for arranging this appointment.  Pt was provided with a 30 day supply of prescriptions for medications and refills must be obtained from their PCP.  For controlled substances, a more  limited supply may be provided adequate until PCP appointment only. #30 klonopin 0.5 mg tabs.    Time spent with patient   40  minutes >50% time spent counseling; reviewing medical record; tests; labs; and developing future plan of care   Synthia Innocent NP [facility]  Contact [phone] Monday through Friday 8am- 5pm  After hours call [phone]

## 2016-04-08 ENCOUNTER — Other Ambulatory Visit: Payer: Self-pay | Admitting: Internal Medicine

## 2016-04-09 ENCOUNTER — Ambulatory Visit (INDEPENDENT_AMBULATORY_CARE_PROVIDER_SITE_OTHER): Payer: Medicare Other | Admitting: Internal Medicine

## 2016-04-09 VITALS — BP 136/78 | HR 84 | Temp 98.0°F | Resp 20 | Wt 145.0 lb

## 2016-04-09 DIAGNOSIS — R509 Fever, unspecified: Secondary | ICD-10-CM | POA: Diagnosis not present

## 2016-04-09 DIAGNOSIS — I10 Essential (primary) hypertension: Secondary | ICD-10-CM

## 2016-04-09 DIAGNOSIS — R410 Disorientation, unspecified: Secondary | ICD-10-CM

## 2016-04-09 DIAGNOSIS — F32A Depression, unspecified: Secondary | ICD-10-CM

## 2016-04-09 DIAGNOSIS — F329 Major depressive disorder, single episode, unspecified: Secondary | ICD-10-CM | POA: Diagnosis not present

## 2016-04-09 NOTE — Patient Instructions (Signed)
Please continue all other medications as before, and refills have been done if requested.  Please have the pharmacy call with any other refills you may need.  Please continue your efforts at being more active, low cholesterol diet, and weight control.  You are otherwise up to date with prevention measures today.  Please keep your appointments with your specialists as you may have planned   

## 2016-04-09 NOTE — Progress Notes (Signed)
Pre visit review using our clinic review tool, if applicable. No additional management support is needed unless otherwise documented below in the visit note. 

## 2016-04-09 NOTE — Progress Notes (Signed)
Subjective:    Patient ID: Latasha Wang, female    DOB: 09/17/1940, 75 y.o.   MRN: [id number]  HPI  Here with daughter, pt with recent 14 teeth removed per oral surgeon, unfortunately soon after developed sepsis, confused, AKI, and tx for presumed dental space infection. Requiring hospn, IVF, IV antibx , with d/c to rehab x 2 wks, sutures removed per oral surgury a few days post d/c,  Now at home with good family support, and PT.  Still needs significant assistance with bathing, dressing, cooking, toileting, but feeds herself, and has some level of persistent mild confusion..  Denies worsening depressive symptoms, suicidal ideation, or panic; is on ssri/wellbutrin/klonopin but with some increased anxiety level recently with her illness.  No further fever, mouth pain, HA, ST, cough, and Pt denies chest pain, increased sob or doe, wheezing, orthopnea, PND, increased LE swelling, palpitations, dizziness or syncope.  Pt denies new neurological symptoms such as new headache, or facial or extremity weakness or numbness Past Medical History:  Diagnosis Date  . ANXIETY   . DEPRESSION   . Diabetes mellitus   . Essential hypertension 05/09/2007   Qualifier: Diagnosis of  By: Nena JordanYoo DO, D. Robert   . FATTY LIVER DISEASE   . Gastroparesis   . GERD   . HYPERLIPIDEMIA   . HYPERTENSION   . LATERAL EPICONDYLITIS, RIGHT    Past Surgical History:  Procedure Laterality Date  . CATARACT EXTRACTION, BILATERAL    . TOTAL ELBOW ARTHROPLASTY  02/19/2012   Procedure: TOTAL ELBOW ARTHROPLASTY;  Surgeon: Budd PalmerMichael H Handy, MD;  Location: [facility] OR;  Service: Orthopedics;  Laterality: Left;    reports that she has never smoked. She has never used smokeless tobacco. She reports that she does not drink alcohol or use drugs. family history is not on file. No Known Allergies Current Outpatient Prescriptions on File Prior to Visit  Medication Sig Dispense Refill  . acetaminophen (TYLENOL) 500 MG tablet Take 1-2 tablets  (500-1,000 mg total) by mouth every 6 (six) hours as needed for pain or fever. For pain 90 tablet 0  . albuterol (PROVENTIL HFA;VENTOLIN HFA) 108 (90 BASE) MCG/ACT inhaler Inhale 2 puffs into the lungs every 6 (six) hours as needed for wheezing. 1 Inhaler 0  . amLODipine (NORVASC) 10 MG tablet TAKE 1 TABLET BY MOUTH EVERY DAY 90 tablet 3  . buPROPion (WELLBUTRIN XL) 150 MG 24 hr tablet Take 1 tablet (150 mg total) by mouth daily. 90 tablet 3  . chlorhexidine (PERIDEX) 0.12 % solution Use as directed 30 mLs in the mouth or throat 3 (three) times daily.    . clonazePAM (KLONOPIN) 0.5 MG tablet Take one tablet by mouth every 12 hours as needed for anxiety 60 tablet 0  . CRESTOR 10 MG tablet TAKE 1 TABLET (10 MG TOTAL) BY MOUTH DAILY. 90 tablet 1  . FLUoxetine (PROZAC) 40 MG capsule TAKE ONE CAPSULE BY MOUTH DAILY 90 capsule 3  . hydrochlorothiazide (MICROZIDE) 12.5 MG capsule TAKE ONE CAPSULE BY MOUTH EVERY DAY 90 capsule 2  . ketoconazole (NIZORAL) 2 % shampoo Apply 1 application topically every Friday.  4  . meclizine (ANTIVERT) 12.5 MG tablet Take 1-2 tablets (12.5-25 mg total) by mouth every 6 (six) hours as needed. For dizziness 30 tablet 0  . metFORMIN (GLUCOPHAGE-XR) 500 MG 24 hr tablet TAKE 1 TABLET BY MOUTH TWICE A DAY 180 tablet 1  . pioglitazone (ACTOS) 45 MG tablet TAKE 1 TABLET (45 MG TOTAL) BY MOUTH DAILY.  90 tablet 2   No current facility-administered medications on file prior to visit.    Review of Systems Pt essentially unable/does not want to respond, daughter denies other symtpoms    Objective:   Physical Exam BP 136/78   Pulse 84   Temp 98 F (36.7 C) (Oral)   Resp 20   Wt 145 lb (65.8 kg)   SpO2 96%   BMI 30.31 kg/m  VS noted, obese Constitutional: Pt appears in no apparent distress HENT: Head: NCAT.  Right Ear: External ear normal.  Left Ear: External ear normal.  Eyes: . Pupils are equal, round, and reactive to light. Conjunctivae and EOM are normal Neck:  Normal range of motion. Neck supple.  Cardiovascular: Normal rate and regular rhythm.   Pulmonary/Chest: Effort normal and breath sounds without rales or wheezing.  Abd:  Soft, NT, ND, + BS Neurological: Pt is alert. mild confused , motor grossly intact Skin: Skin is warm. No rash, no LE edema Psychiatric: Pt behavior is normal. No agitation. but markedly depressed affect    Assessment & Plan:

## 2016-04-11 NOTE — Assessment & Plan Note (Signed)
Suspect situationally worse, but declines any change of tx or referral psychiatry or counseling,  to f/u any worsening symptoms or concerns, cont current tx

## 2016-04-11 NOTE — Assessment & Plan Note (Signed)
Mild persistent, ? Pseudodementia, cont to monitor

## 2016-04-11 NOTE — Assessment & Plan Note (Signed)
stable overall by history and exam, recent data reviewed with pt, and pt to continue medical treatment as before,  to f/u any worsening symptoms or concerns BP Readings from Last 3 Encounters:  04/09/16 136/78  03/31/16 112/69  03/19/16 139/82

## 2016-04-11 NOTE — Assessment & Plan Note (Signed)
S/p tx for suspected dental space infection, now improved,  to f/u any worsening symptoms or concerns

## 2016-05-04 ENCOUNTER — Other Ambulatory Visit: Payer: Self-pay | Admitting: Adult Health

## 2016-05-11 DIAGNOSIS — K760 Fatty (change of) liver, not elsewhere classified: Secondary | ICD-10-CM | POA: Diagnosis not present

## 2016-05-11 DIAGNOSIS — E119 Type 2 diabetes mellitus without complications: Secondary | ICD-10-CM | POA: Diagnosis not present

## 2016-05-11 DIAGNOSIS — G9349 Other encephalopathy: Secondary | ICD-10-CM

## 2016-05-11 DIAGNOSIS — I10 Essential (primary) hypertension: Secondary | ICD-10-CM | POA: Diagnosis not present

## 2016-05-11 DIAGNOSIS — Z7984 Long term (current) use of oral hypoglycemic drugs: Secondary | ICD-10-CM

## 2016-05-11 DIAGNOSIS — F329 Major depressive disorder, single episode, unspecified: Secondary | ICD-10-CM

## 2016-05-18 ENCOUNTER — Other Ambulatory Visit: Payer: Self-pay | Admitting: Internal Medicine

## 2016-05-19 NOTE — Telephone Encounter (Signed)
Done hardcopy to Corinne  

## 2016-05-20 NOTE — Telephone Encounter (Signed)
faxed

## 2016-06-04 ENCOUNTER — Other Ambulatory Visit: Payer: Self-pay | Admitting: Internal Medicine

## 2016-06-06 ENCOUNTER — Other Ambulatory Visit: Payer: Self-pay | Admitting: Adult Health

## 2016-06-08 ENCOUNTER — Other Ambulatory Visit: Payer: Self-pay | Admitting: Internal Medicine

## 2016-06-09 ENCOUNTER — Other Ambulatory Visit (INDEPENDENT_AMBULATORY_CARE_PROVIDER_SITE_OTHER): Payer: Medicare Other

## 2016-06-09 ENCOUNTER — Encounter: Payer: Self-pay | Admitting: Internal Medicine

## 2016-06-09 ENCOUNTER — Ambulatory Visit (INDEPENDENT_AMBULATORY_CARE_PROVIDER_SITE_OTHER): Payer: Medicare Other | Admitting: Internal Medicine

## 2016-06-09 VITALS — BP 134/78 | HR 67 | Temp 98.0°F | Resp 20 | Wt 154.0 lb

## 2016-06-09 DIAGNOSIS — E118 Type 2 diabetes mellitus with unspecified complications: Secondary | ICD-10-CM | POA: Diagnosis not present

## 2016-06-09 DIAGNOSIS — Z0001 Encounter for general adult medical examination with abnormal findings: Secondary | ICD-10-CM

## 2016-06-09 DIAGNOSIS — D649 Anemia, unspecified: Secondary | ICD-10-CM | POA: Diagnosis not present

## 2016-06-09 DIAGNOSIS — R011 Cardiac murmur, unspecified: Secondary | ICD-10-CM

## 2016-06-09 DIAGNOSIS — G4700 Insomnia, unspecified: Secondary | ICD-10-CM

## 2016-06-09 DIAGNOSIS — I10 Essential (primary) hypertension: Secondary | ICD-10-CM

## 2016-06-09 LAB — BASIC METABOLIC PANEL
BUN: 19 mg/dL (ref 6–23)
CHLORIDE: 103 meq/L (ref 96–112)
CO2: 29 meq/L (ref 19–32)
CREATININE: 0.93 mg/dL (ref 0.40–1.20)
Calcium: 9.2 mg/dL (ref 8.4–10.5)
GFR: 62.55 mL/min (ref >60.00)
Glucose, Bld: 103 mg/dL — ABNORMAL HIGH (ref 70–99)
Potassium: 4.1 mEq/L (ref 3.5–5.1)
Sodium: 139 mEq/L (ref 135–145)

## 2016-06-09 LAB — CBC WITH DIFFERENTIAL/PLATELET
BASOS ABS: 0 10*3/uL (ref 0.0–0.1)
Basophils Relative: 0.7 % (ref 0.0–3.0)
EOS ABS: 0.1 10*3/uL (ref 0.0–0.7)
Eosinophils Relative: 1.7 % (ref 0.0–5.0)
HEMATOCRIT: 33.5 % — AB (ref 36.0–46.0)
HEMOGLOBIN: 11.2 g/dL — AB (ref 12.0–15.0)
Lymphocytes Relative: 42.3 % (ref 12.0–46.0)
Lymphs Abs: 2.6 10*3/uL (ref 0.7–4.0)
MCHC: 33.4 g/dL (ref 30.0–36.0)
MCV: 88 fl (ref 78.0–100.0)
MONO ABS: 0.5 10*3/uL (ref 0.1–1.0)
Monocytes Relative: 8.1 % (ref 3.0–12.0)
NEUTROS ABS: 2.9 10*3/uL (ref 1.4–7.7)
Neutrophils Relative %: 47.2 % (ref 43.0–77.0)
PLATELETS: 243 10*3/uL (ref 150.0–400.0)
RBC: 3.8 Mil/uL — ABNORMAL LOW (ref 3.87–5.11)
RDW: 15.9 % — ABNORMAL HIGH (ref 11.5–15.5)
WBC: 6.2 10*3/uL (ref 4.0–10.5)

## 2016-06-09 LAB — HEPATIC FUNCTION PANEL
ALT: 21 U/L (ref 0–35)
AST: 22 U/L (ref 0–37)
Albumin: 4.4 g/dL (ref 3.5–5.2)
Alkaline Phosphatase: 60 U/L (ref 39–117)
Bilirubin, Direct: 0.2 mg/dL (ref 0.0–0.3)
TOTAL PROTEIN: 7.1 g/dL (ref 6.0–8.3)
Total Bilirubin: 1.2 mg/dL (ref 0.2–1.2)

## 2016-06-09 LAB — LIPID PANEL
CHOLESTEROL: 163 mg/dL (ref 0–200)
HDL: 71.5 mg/dL (ref >39.00)
LDL Cholesterol: 69 mg/dL (ref 0–99)
NonHDL: 91.17
TRIGLYCERIDES: 109 mg/dL (ref 0.0–149.0)
Total CHOL/HDL Ratio: 2
VLDL: 21.8 mg/dL (ref 0.0–40.0)

## 2016-06-09 LAB — HEMOGLOBIN A1C: HEMOGLOBIN A1C: 6.5 % (ref 4.6–6.5)

## 2016-06-09 LAB — IBC PANEL
IRON: 86 ug/dL (ref 42–145)
SATURATION RATIOS: 20.8 % (ref 20.0–50.0)
TRANSFERRIN: 296 mg/dL (ref 212.0–360.0)

## 2016-06-09 LAB — VITAMIN B12: VITAMIN B 12: 329 pg/mL (ref 211–911)

## 2016-06-09 NOTE — Progress Notes (Signed)
Pre visit review using our clinic review tool, if applicable. No additional management support is needed unless otherwise documented below in the visit note. 

## 2016-06-09 NOTE — Progress Notes (Signed)
Subjective:    Patient ID: Latasha Wang, female    DOB: 05/24/1941, 75 y.o.   MRN: [id number]  HPI  Here to f/u; overall doing ok,  Pt denies chest pain, increasing sob or doe, wheezing, orthopnea, PND, increased LE swelling, palpitations, dizziness or syncope.  Pt denies new neurological symptoms such as new headache, or facial or extremity weakness or numbness.  Pt denies polydipsia, polyuria, or low sugar episode.   Pt denies new neurological symptoms such as new headache, or facial or extremity weakness or numbness.   Pt states overall good compliance with meds, mostly trying to follow appropriate diet, with wt overall stable,  but little exercise however.  Gained some wt after eating better after hospn late sept. Wt Readings from Last 3 Encounters:  06/09/16 154 lb (69.9 kg)  04/09/16 145 lb (65.8 kg)  03/31/16 147 lb 8 oz (66.9 kg)  No overt recent bleeding.  Has had worsening insomnia recently assoc with some increased stressors, unable to get to sleep most nights.   Past Medical History:  Diagnosis Date  . ANXIETY   . DEPRESSION   . Diabetes mellitus   . Essential hypertension 05/09/2007   Qualifier: Diagnosis of  By: Nena JordanYoo DO, D. Robert   . FATTY LIVER DISEASE   . Gastroparesis   . GERD   . HYPERLIPIDEMIA   . HYPERTENSION   . LATERAL EPICONDYLITIS, RIGHT    Past Surgical History:  Procedure Laterality Date  . CATARACT EXTRACTION, BILATERAL    . TOTAL ELBOW ARTHROPLASTY  02/19/2012   Procedure: TOTAL ELBOW ARTHROPLASTY;  Surgeon: Budd PalmerMichael H Handy, MD;  Location: [facility] OR;  Service: Orthopedics;  Laterality: Left;    reports that she has never smoked. She has never used smokeless tobacco. She reports that she does not drink alcohol or use drugs. family history is not on file. No Known Allergies Current Outpatient Prescriptions on File Prior to Visit  Medication Sig Dispense Refill  . acetaminophen (TYLENOL) 500 MG tablet Take 1-2 tablets (500-1,000 mg total) by mouth every 6  (six) hours as needed for pain or fever. For pain 90 tablet 0  . albuterol (PROVENTIL HFA;VENTOLIN HFA) 108 (90 BASE) MCG/ACT inhaler Inhale 2 puffs into the lungs every 6 (six) hours as needed for wheezing. 1 Inhaler 0  . amLODipine (NORVASC) 10 MG tablet TAKE 1 TABLET BY MOUTH EVERY DAY 90 tablet 3  . buPROPion (WELLBUTRIN XL) 150 MG 24 hr tablet Take 1 tablet (150 mg total) by mouth daily. 90 tablet 3  . chlorhexidine (PERIDEX) 0.12 % solution Use as directed 30 mLs in the mouth or throat 3 (three) times daily.    . clonazePAM (KLONOPIN) 0.5 MG tablet TAKE 1 TABLET BY MOUTH TWICE A DAY 30 tablet 2  . FLUoxetine (PROZAC) 40 MG capsule TAKE ONE CAPSULE BY MOUTH DAILY 90 capsule 3  . hydrochlorothiazide (MICROZIDE) 12.5 MG capsule TAKE ONE CAPSULE BY MOUTH EVERY DAY 90 capsule 2  . ketoconazole (NIZORAL) 2 % shampoo Apply 1 application topically every Friday.  4  . meclizine (ANTIVERT) 12.5 MG tablet TAKE 1 TO 2 TABLETS BY MOUTH EVERY 6 HOURS AS NEEDED 30 tablet 2  . metFORMIN (GLUCOPHAGE-XR) 500 MG 24 hr tablet TAKE 1 TABLET BY MOUTH TWICE A DAY 180 tablet 1  . pioglitazone (ACTOS) 45 MG tablet TAKE 1 TABLET (45 MG TOTAL) BY MOUTH DAILY. 90 tablet 2  . rosuvastatin (CRESTOR) 10 MG tablet TAKE 1 TABLET BY MOUTH EVERY EVENING 30  tablet 5   No current facility-administered medications on file prior to visit.    Review of Systems  Constitutional: Negative for unusual diaphoresis or night sweats HENT: Negative for ear swelling or discharge Eyes: Negative for worsening visual haziness  Respiratory: Negative for choking and stridor.   Gastrointestinal: Negative for distension or worsening eructation Genitourinary: Negative for retention or change in urine volume.  Musculoskeletal: Negative for other MSK pain or swelling Skin: Negative for color change and worsening wound Neurological: Negative for tremors and numbness other than noted  Psychiatric/Behavioral: Negative for decreased concentration  or agitation other than above   All other system neg per pt    Objective:   Physical Exam BP 134/78   Pulse 67   Temp 98 F (36.7 C) (Oral)   Resp 20   Wt 154 lb (69.9 kg)   SpO2 98%   BMI 32.19 kg/m  VS noted,  Constitutional: Pt appears in no apparent distress HENT: Head: NCAT.  Right Ear: External ear normal.  Left Ear: External ear normal.  Eyes: . Pupils are equal, round, and reactive to light. Conjunctivae and EOM are normal Neck: Normal range of motion. Neck supple.  Cardiovascular: Normal rate and regular rhythm.  Gr 1-2/6 RUSB sys murmur Pulmonary/Chest: Effort normal and breath sounds without rales or wheezing.  Abd:  Soft, NT, ND, + BS Neurological: Pt is alert. Not confused , motor grossly intact Skin: Skin is warm. No rash, no LE edema Psychiatric: Pt behavior is normal. No agitation.   Echocardiography 03/10/2016 Study Conclusions  - Left ventricle: The cavity size was normal. Wall thickness was   increased in a pattern of mild LVH. Systolic function was normal.   The estimated ejection fraction was in the range of 60% to 65%.   Wall motion was normal; there were no regional wall motion   abnormalities. Features are consistent with a pseudonormal left   ventricular filling pattern, with concomitant abnormal relaxation   and increased filling pressure (grade 2 diastolic dysfunction).   Doppler parameters are consistent with high ventricular filling   pressure. - Aortic valve: Transvalvular velocity was within the normal range.   There was no stenosis. There was no regurgitation. - Mitral valve: Transvalvular velocity was within the normal range.   There was no evidence for stenosis. There was no regurgitation. - Left atrium: The atrium was mildly dilated. - Right ventricle: The cavity size was normal. Wall thickness was   normal. Systolic function was normal. - Tricuspid valve: There was mild regurgitation. - Pulmonic valve: Peak gradient (S): 12 mm  Hg. - Pulmonary arteries: Systolic pressure was within the normal   range. PA peak pressure: 17 mm Hg (S).    Assessment & Plan:

## 2016-06-09 NOTE — Patient Instructions (Addendum)
You had the flu shot today  Please take all new medication as prescribed - the trazodone  Please continue all other medications as before, and refills have been done if requested.  Please have the pharmacy call with any other refills you may need.  Please continue your efforts at being more active, low cholesterol diet, and weight control.  Please keep your appointments with your specialists as you may have planned  Please go to the LAB in the Basement (turn left off the elevator) for the tests to be done today  You will be contacted by phone if any changes need to be made immediately.  Otherwise, you will receive a letter about your results with an explanation, but please check with MyChart first.  Please remember to sign up for MyChart if you have not done so, as this will be important to you in the future with finding out test results, communicating by private email, and scheduling acute appointments online when needed.  Please return in 6 months, or sooner if needed, with Lab testing done 3-5 days before

## 2016-06-14 DIAGNOSIS — G4700 Insomnia, unspecified: Secondary | ICD-10-CM | POA: Insufficient documentation

## 2016-06-14 DIAGNOSIS — R011 Cardiac murmur, unspecified: Secondary | ICD-10-CM | POA: Insufficient documentation

## 2016-06-14 NOTE — Assessment & Plan Note (Signed)
Etiology unclear, for cbc, iron and b12,  to f/u any worsening symptoms or concerns

## 2016-06-14 NOTE — Assessment & Plan Note (Signed)
stable overall by history and exam, recent data reviewed with pt, and pt to continue medical treatment as before,  to f/u any worsening symptoms or concerns BP Readings from Last 3 Encounters:  06/09/16 134/78  04/09/16 136/78  03/31/16 112/69

## 2016-06-14 NOTE — Assessment & Plan Note (Signed)
Etiology unclear. For b12 and iron level

## 2016-06-14 NOTE — Assessment & Plan Note (Signed)
stable overall by history and exam, recent data reviewed with pt, and pt to continue medical treatment as before,  to f/u any worsening symptoms or concerns Lab Results  Component Value Date   HGBA1C 6.5 06/09/2016

## 2016-06-14 NOTE — Assessment & Plan Note (Signed)
Mild to mod, ok for trazodone qh prn,  to f/u any worsening symptoms or concerns

## 2016-06-14 NOTE — Assessment & Plan Note (Signed)
Recent echo without significant valve abnormal, asympt, ok to cont to follow

## 2016-06-24 ENCOUNTER — Other Ambulatory Visit: Payer: Self-pay | Admitting: Internal Medicine

## 2016-06-26 ENCOUNTER — Other Ambulatory Visit: Payer: Self-pay | Admitting: Internal Medicine

## 2016-07-11 ENCOUNTER — Other Ambulatory Visit: Payer: Self-pay | Admitting: Internal Medicine

## 2016-07-15 ENCOUNTER — Other Ambulatory Visit: Payer: Self-pay | Admitting: Internal Medicine

## 2016-07-15 NOTE — Telephone Encounter (Signed)
faxed

## 2016-07-15 NOTE — Telephone Encounter (Signed)
Done hardcopy to Corinne  

## 2016-07-31 ENCOUNTER — Other Ambulatory Visit: Payer: Self-pay | Admitting: Internal Medicine

## 2016-09-01 ENCOUNTER — Other Ambulatory Visit: Payer: Self-pay | Admitting: Internal Medicine

## 2016-09-22 ENCOUNTER — Telehealth: Payer: Self-pay | Admitting: Internal Medicine

## 2016-09-22 NOTE — Telephone Encounter (Signed)
Too soon for klonopin as should have med enough to near end of April 2018

## 2016-09-22 NOTE — Telephone Encounter (Signed)
Pt called in and need refill on this med.  She only has one dose left .  Pharmacy told her it was to early to refill

## 2016-09-22 NOTE — Telephone Encounter (Signed)
I had forwarded this med request to him earlier today. He denied it due it it being to early. He stated that she is not eligible for another refill until the end of the month.

## 2016-09-23 ENCOUNTER — Other Ambulatory Visit: Payer: Self-pay | Admitting: Internal Medicine

## 2016-09-23 ENCOUNTER — Telehealth: Payer: Self-pay | Admitting: Internal Medicine

## 2016-09-23 NOTE — Telephone Encounter (Signed)
Faxed

## 2016-09-23 NOTE — Telephone Encounter (Signed)
Done hardcopy to Shirron  

## 2016-09-23 NOTE — Telephone Encounter (Signed)
Pt has had 2 years of Very well controlled A1c, and although it would be optimal to take daily cbg's, in this case the patient has deferred this for now.

## 2016-09-23 NOTE — Telephone Encounter (Signed)
Latasha Wang from Dr Adela Lank Mckenzie's office called to let you know that while the pt was in the office with him today, she told Dr Ronne Binning that she is not checking her glucose at home. They wanted to let Dr Jonny Ruiz know.

## 2016-10-10 ENCOUNTER — Other Ambulatory Visit: Payer: Self-pay | Admitting: Internal Medicine

## 2016-10-14 ENCOUNTER — Other Ambulatory Visit: Payer: Self-pay

## 2016-10-14 ENCOUNTER — Other Ambulatory Visit: Payer: Self-pay | Admitting: Internal Medicine

## 2016-10-14 MED ORDER — BUPROPION HCL ER (XL) 150 MG PO TB24: 150.0000 mg | ORAL_TABLET | Freq: Every day | ORAL | 2 refills | Status: DC

## 2016-10-14 NOTE — Telephone Encounter (Signed)
Done erx 

## 2016-10-14 NOTE — Telephone Encounter (Signed)
[facility] is requesting a [facility]d supply. Please advise.

## 2016-11-07 ENCOUNTER — Other Ambulatory Visit: Payer: Self-pay | Admitting: Internal Medicine

## 2016-11-20 ENCOUNTER — Other Ambulatory Visit: Payer: Self-pay | Admitting: Internal Medicine

## 2016-11-30 ENCOUNTER — Other Ambulatory Visit (INDEPENDENT_AMBULATORY_CARE_PROVIDER_SITE_OTHER): Payer: Medicare Other

## 2016-11-30 DIAGNOSIS — E118 Type 2 diabetes mellitus with unspecified complications: Secondary | ICD-10-CM | POA: Diagnosis not present

## 2016-11-30 DIAGNOSIS — Z0001 Encounter for general adult medical examination with abnormal findings: Secondary | ICD-10-CM

## 2016-11-30 LAB — CBC WITH DIFFERENTIAL/PLATELET
BASOS ABS: 0.1 10*3/uL (ref 0.0–0.1)
Basophils Relative: 1.2 % (ref 0.0–3.0)
EOS ABS: 0.1 10*3/uL (ref 0.0–0.7)
Eosinophils Relative: 2.9 % (ref 0.0–5.0)
HCT: 36.7 % (ref 36.0–46.0)
Hemoglobin: 12 g/dL (ref 12.0–15.0)
Lymphocytes Relative: 41.6 % (ref 12.0–46.0)
Lymphs Abs: 1.8 10*3/uL (ref 0.7–4.0)
MCHC: 32.7 g/dL (ref 30.0–36.0)
MCV: 89.2 fl (ref 78.0–100.0)
MONO ABS: 0.3 10*3/uL (ref 0.1–1.0)
Monocytes Relative: 8.1 % (ref 3.0–12.0)
NEUTROS ABS: 2 10*3/uL (ref 1.4–7.7)
Neutrophils Relative %: 46.2 % (ref 43.0–77.0)
PLATELETS: 200 10*3/uL (ref 150.0–400.0)
RBC: 4.11 Mil/uL (ref 3.87–5.11)
RDW: 14.8 % (ref 11.5–15.5)
WBC: 4.3 10*3/uL (ref 4.0–10.5)

## 2016-11-30 LAB — URINALYSIS, ROUTINE W REFLEX MICROSCOPIC
Bilirubin Urine: NEGATIVE
Hgb urine dipstick: NEGATIVE
Ketones, ur: NEGATIVE
Leukocytes, UA: NEGATIVE
Nitrite: NEGATIVE
SPECIFIC GRAVITY, URINE: 1.02 (ref 1.000–1.030)
TOTAL PROTEIN, URINE-UPE24: NEGATIVE
URINE GLUCOSE: NEGATIVE
UROBILINOGEN UA: 0.2 (ref 0.0–1.0)
pH: 5.5 (ref 5.0–8.0)

## 2016-11-30 LAB — MICROALBUMIN / CREATININE URINE RATIO
Creatinine,U: 99 mg/dL
Microalb Creat Ratio: 0.8 mg/g (ref 0.0–30.0)
Microalb, Ur: 0.8 mg/dL (ref 0.0–1.9)

## 2016-11-30 LAB — BASIC METABOLIC PANEL
BUN: 22 mg/dL (ref 6–23)
CHLORIDE: 105 meq/L (ref 96–112)
CO2: 26 meq/L (ref 19–32)
CREATININE: 1.06 mg/dL (ref 0.40–1.20)
Calcium: 9.5 mg/dL (ref 8.4–10.5)
GFR: 53.71 mL/min — ABNORMAL LOW (ref >60.00)
Glucose, Bld: 109 mg/dL — ABNORMAL HIGH (ref 70–99)
POTASSIUM: 4 meq/L (ref 3.5–5.1)
SODIUM: 140 meq/L (ref 135–145)

## 2016-11-30 LAB — HEPATIC FUNCTION PANEL
ALK PHOS: 61 U/L (ref 39–117)
ALT: 13 U/L (ref 0–35)
AST: 19 U/L (ref 0–37)
Albumin: 4.3 g/dL (ref 3.5–5.2)
BILIRUBIN DIRECT: 0.2 mg/dL (ref 0.0–0.3)
TOTAL PROTEIN: 7.2 g/dL (ref 6.0–8.3)
Total Bilirubin: 1.5 mg/dL — ABNORMAL HIGH (ref 0.2–1.2)

## 2016-11-30 LAB — LIPID PANEL
Cholesterol: 132 mg/dL (ref 0–200)
HDL: 57 mg/dL (ref >39.00)
LDL Cholesterol: 52 mg/dL (ref 0–99)
NONHDL: 74.56
Total CHOL/HDL Ratio: 2
Triglycerides: 115 mg/dL (ref 0.0–149.0)
VLDL: 23 mg/dL (ref 0.0–40.0)

## 2016-11-30 LAB — TSH: TSH: 1.19 u[IU]/mL (ref 0.35–4.50)

## 2016-11-30 LAB — HEMOGLOBIN A1C: Hgb A1c MFr Bld: 6.4 % (ref 4.6–6.5)

## 2016-12-02 ENCOUNTER — Other Ambulatory Visit: Payer: Self-pay | Admitting: Internal Medicine

## 2016-12-03 NOTE — Telephone Encounter (Signed)
Done hardcopy to Shirron  

## 2016-12-04 NOTE — Telephone Encounter (Signed)
faxed

## 2016-12-08 ENCOUNTER — Ambulatory Visit: Payer: Medicare Other | Admitting: Internal Medicine

## 2016-12-09 ENCOUNTER — Other Ambulatory Visit: Payer: Self-pay | Admitting: Internal Medicine

## 2016-12-15 ENCOUNTER — Encounter: Payer: Self-pay | Admitting: Internal Medicine

## 2016-12-15 ENCOUNTER — Ambulatory Visit (INDEPENDENT_AMBULATORY_CARE_PROVIDER_SITE_OTHER): Payer: Medicare Other | Admitting: Internal Medicine

## 2016-12-15 VITALS — BP 128/78 | HR 64 | Ht <= 58 in | Wt 167.0 lb

## 2016-12-15 DIAGNOSIS — Z0000 Encounter for general adult medical examination without abnormal findings: Secondary | ICD-10-CM | POA: Diagnosis not present

## 2016-12-15 DIAGNOSIS — E2839 Other primary ovarian failure: Secondary | ICD-10-CM

## 2016-12-15 DIAGNOSIS — E118 Type 2 diabetes mellitus with unspecified complications: Secondary | ICD-10-CM | POA: Diagnosis not present

## 2016-12-15 NOTE — Assessment & Plan Note (Signed)

## 2016-12-15 NOTE — Patient Instructions (Addendum)
Please continue all other medications as before, and refills have been done if requested.  Please have the pharmacy call with any other refills you may need.  Please continue your efforts at being more active, low cholesterol diet, and weight control.  You are otherwise up to date with prevention measures today.  Please keep your appointments with your specialists as you may have planned  You will be contacted regarding the referral for: colonoscopy  Please schedule the bone density test before leaving today at the scheduling desk (where you check out)  Please return in 6 months, or sooner if needed, with Lab testing done 3-5 days before

## 2016-12-15 NOTE — Progress Notes (Signed)
Subjective:    Patient ID: Latasha Wang, female    DOB: 02/12/1941, 76 y.o.   MRN: [id number]  HPI  Here for wellness and f/u;  Overall doing ok;  Pt denies Chest pain, worsening SOB, DOE, wheezing, orthopnea, PND, worsening LE edema, palpitations, dizziness or syncope.  Pt denies neurological change such as new headache, facial or extremity weakness.  Pt denies polydipsia, polyuria, or low sugar symptoms. Pt states overall good compliance with treatment and medications, good tolerability, and has been trying to follow appropriate diet.  Pt denies worsening depressive symptoms, suicidal ideation or panic. No fever, night sweats, wt loss, loss of appetite, or other constitutional symptoms.  Pt states good ability with ADL's, has low fall risk, home safety reviewed and adequate, no other significant changes in hearing or vision, and only occasionally active with exercise. Has had wt gain without diet or activity change per pt.No new complaints Wt Readings from Last 3 Encounters:  12/15/16 167 lb (75.8 kg)  06/09/16 154 lb (69.9 kg)  04/09/16 145 lb (65.8 kg)   BP Readings from Last 3 Encounters:  12/15/16 128/78  06/09/16 134/78  04/09/16 136/78   Past Medical History:  Diagnosis Date  . ANXIETY   . DEPRESSION   . Diabetes mellitus   . Essential hypertension 05/09/2007   Qualifier: Diagnosis of  By: Nena JordanYoo DO, D. Robert   . FATTY LIVER DISEASE   . Gastroparesis   . GERD   . HYPERLIPIDEMIA   . HYPERTENSION   . LATERAL EPICONDYLITIS, RIGHT    Past Surgical History:  Procedure Laterality Date  . CATARACT EXTRACTION, BILATERAL    . TOTAL ELBOW ARTHROPLASTY  02/19/2012   Procedure: TOTAL ELBOW ARTHROPLASTY;  Surgeon: Budd PalmerMichael H Handy, MD;  Location: [facility] OR;  Service: Orthopedics;  Laterality: Left;    reports that she has never smoked. She has never used smokeless tobacco. She reports that she does not drink alcohol or use drugs. family history is not on file. No Known Allergies Current  Outpatient Prescriptions on File Prior to Visit  Medication Sig Dispense Refill  . acetaminophen (TYLENOL) 500 MG tablet Take 1-2 tablets (500-1,000 mg total) by mouth every 6 (six) hours as needed for pain or fever. For pain 90 tablet 0  . albuterol (PROVENTIL HFA;VENTOLIN HFA) 108 (90 BASE) MCG/ACT inhaler Inhale 2 puffs into the lungs every 6 (six) hours as needed for wheezing. 1 Inhaler 0  . amLODipine (NORVASC) 10 MG tablet TAKE 1 TABLET BY MOUTH EVERY DAY 90 tablet 1  . buPROPion (WELLBUTRIN XL) 150 MG 24 hr tablet Take 1 tablet (150 mg total) by mouth daily. 90 tablet 2  . chlorhexidine (PERIDEX) 0.12 % solution Use as directed 30 mLs in the mouth or throat 3 (three) times daily.    . clonazePAM (KLONOPIN) 0.5 MG tablet TAKE 1 TABLET BY MOUTH TWICE A DAY 30 tablet 2  . FLUoxetine (PROZAC) 40 MG capsule TAKE ONE CAPSULE BY MOUTH DAILY 90 capsule 3  . hydrochlorothiazide (MICROZIDE) 12.5 MG capsule TAKE ONE CAPSULE BY MOUTH EVERY DAY 90 capsule 1  . irbesartan (AVAPRO) 300 MG tablet TAKE 1 TABLET (300 MG TOTAL) BY MOUTH AT BEDTIME. 90 tablet 1  . ketoconazole (NIZORAL) 2 % shampoo Apply 1 application topically every Friday.  4  . meclizine (ANTIVERT) 12.5 MG tablet TAKE 1 TO 2 TABLETS BY MOUTH EVERY 6 HOURS AS NEEDED 30 tablet 2  . metFORMIN (GLUCOPHAGE-XR) 500 MG 24 hr tablet TAKE 1 TABLET  BY MOUTH TWICE A DAY 180 tablet 1  . OLANZapine (ZYPREXA) 5 MG tablet TAKE 1 TABLET EVERY DAY 30 tablet 2  . pioglitazone (ACTOS) 45 MG tablet TAKE 1 TABLET (45 MG TOTAL) BY MOUTH DAILY. 90 tablet 2  . rosuvastatin (CRESTOR) 10 MG tablet TAKE 1 TABLET (10 MG TOTAL) BY MOUTH EVERY EVENING. ANNUAL APPT DUE IN JUNE MUST SEE MD FOR REFILLS 30 tablet 0   No current facility-administered medications on file prior to visit.    Review of Systems Constitutional: Negative for other unusual diaphoresis, sweats, appetite or weight changes HENT: Negative for other worsening hearing loss, ear pain, facial swelling,  mouth sores or neck stiffness.   Eyes: Negative for other worsening pain, redness or other visual disturbance.  Respiratory: Negative for other stridor or swelling Cardiovascular: Negative for other palpitations or other chest pain  Gastrointestinal: Negative for worsening diarrhea or loose stools, blood in stool, distention or other pain Genitourinary: Negative for hematuria, flank pain or other change in urine volume.  Musculoskeletal: Negative for myalgias or other joint swelling.  Skin: Negative for other color change, or other wound or worsening drainage.  Neurological: Negative for other syncope or numbness. Hematological: Negative for other adenopathy or swelling Psychiatric/Behavioral: Negative for hallucinations, other worsening agitation, SI, self-injury, or new decreased concentration All other system neg per pt    Objective:   Physical Exam BP 128/78   Pulse 64   Ht 4\' 10"  (1.473 m)   Wt 167 lb (75.8 kg)   SpO2 97%   BMI 34.90 kg/m  VS noted, obese Constitutional: Pt is oriented to person, place, and time. Appears well-developed and well-nourished, in no significant distress and comfortable Head: Normocephalic and atraumatic  Eyes: Conjunctivae and EOM are normal. Pupils are equal, round, and reactive to light Right Ear: External ear normal without discharge Left Ear: External ear normal without discharge Nose: Nose without discharge or deformity Mouth/Throat: Oropharynx is without other ulcerations and moist  Neck: Normal range of motion. Neck supple. No JVD present. No tracheal deviation present or significant neck LA or mass Cardiovascular: Normal rate, regular rhythm, normal heart sounds and intact distal pulses.   Pulmonary/Chest: WOB normal and breath sounds without rales or wheezing  Abdominal: Soft. Bowel sounds are normal. NT. No HSM  Musculoskeletal: Normal range of motion. Exhibits no edema Lymphadenopathy: Has no other cervical adenopathy.  Neurological: Pt  is alert and oriented to person, place, and time. Pt has normal reflexes. No cranial nerve deficit. Motor grossly intact, Gait intact Skin: Skin is warm and dry. No rash noted or new ulcerations Psychiatric:  Has normal mood and affect. Behavior is normal without agitation No other exam findings  Lab Results  Component Value Date   WBC 4.3 11/30/2016   HGB 12.0 11/30/2016   HCT 36.7 11/30/2016   PLT 200.0 11/30/2016   GLUCOSE 109 (H) 11/30/2016   CHOL 132 11/30/2016   TRIG 115.0 11/30/2016   HDL 57.00 11/30/2016   LDLDIRECT 85.1 08/19/2006   LDLCALC 52 11/30/2016   ALT 13 11/30/2016   AST 19 11/30/2016   NA 140 11/30/2016   K 4.0 11/30/2016   CL 105 11/30/2016   CREATININE 1.06 11/30/2016   BUN 22 11/30/2016   CO2 26 11/30/2016   TSH 1.19 11/30/2016   INR 0.91 02/19/2012   HGBA1C 6.4 11/30/2016   MICROALBUR 0.8 11/30/2016        Assessment & Plan:

## 2016-12-15 NOTE — Assessment & Plan Note (Signed)
stable overall by history and exam, recent data reviewed with pt, and pt to continue medical treatment as before,  to f/u any worsening symptoms or concerns Lab Results  Component Value Date   HGBA1C 6.4 11/30/2016

## 2016-12-22 ENCOUNTER — Ambulatory Visit (INDEPENDENT_AMBULATORY_CARE_PROVIDER_SITE_OTHER)
Admission: RE | Admit: 2016-12-22 | Discharge: 2016-12-22 | Disposition: A | Discharge status: Home / Self Care | Payer: Medicare Other | Source: Ambulatory Visit | Attending: Internal Medicine | Admitting: Internal Medicine

## 2016-12-22 DIAGNOSIS — E2839 Other primary ovarian failure: Secondary | ICD-10-CM

## 2016-12-25 ENCOUNTER — Telehealth: Payer: Self-pay | Admitting: Internal Medicine

## 2016-12-25 MED ORDER — CLONAZEPAM 0.5 MG PO TABS: 0.5000 mg | ORAL_TABLET | Freq: Two times a day (BID) | ORAL | 2 refills | Status: DC

## 2016-12-25 NOTE — Telephone Encounter (Signed)
Done hardcopy to Shirron  

## 2016-12-25 NOTE — Telephone Encounter (Signed)
[facility] to [facility] [address]  clonazePAM (KLONOPIN) 0.5 MG tablet

## 2016-12-25 NOTE — Telephone Encounter (Signed)
faxed

## 2016-12-27 DIAGNOSIS — E2839 Other primary ovarian failure: Secondary | ICD-10-CM | POA: Diagnosis not present

## 2016-12-28 ENCOUNTER — Other Ambulatory Visit: Payer: Self-pay | Admitting: Internal Medicine

## 2016-12-28 ENCOUNTER — Encounter: Payer: Self-pay | Admitting: Internal Medicine

## 2016-12-28 MED ORDER — ALENDRONATE SODIUM 70 MG PO TABS: 70.0000 mg | ORAL_TABLET | ORAL | 3 refills | Status: DC

## 2016-12-28 NOTE — Progress Notes (Signed)
Letter regarding cardiac fxn is error

## 2016-12-29 ENCOUNTER — Telehealth: Payer: Self-pay

## 2016-12-29 NOTE — Telephone Encounter (Signed)
-----   Message from Corwin LevinsJames W John, MD sent at 12/28/2016  4:51 PM EDT ----- Left message on MyChart, pt to cont same tx except  The test results show that your current treatment is OK, except the bone density is consistent with osteoporosis (weak bones).  We would like you to start Fosamax 70 mg weekly to help reduce the chance of worsening and bone fractures.  A new prescription will be sent, and you should hear from the office as well.  Shirron to please inform pt, I will do rx

## 2016-12-29 NOTE — Telephone Encounter (Signed)
Called pt, LVM.   

## 2016-12-30 NOTE — Telephone Encounter (Signed)
Pt called back in and was given her results and expressed understanding.

## 2017-01-12 ENCOUNTER — Ambulatory Visit (INDEPENDENT_AMBULATORY_CARE_PROVIDER_SITE_OTHER): Payer: Medicare Other | Admitting: Internal Medicine

## 2017-01-12 ENCOUNTER — Encounter: Payer: Self-pay | Admitting: Internal Medicine

## 2017-01-12 VITALS — BP 122/78 | HR 69 | Ht <= 58 in | Wt 168.0 lb

## 2017-01-12 DIAGNOSIS — M25561 Pain in right knee: Secondary | ICD-10-CM | POA: Diagnosis not present

## 2017-01-12 DIAGNOSIS — I10 Essential (primary) hypertension: Secondary | ICD-10-CM | POA: Diagnosis not present

## 2017-01-12 DIAGNOSIS — S8012XA Contusion of left lower leg, initial encounter: Secondary | ICD-10-CM | POA: Diagnosis not present

## 2017-01-12 MED ORDER — DICLOFENAC SODIUM 1 % TD GEL: 4.0000 g | Freq: Four times a day (QID) | TRANSDERMAL | 5 refills | Status: DC | PRN

## 2017-01-12 NOTE — Progress Notes (Signed)
Subjective:    Patient ID: Latasha Wang, female    DOB: 16-Nov-1940, 76 y.o.   MRN: [id number]  HPI  Here with daughter after made appt a few days ago to examine a bruise to the left distal leg about 1 cm medially above the ankle, but as she is here today the lesions has only faint brown/green and near healed without worsening pain, swelling, ulceration, redness or fever.  Since she is here, she also c/o 2 mo worsening daily right knee pain with intermittent swelling but no giveaways or falls.  Pt denies chest pain, increased sob or doe, wheezing, orthopnea, PND, increased LE swelling, palpitations, dizziness or syncope.  Pt denies new neurological symptoms such as new headache, or facial or extremity weakness or numbness  Pt denies polydipsia, polyuria Past Medical History:  Diagnosis Date  . ANXIETY   . DEPRESSION   . Diabetes mellitus   . Essential hypertension 05/09/2007   Qualifier: Diagnosis of  By: Nena Jordan   . FATTY LIVER DISEASE   . Gastroparesis   . GERD   . HYPERLIPIDEMIA   . HYPERTENSION   . LATERAL EPICONDYLITIS, RIGHT    Past Surgical History:  Procedure Laterality Date  . CATARACT EXTRACTION, BILATERAL    . TOTAL ELBOW ARTHROPLASTY  02/19/2012   Procedure: TOTAL ELBOW ARTHROPLASTY;  Surgeon: Budd Palmer, MD;  Location: [facility];  Service: Orthopedics;  Laterality: Left;    reports that she has never smoked. She has never used smokeless tobacco. She reports that she does not drink alcohol or use drugs. family history is not on file. No Known Allergies Current Outpatient Prescriptions on File Prior to Visit  Medication Sig Dispense Refill  . acetaminophen (TYLENOL) 500 MG tablet Take 1-2 tablets (500-1,000 mg total) by mouth every 6 (six) hours as needed for pain or fever. For pain 90 tablet 0  . albuterol (PROVENTIL HFA;VENTOLIN HFA) 108 (90 BASE) MCG/ACT inhaler Inhale 2 puffs into the lungs every 6 (six) hours as needed for wheezing. 1 Inhaler 0  .  alendronate (FOSAMAX) 70 MG tablet Take 1 tablet (70 mg total) by mouth every 7 (seven) days. Take with a full glass of water on an empty stomach. 12 tablet 3  . amLODipine (NORVASC) 10 MG tablet TAKE 1 TABLET BY MOUTH EVERY DAY 90 tablet 1  . buPROPion (WELLBUTRIN XL) 150 MG 24 hr tablet Take 1 tablet (150 mg total) by mouth daily. 90 tablet 2  . chlorhexidine (PERIDEX) 0.12 % solution Use as directed 30 mLs in the mouth or throat 3 (three) times daily.    . clonazePAM (KLONOPIN) 0.5 MG tablet Take 1 tablet (0.5 mg total) by mouth 2 (two) times daily. As needed 60 tablet 2  . FLUoxetine (PROZAC) 40 MG capsule TAKE ONE CAPSULE BY MOUTH DAILY 90 capsule 3  . hydrochlorothiazide (MICROZIDE) 12.5 MG capsule TAKE ONE CAPSULE BY MOUTH EVERY DAY 90 capsule 1  . irbesartan (AVAPRO) 300 MG tablet TAKE 1 TABLET (300 MG TOTAL) BY MOUTH AT BEDTIME. 90 tablet 1  . ketoconazole (NIZORAL) 2 % shampoo Apply 1 application topically every Friday.  4  . meclizine (ANTIVERT) 12.5 MG tablet TAKE 1 TO 2 TABLETS BY MOUTH EVERY 6 HOURS AS NEEDED 30 tablet 2  . metFORMIN (GLUCOPHAGE-XR) 500 MG 24 hr tablet TAKE 1 TABLET BY MOUTH TWICE A DAY 180 tablet 1  . pioglitazone (ACTOS) 45 MG tablet TAKE 1 TABLET (45 MG TOTAL) BY MOUTH DAILY. 90  tablet 2  . rosuvastatin (CRESTOR) 10 MG tablet TAKE 1 TABLET (10 MG TOTAL) BY MOUTH EVERY EVENING. ANNUAL APPT DUE IN JUNE MUST SEE MD FOR REFILLS 30 tablet 0   No current facility-administered medications on file prior to visit.    Review of Systems  Constitutional: Negative for other unusual diaphoresis or sweats HENT: Negative for ear discharge or swelling Eyes: Negative for other worsening visual disturbances Respiratory: Negative for stridor or other swelling  Gastrointestinal: Negative for worsening distension or other blood Genitourinary: Negative for retention or other urinary change Musculoskeletal: Negative for other MSK pain or swelling Skin: Negative for color change or  other new lesions Neurological: Negative for worsening tremors and other numbness  Psychiatric/Behavioral: Negative for worsening agitation or other fatigue All other system neg per pt    Objective:   Physical Exam BP 122/78   Pulse 69   Ht 4\' 10"  (1.473 m)   Wt 168 lb (76.2 kg)   PF 99 L/min   BMI 35.11 kg/m  VS noted, not ill appearing Constitutional: Pt appears in NAD HENT: Head: NCAT.  Right Ear: External ear normal.  Left Ear: External ear normal.  Eyes: . Pupils are equal, round, and reactive to light. Conjunctivae and EOM are normal Nose: without d/c or deformity Neck: Neck supple. Gross normal ROM Cardiovascular: Normal rate and regular rhythm.   Pulmonary/Chest: Effort normal and breath sounds without rales or wheezing.  Right knee with mod degenerative changes and trace effusion, reduced ROM Neurological: Pt is alert. At baseline orientation, motor grossly intact Skin: Skin is warm. No rashes, other new lesions, no LE edema Psychiatric: Pt behavior is normal without agitation  No other exam findings    Assessment & Plan:

## 2017-01-12 NOTE — Patient Instructions (Signed)
Please take all new medication as prescribed - the topical anti-inflammatory gel for pain  Please see Dr Smith/sports medicine if not improved in the next week  Please continue all other medications as before, and refills have been done if requested.  Please have the pharmacy call with any other refills you may need.  Please keep your appointments with your specialists as you may have planned

## 2017-01-13 ENCOUNTER — Other Ambulatory Visit: Payer: Self-pay | Admitting: Internal Medicine

## 2017-01-13 NOTE — Assessment & Plan Note (Signed)
D/w pt, heealing, reassured, non specific tx or further evaluation is needed

## 2017-01-13 NOTE — Assessment & Plan Note (Signed)
moderate, for volt gel prn, refer sports medicine,  to f/u any worsening symptoms or concerns

## 2017-01-13 NOTE — Assessment & Plan Note (Signed)
stable overall by history and exam, recent data reviewed with pt, and pt to continue medical treatment as before,  to f/u any worsening symptoms or concerns BP Readings from Last 3 Encounters:  01/12/17 122/78  12/15/16 128/78  06/09/16 134/78

## 2017-01-18 ENCOUNTER — Encounter: Payer: Self-pay | Admitting: Internal Medicine

## 2017-01-29 ENCOUNTER — Other Ambulatory Visit: Payer: Self-pay | Admitting: Internal Medicine

## 2017-02-06 ENCOUNTER — Other Ambulatory Visit: Payer: Self-pay | Admitting: Internal Medicine

## 2017-04-14 ENCOUNTER — Other Ambulatory Visit: Payer: Self-pay | Admitting: Internal Medicine

## 2017-04-17 ENCOUNTER — Other Ambulatory Visit: Payer: Self-pay | Admitting: Internal Medicine

## 2017-04-27 ENCOUNTER — Other Ambulatory Visit: Payer: Self-pay | Admitting: Internal Medicine

## 2017-04-28 NOTE — Telephone Encounter (Signed)
Faxed

## 2017-04-28 NOTE — Telephone Encounter (Signed)
Done hardcopy to Shirron  

## 2017-05-06 ENCOUNTER — Other Ambulatory Visit: Payer: Self-pay | Admitting: Internal Medicine

## 2017-06-02 ENCOUNTER — Other Ambulatory Visit: Payer: Self-pay | Admitting: Internal Medicine

## 2017-06-07 ENCOUNTER — Other Ambulatory Visit (INDEPENDENT_AMBULATORY_CARE_PROVIDER_SITE_OTHER): Payer: Medicare Other

## 2017-06-07 DIAGNOSIS — E118 Type 2 diabetes mellitus with unspecified complications: Secondary | ICD-10-CM

## 2017-06-07 LAB — BASIC METABOLIC PANEL
BUN: 19 mg/dL (ref 6–23)
CO2: 29 mEq/L (ref 19–32)
CREATININE: 1.11 mg/dL (ref 0.40–1.20)
Calcium: 9.1 mg/dL (ref 8.4–10.5)
Chloride: 104 mEq/L (ref 96–112)
GFR: 50.86 mL/min — AB (ref >60.00)
Glucose, Bld: 105 mg/dL — ABNORMAL HIGH (ref 70–99)
Potassium: 3.8 mEq/L (ref 3.5–5.1)
Sodium: 142 mEq/L (ref 135–145)

## 2017-06-07 LAB — HEPATIC FUNCTION PANEL
ALK PHOS: 51 U/L (ref 39–117)
ALT: 11 U/L (ref 0–35)
AST: 15 U/L (ref 0–37)
Albumin: 4.2 g/dL (ref 3.5–5.2)
BILIRUBIN DIRECT: 0.2 mg/dL (ref 0.0–0.3)
TOTAL PROTEIN: 7.1 g/dL (ref 6.0–8.3)
Total Bilirubin: 1.2 mg/dL (ref 0.2–1.2)

## 2017-06-07 LAB — LIPID PANEL
CHOLESTEROL: 132 mg/dL (ref 0–200)
HDL: 61 mg/dL (ref >39.00)
LDL CALC: 48 mg/dL (ref 0–99)
NONHDL: 70.55
Total CHOL/HDL Ratio: 2
Triglycerides: 115 mg/dL (ref 0.0–149.0)
VLDL: 23 mg/dL (ref 0.0–40.0)

## 2017-06-07 LAB — HEMOGLOBIN A1C: HEMOGLOBIN A1C: 6.6 % — AB (ref 4.6–6.5)

## 2017-06-10 ENCOUNTER — Encounter: Payer: Self-pay | Admitting: Internal Medicine

## 2017-06-10 ENCOUNTER — Ambulatory Visit: Payer: Medicare Other | Admitting: Internal Medicine

## 2017-06-10 VITALS — BP 118/76 | HR 74 | Temp 97.9°F | Ht <= 58 in | Wt 170.0 lb

## 2017-06-10 DIAGNOSIS — Z23 Encounter for immunization: Secondary | ICD-10-CM | POA: Diagnosis not present

## 2017-06-10 DIAGNOSIS — E785 Hyperlipidemia, unspecified: Secondary | ICD-10-CM | POA: Diagnosis not present

## 2017-06-10 DIAGNOSIS — E118 Type 2 diabetes mellitus with unspecified complications: Secondary | ICD-10-CM | POA: Diagnosis not present

## 2017-06-10 DIAGNOSIS — I10 Essential (primary) hypertension: Secondary | ICD-10-CM

## 2017-06-10 NOTE — Assessment & Plan Note (Signed)
Lab Results  Component Value Date   LDLCALC 48 06/07/2017  stable overall by history and exam, recent data reviewed with pt, and pt to continue medical treatment as before,  to f/u any worsening symptoms or concerns

## 2017-06-10 NOTE — Assessment & Plan Note (Signed)
Lab Results  Component Value Date   HGBA1C 6.6 (H) 06/07/2017   stable overall by history and exam, recent data reviewed with pt, and pt to continue medical treatment as before,  to f/u any worsening symptoms or concerns

## 2017-06-10 NOTE — Patient Instructions (Addendum)
You had the flu shot today  Please continue all other medications as before, and refills have been done if requested.  Please have the pharmacy call with any other refills you may need.  Please continue your efforts at being more active, low cholesterol diet, and weight control.  Please keep your appointments with your specialists as you may have planned  Please return in 6 months, or sooner if needed, with Lab testing done 3-5 days before  

## 2017-06-10 NOTE — Assessment & Plan Note (Signed)
stable overall by history and exam, recent data reviewed with pt, and pt to continue medical treatment as before,  to f/u any worsening symptoms or concerns BP Readings from Last 3 Encounters:  06/10/17 118/76  01/12/17 122/78  12/15/16 128/78

## 2017-06-10 NOTE — Progress Notes (Signed)
Subjective:    Patient ID: Latasha Wang, female    DOB: 04/28/1941, 76 y.o.   MRN: [id number]  HPI  Here to f/u; overall doing ok,  Pt denies chest pain, increasing sob or doe, wheezing, orthopnea, PND, increased LE swelling, palpitations, dizziness or syncope.  Pt denies new neurological symptoms such as new headache, or facial or extremity weakness or numbness.  Pt denies polydipsia, polyuria, or low sugar episode.  Pt states overall good compliance with meds, mostly trying to follow appropriate diet, with wt overall stable,  but little exercise however.  Plans to try to do better.  No other interval hx or complaints Past Medical History:  Diagnosis Date  . ANXIETY   . DEPRESSION   . Diabetes mellitus   . Essential hypertension 05/09/2007   Qualifier: Diagnosis of  By: Nena JordanYoo DO, D. Robert   . FATTY LIVER DISEASE   . Gastroparesis   . GERD   . HYPERLIPIDEMIA   . HYPERTENSION   . LATERAL EPICONDYLITIS, RIGHT    Past Surgical History:  Procedure Laterality Date  . CATARACT EXTRACTION, BILATERAL    . TOTAL ELBOW ARTHROPLASTY  02/19/2012   Procedure: TOTAL ELBOW ARTHROPLASTY;  Surgeon: Budd PalmerMichael H Handy, MD;  Location: [facility] OR;  Service: Orthopedics;  Laterality: Left;    reports that  has never smoked. she has never used smokeless tobacco. She reports that she does not drink alcohol or use drugs. family history is not on file. No Known Allergies Current Outpatient Medications on File Prior to Visit  Medication Sig Dispense Refill  . acetaminophen (TYLENOL) 500 MG tablet Take 1-2 tablets (500-1,000 mg total) by mouth every 6 (six) hours as needed for pain or fever. For pain 90 tablet 0  . albuterol (PROVENTIL HFA;VENTOLIN HFA) 108 (90 BASE) MCG/ACT inhaler Inhale 2 puffs into the lungs every 6 (six) hours as needed for wheezing. 1 Inhaler 0  . alendronate (FOSAMAX) 70 MG tablet Take 1 tablet (70 mg total) by mouth every 7 (seven) days. Take with a full glass of water on an empty stomach. 12  tablet 3  . amLODipine (NORVASC) 10 MG tablet TAKE 1 TABLET BY MOUTH EVERY DAY 90 tablet 1  . buPROPion (WELLBUTRIN XL) 150 MG 24 hr tablet Take 1 tablet (150 mg total) by mouth daily. 90 tablet 2  . chlorhexidine (PERIDEX) 0.12 % solution Use as directed 30 mLs in the mouth or throat 3 (three) times daily.    . clonazePAM (KLONOPIN) 0.5 MG tablet TAKE 1 TABLET BY MOUTH TWICE A DAY AS NEEDED 60 tablet 2  . diclofenac sodium (VOLTAREN) 1 % GEL Apply 4 g topically 4 (four) times daily as needed. 100 g 5  . FLUoxetine (PROZAC) 40 MG capsule TAKE ONE CAPSULE BY MOUTH DAILY 90 capsule 2  . hydrochlorothiazide (MICROZIDE) 12.5 MG capsule TAKE ONE CAPSULE BY MOUTH EVERY DAY 90 capsule 2  . irbesartan (AVAPRO) 300 MG tablet TAKE 1 TABLET (300 MG TOTAL) BY MOUTH AT BEDTIME. 90 tablet 1  . ketoconazole (NIZORAL) 2 % shampoo Apply 1 application topically every Friday.  4  . meclizine (ANTIVERT) 12.5 MG tablet TAKE 1 TO 2 TABLETS BY MOUTH EVERY 6 HOURS AS NEEDED 30 tablet 2  . metFORMIN (GLUCOPHAGE-XR) 500 MG 24 hr tablet TAKE 1 TABLET BY MOUTH TWICE A DAY 180 tablet 2  . OLANZapine (ZYPREXA) 5 MG tablet TAKE 1 TABLET EVERY DAY 90 tablet 1  . pioglitazone (ACTOS) 45 MG tablet TAKE 1 TABLET (  45 MG TOTAL) BY MOUTH DAILY. 90 tablet 2  . rosuvastatin (CRESTOR) 10 MG tablet Take 1 tablet (10 mg total) by mouth every evening. 90 tablet 3   No current facility-administered medications on file prior to visit.    Review of Systems  Constitutional: Negative for other unusual diaphoresis or sweats HENT: Negative for ear discharge or swelling Eyes: Negative for other worsening visual disturbances Respiratory: Negative for stridor or other swelling  Gastrointestinal: Negative for worsening distension or other blood Genitourinary: Negative for retention or other urinary change Musculoskeletal: Negative for other MSK pain or swelling Skin: Negative for color change or other new lesions Neurological: Negative for  worsening tremors and other numbness  Psychiatric/Behavioral: Negative for worsening agitation or other fatigue All other system neg per pt    Objective:   Physical Exam BP 118/76   Pulse 74   Temp 97.9 F (36.6 C) (Oral)   Ht 4\' 10"  (1.473 m)   Wt 170 lb (77.1 kg)   SpO2 98%   BMI 35.53 kg/m  VS noted,  Constitutional: Pt appears in NAD HENT: Head: NCAT.  Right Ear: External ear normal.  Left Ear: External ear normal.  Eyes: . Pupils are equal, round, and reactive to light. Conjunctivae and EOM are normal Nose: without d/c or deformity Neck: Neck supple. Gross normal ROM Cardiovascular: Normal rate and regular rhythm.   Pulmonary/Chest: Effort normal and breath sounds without rales or wheezing.  Neurological: Pt is alert. At baseline orientation, motor grossly intact Skin: Skin is warm. No rashes, other new lesions, no LE edema Psychiatric: Pt behavior is normal without agitation  No other exam findings Lab Results  Component Value Date   WBC 4.3 11/30/2016   HGB 12.0 11/30/2016   HCT 36.7 11/30/2016   PLT 200.0 11/30/2016   GLUCOSE 105 (H) 06/07/2017   CHOL 132 06/07/2017   TRIG 115.0 06/07/2017   HDL 61.00 06/07/2017   LDLDIRECT 85.1 08/19/2006   LDLCALC 48 06/07/2017   ALT 11 06/07/2017   AST 15 06/07/2017   NA 142 06/07/2017   K 3.8 06/07/2017   CL 104 06/07/2017   CREATININE 1.11 06/07/2017   BUN 19 06/07/2017   CO2 29 06/07/2017   TSH 1.19 11/30/2016   INR 0.91 02/19/2012   HGBA1C 6.6 (H) 06/07/2017   MICROALBUR 0.8 11/30/2016      Assessment & Plan:

## 2017-08-04 ENCOUNTER — Other Ambulatory Visit: Payer: Self-pay | Admitting: Internal Medicine

## 2017-08-09 ENCOUNTER — Other Ambulatory Visit: Payer: Self-pay | Admitting: Internal Medicine

## 2017-08-09 MED ORDER — FLUOXETINE HCL 40 MG PO CAPS: 40.0000 mg | ORAL_CAPSULE | Freq: Every day | ORAL | 2 refills | Status: DC

## 2017-08-22 ENCOUNTER — Other Ambulatory Visit: Payer: Self-pay | Admitting: Internal Medicine

## 2017-08-23 NOTE — Telephone Encounter (Signed)
Done erx 

## 2017-08-31 ENCOUNTER — Other Ambulatory Visit: Payer: Self-pay | Admitting: Adult Health

## 2017-09-10 ENCOUNTER — Other Ambulatory Visit: Payer: Self-pay | Admitting: Internal Medicine

## 2017-10-28 ENCOUNTER — Other Ambulatory Visit: Payer: Self-pay

## 2017-10-28 NOTE — Patient Outreach (Signed)
[facility]) Care Management  10/28/2017  Lossie T Sustaita Feb 09, 1941 [id number]   Medication Adherence call to Mrs. Alexsa Camino patient is showing past due  under [place] [employer]on Rosuvastatin 10 mg spoke with patient daughter she said she already pick this medication from [facility] and Mrs. Massoud takes on a regular basis she also has a box and  is fill every week.  Lillia Abed CPhT Pharmacy Technician [facility] Care Management Direct Dial [phone]  Fax [phone] [email or web address]

## 2017-11-25 DIAGNOSIS — Z79899 Other long term (current) drug therapy: Secondary | ICD-10-CM | POA: Diagnosis not present

## 2017-11-25 DIAGNOSIS — M545 Low back pain: Secondary | ICD-10-CM | POA: Diagnosis not present

## 2017-11-25 DIAGNOSIS — M129 Arthropathy, unspecified: Secondary | ICD-10-CM | POA: Diagnosis not present

## 2017-11-25 DIAGNOSIS — E559 Vitamin D deficiency, unspecified: Secondary | ICD-10-CM | POA: Diagnosis not present

## 2017-11-25 DIAGNOSIS — M79602 Pain in left arm: Secondary | ICD-10-CM | POA: Diagnosis not present

## 2017-12-06 ENCOUNTER — Other Ambulatory Visit: Payer: Self-pay | Admitting: Internal Medicine

## 2017-12-06 ENCOUNTER — Other Ambulatory Visit (INDEPENDENT_AMBULATORY_CARE_PROVIDER_SITE_OTHER): Payer: Self-pay

## 2017-12-06 DIAGNOSIS — E118 Type 2 diabetes mellitus with unspecified complications: Secondary | ICD-10-CM

## 2017-12-06 DIAGNOSIS — M542 Cervicalgia: Secondary | ICD-10-CM | POA: Diagnosis not present

## 2017-12-06 DIAGNOSIS — E785 Hyperlipidemia, unspecified: Secondary | ICD-10-CM

## 2017-12-06 DIAGNOSIS — I10 Essential (primary) hypertension: Secondary | ICD-10-CM

## 2017-12-06 DIAGNOSIS — M545 Low back pain: Secondary | ICD-10-CM | POA: Diagnosis not present

## 2017-12-06 DIAGNOSIS — Z79899 Other long term (current) drug therapy: Secondary | ICD-10-CM | POA: Diagnosis not present

## 2017-12-06 DIAGNOSIS — M129 Arthropathy, unspecified: Secondary | ICD-10-CM | POA: Diagnosis not present

## 2017-12-06 LAB — BASIC METABOLIC PANEL
BUN: 22 mg/dL (ref 6–23)
CALCIUM: 9.9 mg/dL (ref 8.4–10.5)
CO2: 28 mEq/L (ref 19–32)
CREATININE: 0.97 mg/dL (ref 0.40–1.20)
Chloride: 103 mEq/L (ref 96–112)
GFR: 59.34 mL/min — AB (ref >60.00)
Glucose, Bld: 108 mg/dL — ABNORMAL HIGH (ref 70–99)
Potassium: 3.8 mEq/L (ref 3.5–5.1)
Sodium: 142 mEq/L (ref 135–145)

## 2017-12-06 LAB — CBC WITH DIFFERENTIAL/PLATELET
BASOS ABS: 0 10*3/uL (ref 0.0–0.1)
Basophils Relative: 0.8 % (ref 0.0–3.0)
Eosinophils Absolute: 0.1 10*3/uL (ref 0.0–0.7)
Eosinophils Relative: 2.2 % (ref 0.0–5.0)
HCT: 35.7 % — ABNORMAL LOW (ref 36.0–46.0)
HEMOGLOBIN: 11.7 g/dL — AB (ref 12.0–15.0)
LYMPHS ABS: 2.3 10*3/uL (ref 0.7–4.0)
Lymphocytes Relative: 45.2 % (ref 12.0–46.0)
MCHC: 32.7 g/dL (ref 30.0–36.0)
MCV: 88.6 fl (ref 78.0–100.0)
MONO ABS: 0.3 10*3/uL (ref 0.1–1.0)
MONOS PCT: 6.7 % (ref 3.0–12.0)
NEUTROS PCT: 45.1 % (ref 43.0–77.0)
Neutro Abs: 2.3 10*3/uL (ref 1.4–7.7)
Platelets: 219 10*3/uL (ref 150.0–400.0)
RBC: 4.03 Mil/uL (ref 3.87–5.11)
RDW: 16.2 % — ABNORMAL HIGH (ref 11.5–15.5)
WBC: 5 10*3/uL (ref 4.0–10.5)

## 2017-12-06 LAB — URINALYSIS, ROUTINE W REFLEX MICROSCOPIC
Bilirubin Urine: NEGATIVE
HGB URINE DIPSTICK: NEGATIVE
Ketones, ur: NEGATIVE
Leukocytes, UA: NEGATIVE
Nitrite: NEGATIVE
RBC / HPF: NONE SEEN (ref 0–?)
Specific Gravity, Urine: >=1.03 — AB (ref 1.000–1.030)
Total Protein, Urine: NEGATIVE
URINE GLUCOSE: NEGATIVE
Urobilinogen, UA: 0.2 (ref 0.0–1.0)
pH: 5.5 (ref 5.0–8.0)

## 2017-12-06 LAB — HEMOGLOBIN A1C: HEMOGLOBIN A1C: 6.5 % (ref 4.6–6.5)

## 2017-12-06 LAB — LIPID PANEL
CHOLESTEROL: 145 mg/dL (ref 0–200)
HDL: 66.7 mg/dL (ref >39.00)
LDL Cholesterol: 59 mg/dL (ref 0–99)
NonHDL: 78.41
Total CHOL/HDL Ratio: 2
Triglycerides: 97 mg/dL (ref 0.0–149.0)
VLDL: 19.4 mg/dL (ref 0.0–40.0)

## 2017-12-06 LAB — HEPATIC FUNCTION PANEL
ALK PHOS: 59 U/L (ref 39–117)
ALT: 12 U/L (ref 0–35)
AST: 16 U/L (ref 0–37)
Albumin: 4.3 g/dL (ref 3.5–5.2)
Bilirubin, Direct: 0.2 mg/dL (ref 0.0–0.3)
Total Bilirubin: 1.2 mg/dL (ref 0.2–1.2)
Total Protein: 7.1 g/dL (ref 6.0–8.3)

## 2017-12-06 LAB — MICROALBUMIN / CREATININE URINE RATIO
CREATININE, U: 127.2 mg/dL
MICROALB UR: 1.3 mg/dL (ref 0.0–1.9)
Microalb Creat Ratio: 1 mg/g (ref 0.0–30.0)

## 2017-12-06 LAB — TSH: TSH: 1.77 u[IU]/mL (ref 0.35–4.50)

## 2017-12-10 ENCOUNTER — Ambulatory Visit (INDEPENDENT_AMBULATORY_CARE_PROVIDER_SITE_OTHER): Payer: Medicare Other | Admitting: Internal Medicine

## 2017-12-10 ENCOUNTER — Encounter: Payer: Self-pay | Admitting: Internal Medicine

## 2017-12-10 VITALS — BP 140/80 | HR 74 | Temp 98.3°F | Ht <= 58 in | Wt 166.5 lb

## 2017-12-10 DIAGNOSIS — M79622 Pain in left upper arm: Secondary | ICD-10-CM | POA: Diagnosis not present

## 2017-12-10 DIAGNOSIS — Z0000 Encounter for general adult medical examination without abnormal findings: Secondary | ICD-10-CM

## 2017-12-10 DIAGNOSIS — H6121 Impacted cerumen, right ear: Secondary | ICD-10-CM | POA: Insufficient documentation

## 2017-12-10 DIAGNOSIS — E118 Type 2 diabetes mellitus with unspecified complications: Secondary | ICD-10-CM

## 2017-12-10 MED ORDER — TRAMADOL HCL 50 MG PO TABS: 50.0000 mg | ORAL_TABLET | Freq: Four times a day (QID) | ORAL | 0 refills | Status: DC | PRN

## 2017-12-10 MED ORDER — MECLIZINE HCL 12.5 MG PO TABS: 12.5000 mg | ORAL_TABLET | Freq: Four times a day (QID) | ORAL | 2 refills | Status: DC | PRN

## 2017-12-10 NOTE — Patient Instructions (Addendum)
Please remember to make your yearly eye doctor visit  Please remember to obtain an Ear Wax Removal Kit at the pharmacy, and work on clearing the wax in the right ear, as this might help with the dizziness  Please take all new medication as prescribed - the pain medication  Please continue all other medications as before, and refills have been done if requested - the meclizine for dizziness as needed  Please have the pharmacy call with any other refills you may need.  Please continue your efforts at being more active, low cholesterol diet, and weight control.  You are otherwise up to date with prevention measures today.  Please keep your appointments with your specialists as you may have planned  Please return in 6 months, or sooner if needed, with Lab testing done 3-5 days before

## 2017-12-10 NOTE — Progress Notes (Signed)
Subjective:    Patient ID: Latasha Wang, female    DOB: 10-25-1940, 77 y.o.   MRN: [id number]  HPI  Here for wellness and f/u;  Overall doing ok;  Pt denies Chest pain, worsening SOB, DOE, wheezing, orthopnea, PND, worsening LE edema, palpitations, dizziness or syncope.  Pt denies neurological change such as new headache, facial or extremity weakness.  Pt denies polydipsia, polyuria, or low sugar symptoms. Pt states overall good compliance with treatment and medications, good tolerability, and has been trying to follow appropriate diet.  Pt denies worsening depressive symptoms, suicidal ideation or panic. No fever, night sweats, wt loss, loss of appetite, or other constitutional symptoms.  Pt states good ability with ADL's, has low fall risk, home safety reviewed and adequate, no other significant changes in hearing or vision, and only occasionally active with exercise, usually [facility] center mostly walking.  No other interval hx or new complaints except has a pain to the left bicep area after fall with slight sweling, and vertigo has been more active in the last 2 wks. Past Medical History:  Diagnosis Date  . ANXIETY   . DEPRESSION   . Diabetes mellitus   . Essential hypertension 05/09/2007   Qualifier: Diagnosis of  By: Wynona Luna   . FATTY LIVER DISEASE   . Gastroparesis   . GERD   . HYPERLIPIDEMIA   . HYPERTENSION   . LATERAL EPICONDYLITIS, RIGHT    Past Surgical History:  Procedure Laterality Date  . CATARACT EXTRACTION, BILATERAL    . TOTAL ELBOW ARTHROPLASTY  02/19/2012   Procedure: TOTAL ELBOW ARTHROPLASTY;  Surgeon: Rozanna Box, MD;  Location: [facility];  Service: Orthopedics;  Laterality: Left;    reports that she has never smoked. She has never used smokeless tobacco. She reports that she does not drink alcohol or use drugs. family history is not on file. No Known Allergies Current Outpatient Medications on File Prior to Visit  Medication Sig Dispense  Refill  . acetaminophen (TYLENOL) 500 MG tablet Take 1-2 tablets (500-1,000 mg total) by mouth every 6 (six) hours as needed for pain or fever. For pain 90 tablet 0  . albuterol (PROVENTIL HFA;VENTOLIN HFA) 108 (90 BASE) MCG/ACT inhaler Inhale 2 puffs into the lungs every 6 (six) hours as needed for wheezing. 1 Inhaler 0  . alendronate (FOSAMAX) 70 MG tablet Take 1 tablet (70 mg total) by mouth every 7 (seven) days. Take with a full glass of water on an empty stomach. 12 tablet 3  . amLODipine (NORVASC) 10 MG tablet TAKE 1 TABLET BY MOUTH EVERY DAY 90 tablet 1  . buPROPion (WELLBUTRIN XL) 150 MG 24 hr tablet TAKE 1 TABLET BY MOUTH EVERY DAY 90 tablet 0  . chlorhexidine (PERIDEX) 0.12 % solution Use as directed 30 mLs in the mouth or throat 3 (three) times daily.    . clonazePAM (KLONOPIN) 0.5 MG tablet TAKE 1 TABLET BY MOUTH TWICE A DAY AS NEEDED 60 tablet 2  . diclofenac sodium (VOLTAREN) 1 % GEL Apply 4 g topically 4 (four) times daily as needed. 100 g 5  . FLUoxetine (PROZAC) 40 MG capsule Take 1 capsule (40 mg total) by mouth daily. 90 capsule 2  . hydrochlorothiazide (MICROZIDE) 12.5 MG capsule TAKE ONE CAPSULE BY MOUTH EVERY DAY 90 capsule 2  . HYDROcodone-acetaminophen (NORCO) 10-325 MG tablet   0  . irbesartan (AVAPRO) 300 MG tablet TAKE 1 TABLET (300 MG TOTAL) BY MOUTH AT  BEDTIME. 90 tablet 1  . ketoconazole (NIZORAL) 2 % shampoo Apply 1 application topically every Friday.  4  . meclizine (ANTIVERT) 12.5 MG tablet TAKE 1 TO 2 TABLETS BY MOUTH EVERY 6 HOURS AS NEEDED 30 tablet 2  . metFORMIN (GLUCOPHAGE-XR) 500 MG 24 hr tablet TAKE 1 TABLET BY MOUTH TWICE A DAY 180 tablet 2  . NARCAN 4 MG/0.1ML LIQD nasal spray kit   0  . OLANZapine (ZYPREXA) 5 MG tablet TAKE 1 TABLET BY MOUTH EVERY DAY 90 tablet 1  . pioglitazone (ACTOS) 45 MG tablet TAKE 1 TABLET (45 MG TOTAL) BY MOUTH DAILY. 90 tablet 2  . rosuvastatin (CRESTOR) 10 MG tablet Take 1 tablet (10 mg total) by mouth every evening. 90 tablet  3   No current facility-administered medications on file prior to visit.    Review of Systems /Constitutional: Negative for other unusual diaphoresis, sweats, appetite or weight changes HENT: Negative for other worsening hearing loss, ear pain, facial swelling, mouth sores or neck stiffness.   Eyes: Negative for other worsening pain, redness or other visual disturbance.  Respiratory: Negative for other stridor or swelling Cardiovascular: Negative for other palpitations or other chest pain  Gastrointestinal: Negative for worsening diarrhea or loose stools, blood in stool, distention or other pain Genitourinary: Negative for hematuria, flank pain or other change in urine volume.  Musculoskeletal: Negative for myalgias or other joint swelling.  Skin: Negative for other color change, or other wound or worsening drainage.  Neurological: Negative for other syncope or numbness. Hematological: Negative for other adenopathy or swelling Psychiatric/Behavioral: Negative for hallucinations, other worsening agitation, SI, self-injury, or new decreased concentration All other system neg per pt    Objective:   Physical Exam BP 140/80 (BP Location: Left Arm, Patient Position: Sitting, Cuff Size: Normal)   Pulse 74   Temp 98.3 F (36.8 C) (Oral)   Ht 4' 10" (1.473 m)   Wt 166 lb 8 oz (75.5 kg)   SpO2 97%   BMI 34.80 kg/m  VS noted, obese Constitutional: Pt is oriented to person, place, and time. Appears well-developed and well-nourished, in no significant distress and comfortable Head: Normocephalic and atraumatic  Eyes: Conjunctivae and EOM are normal. Pupils are equal, round, and reactive to light Right Ear: External ear normal without discharge; has significant wax right ear Left Ear: External ear normal without discharge Nose: Nose without discharge or deformity Mouth/Throat: Oropharynx is without other ulcerations and moist  Neck: Normal range of motion. Neck supple. No JVD present. No  tracheal deviation present or significant neck LA or mass Cardiovascular: Normal rate, regular rhythm, normal heart sounds and intact distal pulses.  Pulmonary/Chest: WOB normal and breath sounds without rales or wheezing  Abdominal: Soft. Bowel sounds are normal. NT. No HSM  Musculoskeletal: Normal range of motion. Exhibits no edema, with tender mid left bicep without rash, contusion, swelling Lymphadenopathy: Has no other cervical adenopathy.  Neurological: Pt is alert and oriented to person, place, and time. Pt has normal reflexes. No cranial nerve deficit. Motor grossly intact, Gait intact Skin: Skin is warm and dry. No rash noted or new ulcerations Psychiatric:  Has normal mood and affect. Behavior is normal without agitation No other exam findings Lab Results  Component Value Date   WBC 5.0 12/06/2017   HGB 11.7 (L) 12/06/2017   HCT 35.7 (L) 12/06/2017   PLT 219.0 12/06/2017   GLUCOSE 108 (H) 12/06/2017   CHOL 145 12/06/2017   TRIG 97.0 12/06/2017   HDL 66.70  12/06/2017   LDLDIRECT 85.1 08/19/2006   LDLCALC 59 12/06/2017   ALT 12 12/06/2017   AST 16 12/06/2017   NA 142 12/06/2017   K 3.8 12/06/2017   CL 103 12/06/2017   CREATININE 0.97 12/06/2017   BUN 22 12/06/2017   CO2 28 12/06/2017   TSH 1.77 12/06/2017   INR 0.91 02/19/2012   HGBA1C 6.5 12/06/2017   MICROALBUR 1.3 12/06/2017       Assessment & Plan:

## 2017-12-10 NOTE — Assessment & Plan Note (Signed)

## 2017-12-10 NOTE — Addendum Note (Signed)
Addended by: Corwin LevinsJOHN, [place] Knaus W on: 12/10/2017 03:50 PM   Modules accepted: Orders

## 2017-12-10 NOTE — Assessment & Plan Note (Signed)
stable overall by history and exam, recent data reviewed with pt, and pt to continue medical treatment as before,  to f/u any worsening symptoms or concerns  

## 2017-12-10 NOTE — Assessment & Plan Note (Signed)
Encouraged OTC debrox ear wax removal kit

## 2017-12-10 NOTE — Assessment & Plan Note (Signed)
C/w contusion, for pain control,  to f/u any worsening symptoms or concerns

## 2017-12-19 ENCOUNTER — Other Ambulatory Visit: Payer: Self-pay | Admitting: Internal Medicine

## 2017-12-21 DIAGNOSIS — M25511 Pain in right shoulder: Secondary | ICD-10-CM | POA: Diagnosis not present

## 2017-12-21 DIAGNOSIS — M542 Cervicalgia: Secondary | ICD-10-CM | POA: Diagnosis not present

## 2017-12-21 DIAGNOSIS — M545 Low back pain: Secondary | ICD-10-CM | POA: Diagnosis not present

## 2017-12-22 DIAGNOSIS — M542 Cervicalgia: Secondary | ICD-10-CM | POA: Diagnosis not present

## 2017-12-22 DIAGNOSIS — M79602 Pain in left arm: Secondary | ICD-10-CM | POA: Diagnosis not present

## 2017-12-22 DIAGNOSIS — M545 Low back pain: Secondary | ICD-10-CM | POA: Diagnosis not present

## 2017-12-22 DIAGNOSIS — Z79899 Other long term (current) drug therapy: Secondary | ICD-10-CM | POA: Diagnosis not present

## 2017-12-22 DIAGNOSIS — M25511 Pain in right shoulder: Secondary | ICD-10-CM | POA: Diagnosis not present

## 2017-12-23 ENCOUNTER — Other Ambulatory Visit: Payer: Self-pay | Admitting: Internal Medicine

## 2017-12-31 ENCOUNTER — Other Ambulatory Visit: Payer: Self-pay | Admitting: Internal Medicine

## 2017-12-31 NOTE — Telephone Encounter (Signed)
Done erx 

## 2018-01-07 DIAGNOSIS — M25511 Pain in right shoulder: Secondary | ICD-10-CM | POA: Diagnosis not present

## 2018-01-07 DIAGNOSIS — Z79899 Other long term (current) drug therapy: Secondary | ICD-10-CM | POA: Diagnosis not present

## 2018-01-07 DIAGNOSIS — M545 Low back pain: Secondary | ICD-10-CM | POA: Diagnosis not present

## 2018-01-07 DIAGNOSIS — M79602 Pain in left arm: Secondary | ICD-10-CM | POA: Diagnosis not present

## 2018-01-07 DIAGNOSIS — M542 Cervicalgia: Secondary | ICD-10-CM | POA: Diagnosis not present

## 2018-01-10 DIAGNOSIS — H16223 Keratoconjunctivitis sicca, not specified as Sjogren's, bilateral: Secondary | ICD-10-CM | POA: Diagnosis not present

## 2018-01-10 DIAGNOSIS — H11003 Unspecified pterygium of eye, bilateral: Secondary | ICD-10-CM | POA: Diagnosis not present

## 2018-01-10 DIAGNOSIS — E119 Type 2 diabetes mellitus without complications: Secondary | ICD-10-CM | POA: Diagnosis not present

## 2018-01-10 DIAGNOSIS — H353121 Nonexudative age-related macular degeneration, left eye, early dry stage: Secondary | ICD-10-CM | POA: Diagnosis not present

## 2018-01-10 DIAGNOSIS — H04123 Dry eye syndrome of bilateral lacrimal glands: Secondary | ICD-10-CM | POA: Diagnosis not present

## 2018-01-26 ENCOUNTER — Other Ambulatory Visit: Payer: Self-pay | Admitting: Internal Medicine

## 2018-01-27 ENCOUNTER — Other Ambulatory Visit: Payer: Self-pay | Admitting: Internal Medicine

## 2018-01-28 ENCOUNTER — Other Ambulatory Visit: Payer: Self-pay | Admitting: Internal Medicine

## 2018-02-02 IMAGING — MR MR HEAD WO/W CM
10 of 12 series · 35 of 48 positions shown · IV contrast (multihance)
Comparison: CT head 03/09/2016.

CLINICAL DATA: 75 y.o. female admitted on 03/08/2016 with rule out
meningitis - patient has fever of unknown origin and possible dental
space infection. Patient is agitated and confused with neck pain

EXAM:
MRI HEAD WITHOUT AND WITH CONTRAST
TECHNIQUE: Multiplanar, multiecho pulse sequences of the brain and surrounding
structures were obtained without and with intravenous contrast.
CONTRAST:  15mL MULTIHANCE GADOBENATE DIMEGLUMINE 529 MG/ML IV SOLN

[Series 3: DWI · axial · 3.0mm · 0.94mm/px · z∈[-25,+114]mm · 8 of 98 slices shown (1 of 2)]
[im 1/98]
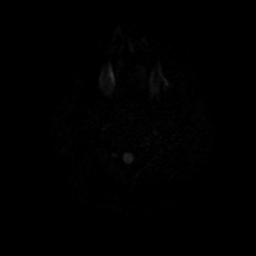
[im 11/98]
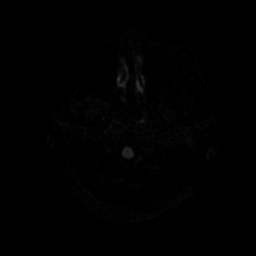
[im 33/98]
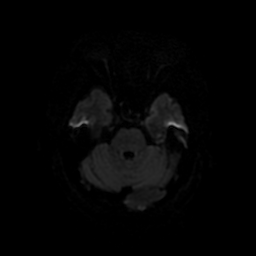
[im 44/98]
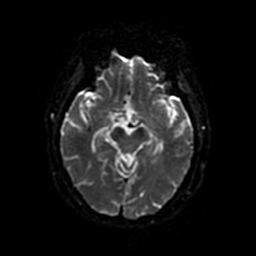
[im 54/98]
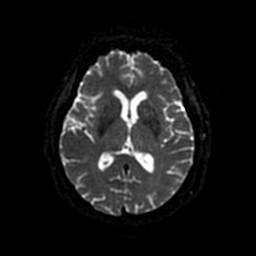
[im 65/98]
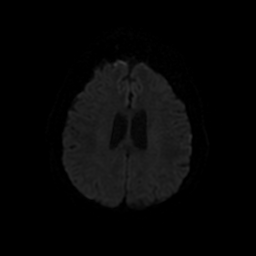
[im 87/98]
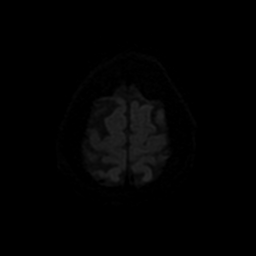
[im 98/98]
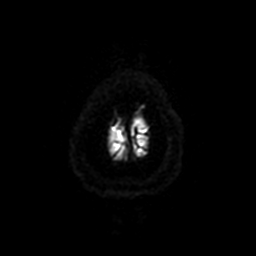

[Series 3: T2 · axial · 5.0mm · 0.47mm/px · z∈[-37,+104]mm · 3 of 25 slices shown (1 of 2)]
[im 1/25]
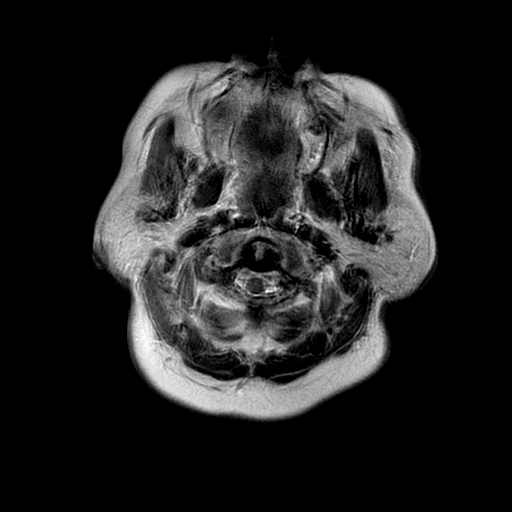
[im 13/25]
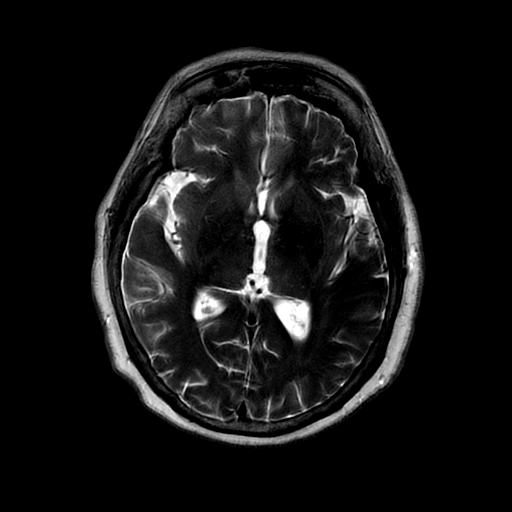
[im 25/25]
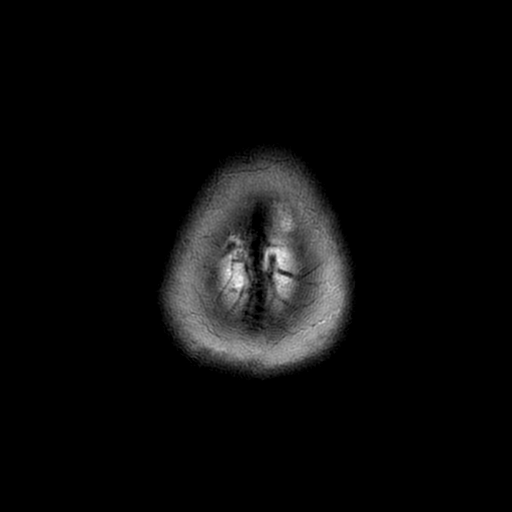

[Series 5: FLAIR · axial · 5.0mm · 0.94mm/px · z∈[-37,+104]mm · 2 of 25 slices shown]
[im 1/25]
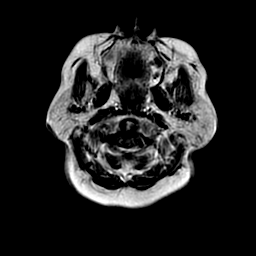
[im 25/25]
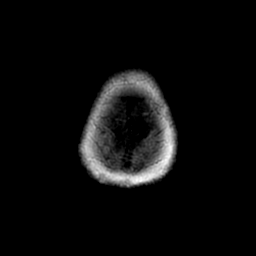

[Series 7: DWI · coronal · 4.0mm · 0.94mm/px · 6 of 64 slices shown (2 of 2)]
[im 1/64]
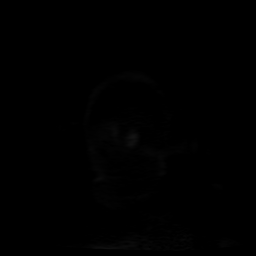
[im 13/64]
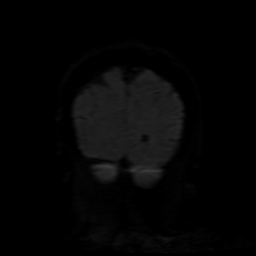
[im 26/64]
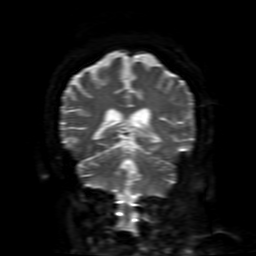
[im 38/64]
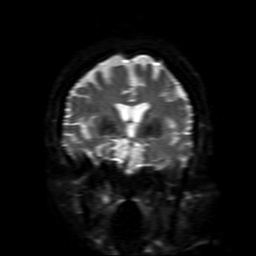
[im 51/64]
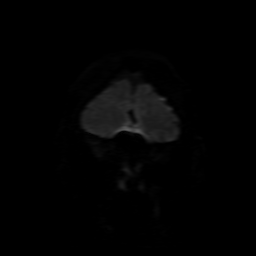
[im 64/64]
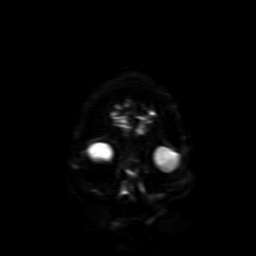

[Series 9: T1 · axial · 5.0mm · 0.47mm/px · z∈[-41,+100]mm · 2 of 25 slices shown (1 of 3)]
[im 1/25]
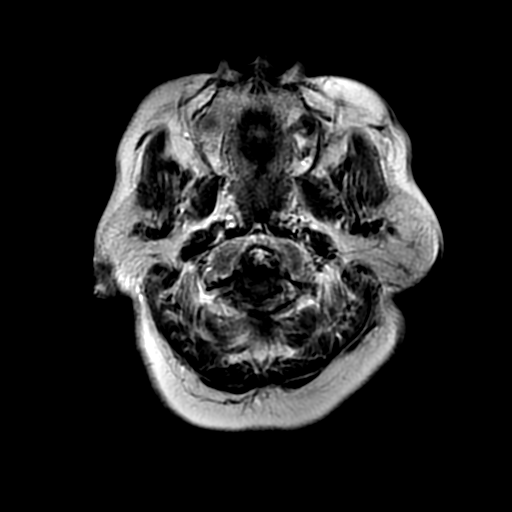
[im 25/25]
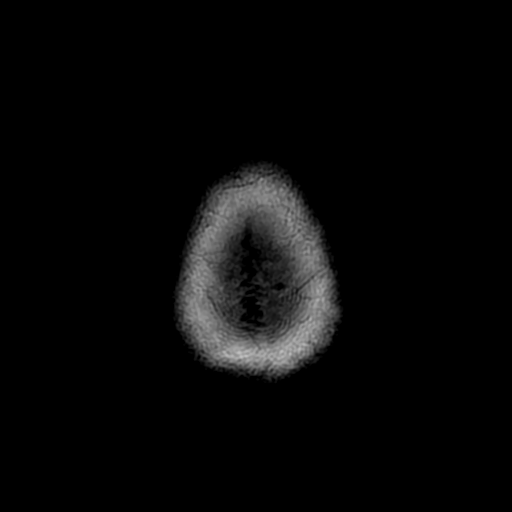

[Series 10: T1 · sagittal · 5.0mm · 0.47mm/px · 2 of 23 slices shown (2 of 3)]
[im 1/23]
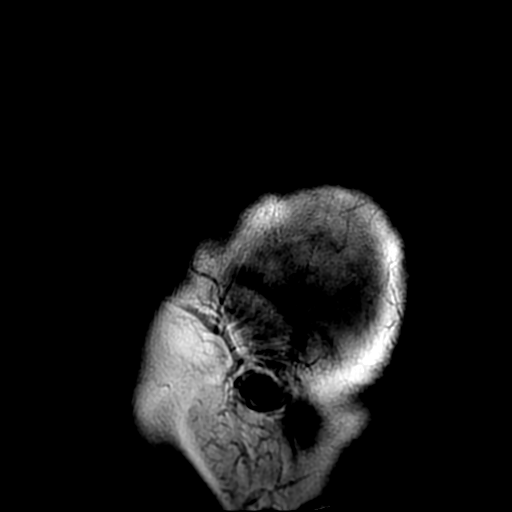
[im 23/23]
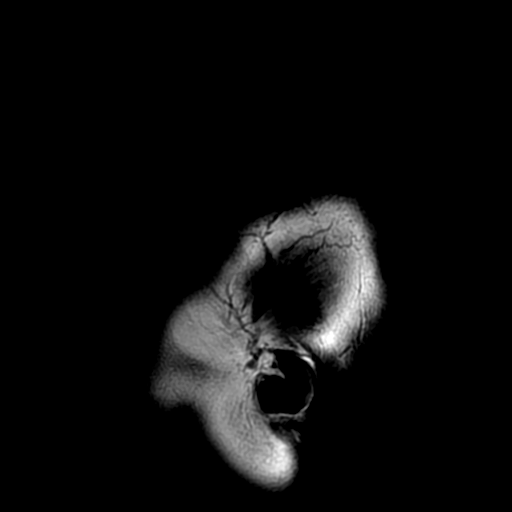

[Series 11: T2 · coronal · 5.0mm · 0.47mm/px · 2 of 27 slices shown (2 of 2)]
[im 1/27]
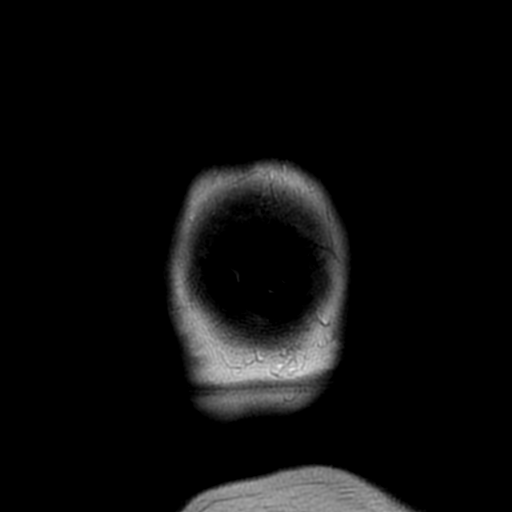
[im 27/27]
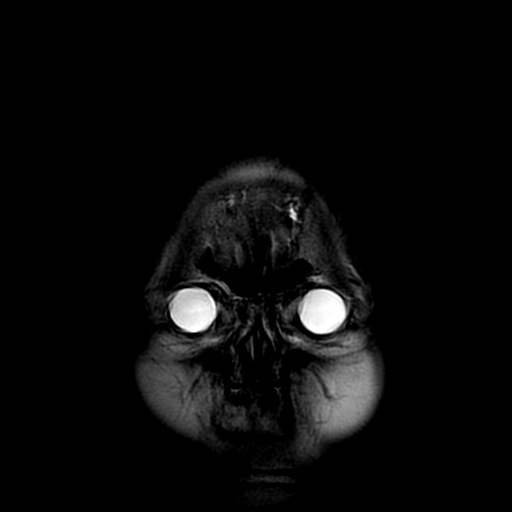

[Series 12: T1 · axial · 5.0mm · 0.47mm/px · z∈[-41,+100]mm · 2 of 25 slices shown (3 of 3)]
[im 1/25]
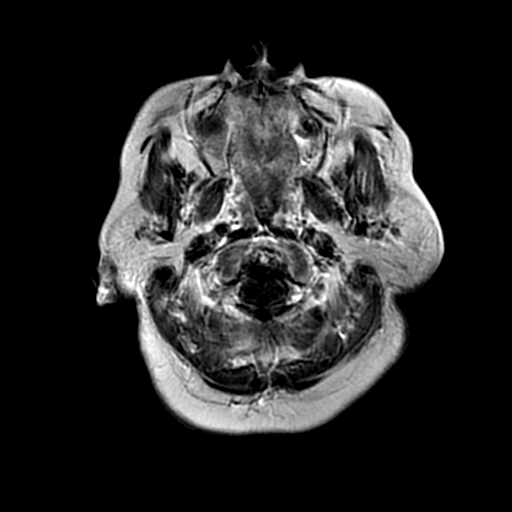
[im 25/25]
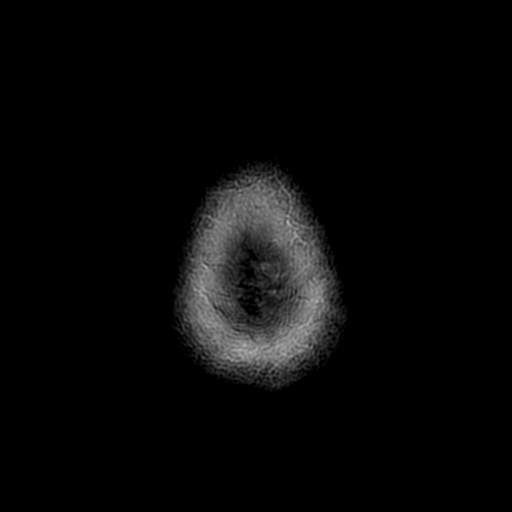

[Series 350: ADC · axial · 3.0mm · 0.94mm/px · z∈[-25,+114]mm · 5 of 49 slices shown (1 of 2)]
[im 1/49]
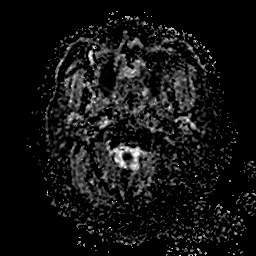
[im 13/49]
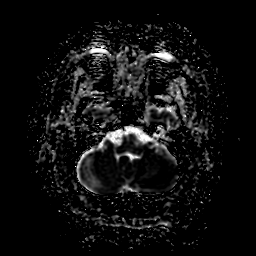
[im 25/49]
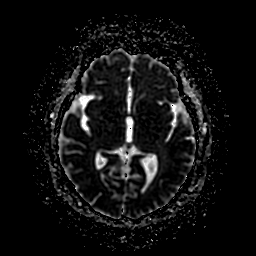
[im 37/49]
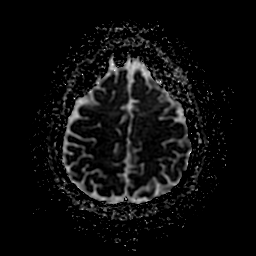
[im 49/49]
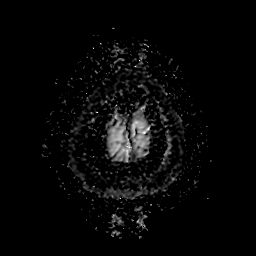

[Series 750: ADC · coronal · 4.0mm · 0.94mm/px · 3 of 32 slices shown (2 of 2)]
[im 1/32]
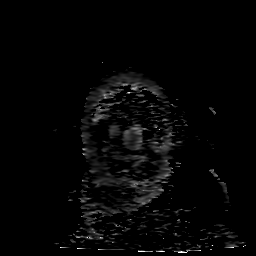
[im 16/32]
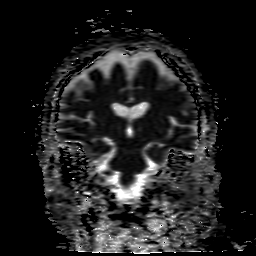
[im 32/32]
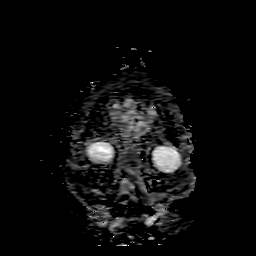

[35 of 48 positions shown; findings below may reference images not displayed]

FINDINGS: The patient was unable to remain motionless for the exam. Small or
subtle lesions could be overlooked.

Brain: No evidence for acute infarction, hemorrhage, mass lesion,
hydrocephalus, or extra-axial fluid.

Mild atrophy. Minor white matter disease, nonspecific. Post
infusion, no abnormal enhancement of the brain or meninges.

Vascular: Normal flow voids.

Skull and upper cervical spine: Partial empty sella. No midline
abnormality.

Sinuses/Orbits: No orbital masses or proptosis. Globes appear
symmetric. Sinuses appear well aerated, without evidence for
air-fluid level.

Other: No nasopharyngeal pathology or mastoid fluid. Scalp and other
visualized extracranial soft tissues grossly unremarkable.
IMPRESSION: Motion degraded examination demonstrating atrophy without acute
intracranial findings.

No abnormal postcontrast enhancement or restricted diffusion to
suggest intracranial infection/inflammatory process.

No significant sinus disease or parameningeal focus of infection.

## 2018-02-04 DIAGNOSIS — M545 Low back pain: Secondary | ICD-10-CM | POA: Diagnosis not present

## 2018-02-04 DIAGNOSIS — M25511 Pain in right shoulder: Secondary | ICD-10-CM | POA: Diagnosis not present

## 2018-02-04 DIAGNOSIS — Z79899 Other long term (current) drug therapy: Secondary | ICD-10-CM | POA: Diagnosis not present

## 2018-02-04 DIAGNOSIS — M542 Cervicalgia: Secondary | ICD-10-CM | POA: Diagnosis not present

## 2018-02-04 DIAGNOSIS — M79602 Pain in left arm: Secondary | ICD-10-CM | POA: Diagnosis not present

## 2018-02-17 ENCOUNTER — Other Ambulatory Visit: Payer: Self-pay | Admitting: Internal Medicine

## 2018-02-28 DIAGNOSIS — H16223 Keratoconjunctivitis sicca, not specified as Sjogren's, bilateral: Secondary | ICD-10-CM | POA: Diagnosis not present

## 2018-02-28 DIAGNOSIS — H11423 Conjunctival edema, bilateral: Secondary | ICD-10-CM | POA: Diagnosis not present

## 2018-02-28 DIAGNOSIS — H04123 Dry eye syndrome of bilateral lacrimal glands: Secondary | ICD-10-CM | POA: Diagnosis not present

## 2018-02-28 DIAGNOSIS — H11023 Central pterygium of eye, bilateral: Secondary | ICD-10-CM | POA: Diagnosis not present

## 2018-03-04 ENCOUNTER — Other Ambulatory Visit: Payer: Self-pay | Admitting: Internal Medicine

## 2018-03-04 DIAGNOSIS — M79602 Pain in left arm: Secondary | ICD-10-CM | POA: Diagnosis not present

## 2018-03-04 DIAGNOSIS — Z79899 Other long term (current) drug therapy: Secondary | ICD-10-CM | POA: Diagnosis not present

## 2018-03-26 ENCOUNTER — Other Ambulatory Visit: Payer: Self-pay | Admitting: Internal Medicine

## 2018-04-01 DIAGNOSIS — Z79899 Other long term (current) drug therapy: Secondary | ICD-10-CM | POA: Diagnosis not present

## 2018-04-01 DIAGNOSIS — M79602 Pain in left arm: Secondary | ICD-10-CM | POA: Diagnosis not present

## 2018-04-01 DIAGNOSIS — M542 Cervicalgia: Secondary | ICD-10-CM | POA: Diagnosis not present

## 2018-04-01 DIAGNOSIS — M545 Low back pain: Secondary | ICD-10-CM | POA: Diagnosis not present

## 2018-04-04 DIAGNOSIS — H26492 Other secondary cataract, left eye: Secondary | ICD-10-CM | POA: Diagnosis not present

## 2018-04-04 DIAGNOSIS — H353131 Nonexudative age-related macular degeneration, bilateral, early dry stage: Secondary | ICD-10-CM | POA: Diagnosis not present

## 2018-04-04 DIAGNOSIS — H11023 Central pterygium of eye, bilateral: Secondary | ICD-10-CM | POA: Diagnosis not present

## 2018-04-04 DIAGNOSIS — T8522XS Displacement of intraocular lens, sequela: Secondary | ICD-10-CM | POA: Diagnosis not present

## 2018-04-04 DIAGNOSIS — H40012 Open angle with borderline findings, low risk, left eye: Secondary | ICD-10-CM | POA: Diagnosis not present

## 2018-04-29 DIAGNOSIS — M542 Cervicalgia: Secondary | ICD-10-CM | POA: Diagnosis not present

## 2018-04-29 DIAGNOSIS — M25511 Pain in right shoulder: Secondary | ICD-10-CM | POA: Diagnosis not present

## 2018-04-29 DIAGNOSIS — M79602 Pain in left arm: Secondary | ICD-10-CM | POA: Diagnosis not present

## 2018-04-29 DIAGNOSIS — Z79899 Other long term (current) drug therapy: Secondary | ICD-10-CM | POA: Diagnosis not present

## 2018-04-29 DIAGNOSIS — M545 Low back pain: Secondary | ICD-10-CM | POA: Diagnosis not present

## 2018-05-30 DIAGNOSIS — M79602 Pain in left arm: Secondary | ICD-10-CM | POA: Diagnosis not present

## 2018-05-30 DIAGNOSIS — M25511 Pain in right shoulder: Secondary | ICD-10-CM | POA: Diagnosis not present

## 2018-05-30 DIAGNOSIS — M542 Cervicalgia: Secondary | ICD-10-CM | POA: Diagnosis not present

## 2018-05-30 DIAGNOSIS — Z79899 Other long term (current) drug therapy: Secondary | ICD-10-CM | POA: Diagnosis not present

## 2018-05-30 DIAGNOSIS — M545 Low back pain: Secondary | ICD-10-CM | POA: Diagnosis not present

## 2018-06-08 ENCOUNTER — Other Ambulatory Visit (INDEPENDENT_AMBULATORY_CARE_PROVIDER_SITE_OTHER): Payer: Medicare Other

## 2018-06-08 DIAGNOSIS — E118 Type 2 diabetes mellitus with unspecified complications: Secondary | ICD-10-CM | POA: Diagnosis not present

## 2018-06-08 LAB — HEPATIC FUNCTION PANEL
ALT: 12 U/L (ref 0–35)
AST: 17 U/L (ref 0–37)
Albumin: 4.7 g/dL (ref 3.5–5.2)
Alkaline Phosphatase: 50 U/L (ref 39–117)
Bilirubin, Direct: 0.2 mg/dL (ref 0.0–0.3)
Total Bilirubin: 1.1 mg/dL (ref 0.2–1.2)
Total Protein: 7.8 g/dL (ref 6.0–8.3)

## 2018-06-08 LAB — BASIC METABOLIC PANEL
BUN: 15 mg/dL (ref 6–23)
CO2: 28 mEq/L (ref 19–32)
Calcium: 9.8 mg/dL (ref 8.4–10.5)
Chloride: 102 mEq/L (ref 96–112)
Creatinine, Ser: 1.16 mg/dL (ref 0.40–1.20)
GFR: 48.21 mL/min — AB (ref >60.00)
Glucose, Bld: 107 mg/dL — ABNORMAL HIGH (ref 70–99)
POTASSIUM: 3.8 meq/L (ref 3.5–5.1)
Sodium: 141 mEq/L (ref 135–145)

## 2018-06-08 LAB — LIPID PANEL
Cholesterol: 155 mg/dL (ref 0–200)
HDL: 72.7 mg/dL (ref >39.00)
LDL Cholesterol: 51 mg/dL (ref 0–99)
NonHDL: 81.84
Total CHOL/HDL Ratio: 2
Triglycerides: 156 mg/dL — ABNORMAL HIGH (ref 0.0–149.0)
VLDL: 31.2 mg/dL (ref 0.0–40.0)

## 2018-06-08 LAB — HEMOGLOBIN A1C: Hgb A1c MFr Bld: 6.4 % (ref 4.6–6.5)

## 2018-06-13 ENCOUNTER — Encounter: Payer: Self-pay | Admitting: Internal Medicine

## 2018-06-13 ENCOUNTER — Ambulatory Visit (INDEPENDENT_AMBULATORY_CARE_PROVIDER_SITE_OTHER): Payer: Medicare Other | Admitting: Internal Medicine

## 2018-06-13 VITALS — BP 124/68 | HR 67 | Temp 97.9°F | Ht <= 58 in | Wt 157.0 lb

## 2018-06-13 DIAGNOSIS — E785 Hyperlipidemia, unspecified: Secondary | ICD-10-CM

## 2018-06-13 DIAGNOSIS — I10 Essential (primary) hypertension: Secondary | ICD-10-CM | POA: Diagnosis not present

## 2018-06-13 DIAGNOSIS — E118 Type 2 diabetes mellitus with unspecified complications: Secondary | ICD-10-CM | POA: Diagnosis not present

## 2018-06-13 DIAGNOSIS — Z23 Encounter for immunization: Secondary | ICD-10-CM

## 2018-06-13 NOTE — Assessment & Plan Note (Signed)
stable overall by history and exam, recent data reviewed with pt, and pt to continue medical treatment as before,  to f/u any worsening symptoms or concerns  

## 2018-06-13 NOTE — Patient Instructions (Addendum)
Please continue all other medications as before, and refills have been done if requested.  Please have the pharmacy call with any other refills you may need.  Please continue your efforts at being more active, low cholesterol diet, and weight control..  Please keep your appointments with your specialists as you may have planned  Please return in 6 months, or sooner if needed, with Lab testing done 3-5 days before  

## 2018-06-13 NOTE — Progress Notes (Signed)
Subjective:    Patient ID: Latasha Wang, adult    DOB: March 23, 1941, 77 y.o.   MRN: [id number]  HPI  Here to f/u; overall doing ok,  Pt denies chest pain, increasing sob or doe, wheezing, orthopnea, PND, increased LE swelling, palpitations, dizziness or syncope.  Pt denies new neurological symptoms such as new headache, or facial or extremity weakness or numbness.  Pt denies polydipsia, polyuria, or low sugar episode.  Pt states overall good compliance with meds, mostly trying to follow appropriate diet, with wt overall stable,  but little exercise however. Due for flu shot.  No new complaints Past Medical History:  Diagnosis Date  . ANXIETY   . DEPRESSION   . Diabetes mellitus   . Essential hypertension 05/09/2007   Qualifier: Diagnosis of  By: Wynona Luna   . FATTY LIVER DISEASE   . Gastroparesis   . GERD   . HYPERLIPIDEMIA   . HYPERTENSION   . LATERAL EPICONDYLITIS, RIGHT    Past Surgical History:  Procedure Laterality Date  . CATARACT EXTRACTION, BILATERAL    . TOTAL ELBOW ARTHROPLASTY  02/19/2012   Procedure: TOTAL ELBOW ARTHROPLASTY;  Surgeon: Rozanna Box, MD;  Location: [facility];  Service: Orthopedics;  Laterality: Left;    reports that he has never smoked. He has never used smokeless tobacco. He reports that he does not drink alcohol or use drugs. family history is not on file. No Known Allergies Current Outpatient Medications on File Prior to Visit  Medication Sig Dispense Refill  . acetaminophen (TYLENOL) 500 MG tablet Take 1-2 tablets (500-1,000 mg total) by mouth every 6 (six) hours as needed for pain or fever. For pain 90 tablet 0  . albuterol (PROVENTIL HFA;VENTOLIN HFA) 108 (90 BASE) MCG/ACT inhaler Inhale 2 puffs into the lungs every 6 (six) hours as needed for wheezing. 1 Inhaler 0  . alendronate (FOSAMAX) 70 MG tablet TAKE 1 TABLET BY MOUTH EVERY 7 DAYS. TAKE WITH A FULL GLASS OF WATER ON AN EMPTY STOMACH 12 tablet 2  . amLODipine (NORVASC) 10 MG tablet TAKE  1 TABLET BY MOUTH EVERY DAY 90 tablet 1  . buPROPion (WELLBUTRIN XL) 150 MG 24 hr tablet TAKE 1 TABLET BY MOUTH EVERY DAY 90 tablet 0  . chlorhexidine (PERIDEX) 0.12 % solution Use as directed 30 mLs in the mouth or throat 3 (three) times daily.    . clonazePAM (KLONOPIN) 0.5 MG tablet TAKE 1 TABLET BY MOUTH TWICE A DAY AS NEEDED 60 tablet 2  . diclofenac sodium (VOLTAREN) 1 % GEL APPLY 4 G TOPICALLY 4 (FOUR) TIMES DAILY AS NEEDED. 300 g 1  . FLUoxetine (PROZAC) 40 MG capsule Take 1 capsule (40 mg total) by mouth daily. 90 capsule 2  . hydrochlorothiazide (MICROZIDE) 12.5 MG capsule TAKE ONE CAPSULE BY MOUTH EVERY DAY 90 capsule 3  . irbesartan (AVAPRO) 300 MG tablet TAKE 1 TABLET (300 MG TOTAL) BY MOUTH AT BEDTIME. 90 tablet 1  . ketoconazole (NIZORAL) 2 % shampoo Apply 1 application topically every Friday.  4  . meclizine (ANTIVERT) 12.5 MG tablet Take 1-2 tablets (12.5-25 mg total) by mouth every 6 (six) hours as needed. 30 tablet 2  . metFORMIN (GLUCOPHAGE-XR) 500 MG 24 hr tablet TAKE 1 TABLET BY MOUTH TWICE A DAY 180 tablet 2  . NARCAN 4 MG/0.1ML LIQD nasal spray kit   0  . OLANZapine (ZYPREXA) 5 MG tablet TAKE 1 TABLET BY MOUTH EVERY DAY 90 tablet 1  . pioglitazone (  ACTOS) 45 MG tablet TAKE 1 TABLET (45 MG TOTAL) BY MOUTH DAILY. 90 tablet 2  . rosuvastatin (CRESTOR) 10 MG tablet TAKE 1 TABLET BY MOUTH EVERY DAY IN THE EVENING 90 tablet 1  . traMADol (ULTRAM) 50 MG tablet Take 1 tablet (50 mg total) by mouth every 6 (six) hours as needed. 30 tablet 0   No current facility-administered medications on file prior to visit.    Review of Systems  Constitutional: Negative for other unusual diaphoresis or sweats HENT: Negative for ear discharge or swelling Eyes: Negative for other worsening visual disturbances Respiratory: Negative for stridor or other swelling  Gastrointestinal: Negative for worsening distension or other blood Genitourinary: Negative for retention or other urinary  change Musculoskeletal: Negative for other MSK pain or swelling Skin: Negative for color change or other new lesions Neurological: Negative for worsening tremors and other numbness  Psychiatric/Behavioral: Negative for worsening agitation or other fatigue All other system neg per pt    Objective:   Physical Exam BP 124/68   Pulse 67   Temp 97.9 F (36.6 C) (Oral)   Ht '4\' 10"'  (1.473 m)   Wt 157 lb (71.2 kg)   BMI 32.81 kg/m  VS noted,  Constitutional: Pt appears in NAD HENT: Head: NCAT.  Right Ear: External ear normal.  Left Ear: External ear normal.  Eyes: . Pupils are equal, round, and reactive to light. Conjunctivae and EOM are normal Nose: without d/c or deformity Neck: Neck supple. Gross normal ROM Cardiovascular: Normal rate and regular rhythm.   Pulmonary/Chest: Effort normal and breath sounds without rales or wheezing.  Abd:  Soft, NT, ND, + BS, no organomegaly Neurological: Pt is alert. At baseline orientation, motor grossly intact Skin: Skin is warm. No rashes, other new lesions, no LE edema Psychiatric: Pt behavior is normal without agitation  No other exam findings Lab Results  Component Value Date   WBC 5.0 12/06/2017   HGB 11.7 (L) 12/06/2017   HCT 35.7 (L) 12/06/2017   PLT 219.0 12/06/2017   GLUCOSE 107 (H) 06/08/2018   CHOL 155 06/08/2018   TRIG 156.0 (H) 06/08/2018   HDL 72.70 06/08/2018   LDLDIRECT 85.1 08/19/2006   LDLCALC 51 06/08/2018   ALT 12 06/08/2018   AST 17 06/08/2018   NA 141 06/08/2018   K 3.8 06/08/2018   CL 102 06/08/2018   CREATININE 1.16 06/08/2018   BUN 15 06/08/2018   CO2 28 06/08/2018   TSH 1.77 12/06/2017   INR 0.91 02/19/2012   HGBA1C 6.4 06/08/2018   MICROALBUR 1.3 12/06/2017       Assessment & Plan:

## 2018-06-26 ENCOUNTER — Other Ambulatory Visit: Payer: Self-pay | Admitting: Internal Medicine

## 2018-06-30 DIAGNOSIS — M79602 Pain in left arm: Secondary | ICD-10-CM | POA: Diagnosis not present

## 2018-06-30 DIAGNOSIS — M25511 Pain in right shoulder: Secondary | ICD-10-CM | POA: Diagnosis not present

## 2018-06-30 DIAGNOSIS — M545 Low back pain: Secondary | ICD-10-CM | POA: Diagnosis not present

## 2018-06-30 DIAGNOSIS — Z79899 Other long term (current) drug therapy: Secondary | ICD-10-CM | POA: Diagnosis not present

## 2018-06-30 DIAGNOSIS — M542 Cervicalgia: Secondary | ICD-10-CM | POA: Diagnosis not present

## 2018-06-30 DIAGNOSIS — R05 Cough: Secondary | ICD-10-CM | POA: Diagnosis not present

## 2018-06-30 DIAGNOSIS — J208 Acute bronchitis due to other specified organisms: Secondary | ICD-10-CM | POA: Diagnosis not present

## 2018-06-30 DIAGNOSIS — E119 Type 2 diabetes mellitus without complications: Secondary | ICD-10-CM | POA: Diagnosis not present

## 2018-07-09 ENCOUNTER — Other Ambulatory Visit: Payer: Self-pay | Admitting: Internal Medicine

## 2018-07-11 NOTE — Telephone Encounter (Signed)
Done erx 

## 2018-07-11 NOTE — Telephone Encounter (Signed)
   LOV:06/13/18 NextOV:12/19/18  Last Filled/Quantity:06/06/18 60#

## 2018-08-01 DIAGNOSIS — M542 Cervicalgia: Secondary | ICD-10-CM | POA: Diagnosis not present

## 2018-08-01 DIAGNOSIS — M79602 Pain in left arm: Secondary | ICD-10-CM | POA: Diagnosis not present

## 2018-08-01 DIAGNOSIS — M545 Low back pain: Secondary | ICD-10-CM | POA: Diagnosis not present

## 2018-08-01 DIAGNOSIS — Z79899 Other long term (current) drug therapy: Secondary | ICD-10-CM | POA: Diagnosis not present

## 2018-08-01 DIAGNOSIS — M25511 Pain in right shoulder: Secondary | ICD-10-CM | POA: Diagnosis not present

## 2018-08-22 ENCOUNTER — Other Ambulatory Visit: Payer: Self-pay | Admitting: Internal Medicine

## 2018-08-29 DIAGNOSIS — M545 Low back pain: Secondary | ICD-10-CM | POA: Diagnosis not present

## 2018-08-29 DIAGNOSIS — Z79899 Other long term (current) drug therapy: Secondary | ICD-10-CM | POA: Diagnosis not present

## 2018-08-29 DIAGNOSIS — M79602 Pain in left arm: Secondary | ICD-10-CM | POA: Diagnosis not present

## 2018-08-31 ENCOUNTER — Other Ambulatory Visit: Payer: Self-pay | Admitting: Internal Medicine

## 2018-09-03 ENCOUNTER — Other Ambulatory Visit: Payer: Self-pay | Admitting: Internal Medicine

## 2018-09-29 DIAGNOSIS — Z9189 Other specified personal risk factors, not elsewhere classified: Secondary | ICD-10-CM | POA: Diagnosis not present

## 2018-09-29 DIAGNOSIS — Z79899 Other long term (current) drug therapy: Secondary | ICD-10-CM | POA: Diagnosis not present

## 2018-09-29 DIAGNOSIS — M79602 Pain in left arm: Secondary | ICD-10-CM | POA: Diagnosis not present

## 2018-09-29 DIAGNOSIS — M545 Low back pain: Secondary | ICD-10-CM | POA: Diagnosis not present

## 2018-10-28 DIAGNOSIS — Z9189 Other specified personal risk factors, not elsewhere classified: Secondary | ICD-10-CM | POA: Diagnosis not present

## 2018-10-28 DIAGNOSIS — M79602 Pain in left arm: Secondary | ICD-10-CM | POA: Diagnosis not present

## 2018-11-24 ENCOUNTER — Other Ambulatory Visit: Payer: Self-pay | Admitting: Internal Medicine

## 2018-11-28 DIAGNOSIS — M79602 Pain in left arm: Secondary | ICD-10-CM | POA: Diagnosis not present

## 2018-11-28 DIAGNOSIS — Z9189 Other specified personal risk factors, not elsewhere classified: Secondary | ICD-10-CM | POA: Diagnosis not present

## 2018-11-30 ENCOUNTER — Other Ambulatory Visit: Payer: Self-pay | Admitting: Internal Medicine

## 2018-12-15 ENCOUNTER — Other Ambulatory Visit (INDEPENDENT_AMBULATORY_CARE_PROVIDER_SITE_OTHER): Payer: Medicare Other

## 2018-12-15 DIAGNOSIS — E118 Type 2 diabetes mellitus with unspecified complications: Secondary | ICD-10-CM | POA: Diagnosis not present

## 2018-12-15 LAB — HEPATIC FUNCTION PANEL
ALT: 16 U/L (ref 0–35)
AST: 23 U/L (ref 0–37)
Albumin: 4.3 g/dL (ref 3.5–5.2)
Alkaline Phosphatase: 46 U/L (ref 39–117)
Bilirubin, Direct: 0.2 mg/dL (ref 0.0–0.3)
Total Bilirubin: 1.3 mg/dL — ABNORMAL HIGH (ref 0.2–1.2)
Total Protein: 6.9 g/dL (ref 6.0–8.3)

## 2018-12-15 LAB — BASIC METABOLIC PANEL
BUN: 14 mg/dL (ref 6–23)
CO2: 25 mEq/L (ref 19–32)
Calcium: 8.8 mg/dL (ref 8.4–10.5)
Chloride: 106 mEq/L (ref 96–112)
Creatinine, Ser: 0.77 mg/dL (ref 0.40–1.20)
GFR: 72.68 mL/min (ref >60.00)
Glucose, Bld: 103 mg/dL — ABNORMAL HIGH (ref 70–99)
Potassium: 3.6 mEq/L (ref 3.5–5.1)
Sodium: 141 mEq/L (ref 135–145)

## 2018-12-15 LAB — CBC WITH DIFFERENTIAL/PLATELET
Basophils Absolute: 0 10*3/uL (ref 0.0–0.1)
Basophils Relative: 0.9 % (ref 0.0–3.0)
Eosinophils Absolute: 0.1 10*3/uL (ref 0.0–0.7)
Eosinophils Relative: 1.7 % (ref 0.0–5.0)
HCT: 35.6 % — ABNORMAL LOW (ref 36.0–46.0)
Hemoglobin: 11.7 g/dL — ABNORMAL LOW (ref 12.0–15.0)
Lymphocytes Relative: 42 % (ref 12.0–46.0)
Lymphs Abs: 2.2 10*3/uL (ref 0.7–4.0)
MCHC: 32.9 g/dL (ref 30.0–36.0)
MCV: 90.4 fl (ref 78.0–100.0)
Monocytes Absolute: 0.4 10*3/uL (ref 0.1–1.0)
Monocytes Relative: 7 % (ref 3.0–12.0)
Neutro Abs: 2.6 10*3/uL (ref 1.4–7.7)
Neutrophils Relative %: 48.4 % (ref 43.0–77.0)
Platelets: 203 10*3/uL (ref 150.0–400.0)
RBC: 3.94 Mil/uL (ref 3.87–5.11)
RDW: 15.8 % — ABNORMAL HIGH (ref 11.5–15.5)
WBC: 5.3 10*3/uL (ref 4.0–10.5)

## 2018-12-15 LAB — MICROALBUMIN / CREATININE URINE RATIO
Creatinine,U: 136.2 mg/dL
Microalb Creat Ratio: 3.1 mg/g (ref 0.0–30.0)
Microalb, Ur: 4.2 mg/dL — ABNORMAL HIGH (ref 0.0–1.9)

## 2018-12-15 LAB — HEMOGLOBIN A1C: Hgb A1c MFr Bld: 6.2 % (ref 4.6–6.5)

## 2018-12-15 LAB — URINALYSIS, ROUTINE W REFLEX MICROSCOPIC
Bilirubin Urine: NEGATIVE
Hgb urine dipstick: NEGATIVE
Ketones, ur: NEGATIVE
Leukocytes,Ua: NEGATIVE
Nitrite: NEGATIVE
RBC / HPF: NONE SEEN (ref 0–?)
Specific Gravity, Urine: 1.02 (ref 1.000–1.030)
Total Protein, Urine: NEGATIVE
Urine Glucose: NEGATIVE
Urobilinogen, UA: 0.2 (ref 0.0–1.0)
WBC, UA: NONE SEEN (ref 0–?)
pH: 6 (ref 5.0–8.0)

## 2018-12-15 LAB — LIPID PANEL
Cholesterol: 148 mg/dL (ref 0–200)
HDL: 54.9 mg/dL (ref >39.00)
LDL Cholesterol: 54 mg/dL (ref 0–99)
NonHDL: 92.66
Total CHOL/HDL Ratio: 3
Triglycerides: 191 mg/dL — ABNORMAL HIGH (ref 0.0–149.0)
VLDL: 38.2 mg/dL (ref 0.0–40.0)

## 2018-12-15 LAB — TSH: TSH: 1.28 u[IU]/mL (ref 0.35–4.50)

## 2018-12-19 ENCOUNTER — Other Ambulatory Visit: Payer: Self-pay

## 2018-12-19 ENCOUNTER — Encounter: Payer: Self-pay | Admitting: Internal Medicine

## 2018-12-19 ENCOUNTER — Ambulatory Visit (INDEPENDENT_AMBULATORY_CARE_PROVIDER_SITE_OTHER): Payer: Medicare Other | Admitting: Internal Medicine

## 2018-12-19 VITALS — BP 124/86 | HR 83 | Temp 98.8°F | Ht <= 58 in | Wt 154.0 lb

## 2018-12-19 DIAGNOSIS — Z0000 Encounter for general adult medical examination without abnormal findings: Secondary | ICD-10-CM

## 2018-12-19 DIAGNOSIS — Z23 Encounter for immunization: Secondary | ICD-10-CM

## 2018-12-19 DIAGNOSIS — E118 Type 2 diabetes mellitus with unspecified complications: Secondary | ICD-10-CM

## 2018-12-19 DIAGNOSIS — E611 Iron deficiency: Secondary | ICD-10-CM | POA: Diagnosis not present

## 2018-12-19 DIAGNOSIS — E559 Vitamin D deficiency, unspecified: Secondary | ICD-10-CM

## 2018-12-19 DIAGNOSIS — E538 Deficiency of other specified B group vitamins: Secondary | ICD-10-CM

## 2018-12-19 NOTE — Progress Notes (Signed)
Subjective:    Patient ID: Latasha Wang, adult    DOB: Dec 02, 1940, 78 y.o.   MRN: [id number]  HPI  Here for wellness and f/u;  Overall doing ok;  Pt denies Chest pain, worsening SOB, DOE, wheezing, orthopnea, PND, worsening LE edema, palpitations, dizziness or syncope.  Pt denies neurological change such as new headache, facial or extremity weakness.  Pt denies polydipsia, polyuria, or low sugar symptoms. Pt states overall good compliance with treatment and medications, good tolerability, and has been trying to follow appropriate diet.  Pt denies worsening depressive symptoms, suicidal ideation or panic. No fever, night sweats, wt loss, loss of appetite, or other constitutional symptoms.  Pt states good ability with ADL's, has low fall risk, home safety reviewed and adequate, no other significant changes in hearing or vision, and only occasionally active with exercise.  Lost some wt due to loss of taste.  Appetite still good.  Wt Readings from Last 3 Encounters:  12/19/18 154 lb (69.9 kg)  06/13/18 157 lb (71.2 kg)  12/10/17 166 lb 8 oz (75.5 kg)   Past Medical History:  Diagnosis Date  . ANXIETY   . DEPRESSION   . Diabetes mellitus   . Essential hypertension 05/09/2007   Qualifier: Diagnosis of  By: Wynona Luna   . FATTY LIVER DISEASE   . Gastroparesis   . GERD   . HYPERLIPIDEMIA   . HYPERTENSION   . LATERAL EPICONDYLITIS, RIGHT    Past Surgical History:  Procedure Laterality Date  . CATARACT EXTRACTION, BILATERAL    . TOTAL ELBOW ARTHROPLASTY  02/19/2012   Procedure: TOTAL ELBOW ARTHROPLASTY;  Surgeon: Rozanna Box, MD;  Location: [facility];  Service: Orthopedics;  Laterality: Left;    reports that she has never smoked. She has never used smokeless tobacco. She reports that she does not drink alcohol or use drugs. family history is not on file. No Known Allergies Current Outpatient Medications on File Prior to Visit  Medication Sig Dispense Refill  . acetaminophen  (TYLENOL) 500 MG tablet Take 1-2 tablets (500-1,000 mg total) by mouth every 6 (six) hours as needed for pain or fever. For pain 90 tablet 0  . albuterol (PROVENTIL HFA;VENTOLIN HFA) 108 (90 BASE) MCG/ACT inhaler Inhale 2 puffs into the lungs every 6 (six) hours as needed for wheezing. 1 Inhaler 0  . alendronate (FOSAMAX) 70 MG tablet TAKE 1 TABLET BY MOUTH EVERY 7 DAYS. TAKE WITH A FULL GLASS OF WATER ON AN EMPTY STOMACH 12 tablet 0  . amLODipine (NORVASC) 10 MG tablet TAKE 1 TABLET BY MOUTH EVERY DAY 90 tablet 1  . buPROPion (WELLBUTRIN XL) 150 MG 24 hr tablet TAKE 1 TABLET BY MOUTH EVERY DAY 90 tablet 0  . chlorhexidine (PERIDEX) 0.12 % solution Use as directed 30 mLs in the mouth or throat 3 (three) times daily.    . clonazePAM (KLONOPIN) 0.5 MG tablet TAKE 1 TABLET BY MOUTH TWICE A DAY AS NEEDED 60 tablet 2  . diclofenac sodium (VOLTAREN) 1 % GEL APPLY 4 G TOPICALLY 4 (FOUR) TIMES DAILY AS NEEDED. 300 g 1  . FLUoxetine (PROZAC) 40 MG capsule TAKE 1 CAPSULE BY MOUTH EVERY DAY 90 capsule 1  . hydrochlorothiazide (MICROZIDE) 12.5 MG capsule TAKE ONE CAPSULE BY MOUTH EVERY DAY 90 capsule 3  . irbesartan (AVAPRO) 300 MG tablet TAKE 1 TABLET (300 MG TOTAL) BY MOUTH AT BEDTIME. 90 tablet 1  . ketoconazole (NIZORAL) 2 % shampoo Apply 1 application topically every  Friday.  4  . meclizine (ANTIVERT) 12.5 MG tablet Take 1-2 tablets (12.5-25 mg total) by mouth every 6 (six) hours as needed. 30 tablet 2  . metFORMIN (GLUCOPHAGE-XR) 500 MG 24 hr tablet TAKE 1 TABLET BY MOUTH TWICE A DAY 180 tablet 0  . NARCAN 4 MG/0.1ML LIQD nasal spray kit   0  . OLANZapine (ZYPREXA) 5 MG tablet TAKE 1 TABLET BY MOUTH EVERY DAY 90 tablet 1  . pioglitazone (ACTOS) 45 MG tablet TAKE 1 TABLET (45 MG TOTAL) BY MOUTH DAILY. 90 tablet 2  . rosuvastatin (CRESTOR) 10 MG tablet TAKE 1 TABLET BY MOUTH EVERY DAY IN THE EVENING 90 tablet 1  . traMADol (ULTRAM) 50 MG tablet Take 1 tablet (50 mg total) by mouth every 6 (six) hours as  needed. 30 tablet 0   No current facility-administered medications on file prior to visit.    Review of Systems Constitutional: Negative for other unusual diaphoresis, sweats, appetite or weight changes HENT: Negative for other worsening hearing loss, ear pain, facial swelling, mouth sores or neck stiffness.   Eyes: Negative for other worsening pain, redness or other visual disturbance.  Respiratory: Negative for other stridor or swelling Cardiovascular: Negative for other palpitations or other chest pain  Gastrointestinal: Negative for worsening diarrhea or loose stools, blood in stool, distention or other pain Genitourinary: Negative for hematuria, flank pain or other change in urine volume.  Musculoskeletal: Negative for myalgias or other joint swelling.  Skin: Negative for other color change, or other wound or worsening drainage.  Neurological: Negative for other syncope or numbness. Hematological: Negative for other adenopathy or swelling Psychiatric/Behavioral: Negative for hallucinations, other worsening agitation, SI, self-injury, or new decreased concentration All other system neg per pt    Objective:   Physical Exam BP 124/86   Pulse 83   Temp 98.8 F (37.1 C) (Oral)   Ht '4\' 10"'  (1.473 m)   Wt 154 lb (69.9 kg)   SpO2 96%   BMI 32.19 kg/m  VS noted,  Constitutional: Pt is oriented to person, place, and time. Appears well-developed and well-nourished, in no significant distress and comfortable Head: Normocephalic and atraumatic  Eyes: Conjunctivae and EOM are normal. Pupils are equal, round, and reactive to light Right Ear: External ear normal without discharge Left Ear: External ear normal without discharge Nose: Nose without discharge or deformity Mouth/Throat: Oropharynx is without other ulcerations and moist  Neck: Normal range of motion. Neck supple. No JVD present. No tracheal deviation present or significant neck LA or mass Cardiovascular: Normal rate, regular  rhythm, normal heart sounds and intact distal pulses.   Pulmonary/Chest: WOB normal and breath sounds without rales or wheezing  Abdominal: Soft. Bowel sounds are normal. NT. No HSM  Musculoskeletal: Normal range of motion. Exhibits no edema Lymphadenopathy: Has no other cervical adenopathy.  Neurological: Pt is alert and oriented to person, place, and time. Pt has normal reflexes. No cranial nerve deficit. Motor grossly intact, Gait intact Skin: Skin is warm and dry. No rash noted or new ulcerations Psychiatric:  Has normal mood and affect. Behavior is normal without agitation No other exam findings Lab Results  Component Value Date   WBC 5.3 12/15/2018   HGB 11.7 (L) 12/15/2018   HCT 35.6 (L) 12/15/2018   PLT 203.0 12/15/2018   GLUCOSE 103 (H) 12/15/2018   CHOL 148 12/15/2018   TRIG 191.0 (H) 12/15/2018   HDL 54.90 12/15/2018   LDLDIRECT 85.1 08/19/2006   LDLCALC 54 12/15/2018   ALT  16 12/15/2018   AST 23 12/15/2018   NA 141 12/15/2018   K 3.6 12/15/2018   CL 106 12/15/2018   CREATININE 0.77 12/15/2018   BUN 14 12/15/2018   CO2 25 12/15/2018   TSH 1.28 12/15/2018   INR 0.91 02/19/2012   HGBA1C 6.2 12/15/2018   MICROALBUR 4.2 (H) 12/15/2018      Assessment & Plan:

## 2018-12-19 NOTE — Patient Instructions (Addendum)
You had the Tdap tetanus shot today  Please continue all other medications as before, and refills have been done if requested.  Please have the pharmacy call with any other refills you may need.  Please continue your efforts at being more active, low cholesterol diet, and weight control.  You are otherwise up to date with prevention measures today.  Please keep your appointments with your specialists as you may have planned  Please return in 6 months, or sooner if needed, with Lab testing done 3-5 days before  

## 2018-12-20 ENCOUNTER — Encounter: Payer: Self-pay | Admitting: Internal Medicine

## 2018-12-20 NOTE — Assessment & Plan Note (Signed)

## 2018-12-20 NOTE — Assessment & Plan Note (Signed)
stable overall by history and exam, recent data reviewed with pt, and pt to continue medical treatment as before,  to f/u any worsening symptoms or concerns  

## 2018-12-30 ENCOUNTER — Other Ambulatory Visit: Payer: Self-pay | Admitting: Internal Medicine

## 2018-12-30 DIAGNOSIS — E559 Vitamin D deficiency, unspecified: Secondary | ICD-10-CM | POA: Diagnosis not present

## 2018-12-30 DIAGNOSIS — M79602 Pain in left arm: Secondary | ICD-10-CM | POA: Diagnosis not present

## 2018-12-30 DIAGNOSIS — Z1159 Encounter for screening for other viral diseases: Secondary | ICD-10-CM | POA: Diagnosis not present

## 2018-12-30 DIAGNOSIS — I10 Essential (primary) hypertension: Secondary | ICD-10-CM | POA: Diagnosis not present

## 2018-12-30 DIAGNOSIS — Z79899 Other long term (current) drug therapy: Secondary | ICD-10-CM | POA: Diagnosis not present

## 2018-12-30 DIAGNOSIS — M129 Arthropathy, unspecified: Secondary | ICD-10-CM | POA: Diagnosis not present

## 2019-01-05 ENCOUNTER — Telehealth: Payer: Self-pay | Admitting: Internal Medicine

## 2019-01-05 NOTE — Telephone Encounter (Signed)
Called patient to schedule AWV; no answer, could not leave message. SF

## 2019-01-19 ENCOUNTER — Other Ambulatory Visit: Payer: Self-pay | Admitting: Internal Medicine

## 2019-01-19 NOTE — Telephone Encounter (Signed)
Done erx 

## 2019-01-29 ENCOUNTER — Other Ambulatory Visit: Payer: Self-pay | Admitting: Internal Medicine

## 2019-01-30 DIAGNOSIS — Z79899 Other long term (current) drug therapy: Secondary | ICD-10-CM | POA: Diagnosis not present

## 2019-01-30 DIAGNOSIS — M79602 Pain in left arm: Secondary | ICD-10-CM | POA: Diagnosis not present

## 2019-03-02 DIAGNOSIS — Z79899 Other long term (current) drug therapy: Secondary | ICD-10-CM | POA: Diagnosis not present

## 2019-03-02 DIAGNOSIS — M79602 Pain in left arm: Secondary | ICD-10-CM | POA: Diagnosis not present

## 2019-03-06 ENCOUNTER — Other Ambulatory Visit: Payer: Self-pay | Admitting: Internal Medicine

## 2019-03-09 ENCOUNTER — Other Ambulatory Visit: Payer: Self-pay | Admitting: Internal Medicine

## 2019-03-31 DIAGNOSIS — Z79899 Other long term (current) drug therapy: Secondary | ICD-10-CM | POA: Diagnosis not present

## 2019-03-31 DIAGNOSIS — Z7189 Other specified counseling: Secondary | ICD-10-CM | POA: Diagnosis not present

## 2019-03-31 DIAGNOSIS — M79602 Pain in left arm: Secondary | ICD-10-CM | POA: Diagnosis not present

## 2019-04-27 ENCOUNTER — Other Ambulatory Visit: Payer: Self-pay | Admitting: Internal Medicine

## 2019-05-01 DIAGNOSIS — Z23 Encounter for immunization: Secondary | ICD-10-CM | POA: Diagnosis not present

## 2019-05-01 DIAGNOSIS — Z7189 Other specified counseling: Secondary | ICD-10-CM | POA: Diagnosis not present

## 2019-05-01 DIAGNOSIS — M79602 Pain in left arm: Secondary | ICD-10-CM | POA: Diagnosis not present

## 2019-05-01 DIAGNOSIS — Z79899 Other long term (current) drug therapy: Secondary | ICD-10-CM | POA: Diagnosis not present

## 2019-05-31 DIAGNOSIS — M79602 Pain in left arm: Secondary | ICD-10-CM | POA: Diagnosis not present

## 2019-05-31 DIAGNOSIS — Z79899 Other long term (current) drug therapy: Secondary | ICD-10-CM | POA: Diagnosis not present

## 2019-06-14 ENCOUNTER — Other Ambulatory Visit (INDEPENDENT_AMBULATORY_CARE_PROVIDER_SITE_OTHER): Payer: Medicare Other

## 2019-06-14 DIAGNOSIS — E538 Deficiency of other specified B group vitamins: Secondary | ICD-10-CM

## 2019-06-14 DIAGNOSIS — E118 Type 2 diabetes mellitus with unspecified complications: Secondary | ICD-10-CM

## 2019-06-14 DIAGNOSIS — E611 Iron deficiency: Secondary | ICD-10-CM

## 2019-06-14 DIAGNOSIS — E559 Vitamin D deficiency, unspecified: Secondary | ICD-10-CM

## 2019-06-14 LAB — BASIC METABOLIC PANEL
BUN: 11 mg/dL (ref 6–23)
CO2: 26 mEq/L (ref 19–32)
Calcium: 9.2 mg/dL (ref 8.4–10.5)
Chloride: 107 mEq/L (ref 96–112)
Creatinine, Ser: 0.82 mg/dL (ref 0.40–1.20)
GFR: 67.5 mL/min (ref >60.00)
Glucose, Bld: 102 mg/dL — ABNORMAL HIGH (ref 70–99)
Potassium: 3.8 mEq/L (ref 3.5–5.1)
Sodium: 143 mEq/L (ref 135–145)

## 2019-06-14 LAB — IBC PANEL
Iron: 98 ug/dL (ref 42–145)
Saturation Ratios: 25.1 % (ref 20.0–50.0)
Transferrin: 279 mg/dL (ref 212.0–360.0)

## 2019-06-14 LAB — HEPATIC FUNCTION PANEL
ALT: 17 U/L (ref 0–35)
AST: 22 U/L (ref 0–37)
Albumin: 4.4 g/dL (ref 3.5–5.2)
Alkaline Phosphatase: 50 U/L (ref 39–117)
Bilirubin, Direct: 0.3 mg/dL (ref 0.0–0.3)
Total Bilirubin: 1.6 mg/dL — ABNORMAL HIGH (ref 0.2–1.2)
Total Protein: 7.2 g/dL (ref 6.0–8.3)

## 2019-06-14 LAB — LIPID PANEL
Cholesterol: 159 mg/dL (ref 0–200)
HDL: 68.8 mg/dL (ref >39.00)
LDL Cholesterol: 61 mg/dL (ref 0–99)
NonHDL: 89.88
Total CHOL/HDL Ratio: 2
Triglycerides: 143 mg/dL (ref 0.0–149.0)
VLDL: 28.6 mg/dL (ref 0.0–40.0)

## 2019-06-14 LAB — HEMOGLOBIN A1C: Hgb A1c MFr Bld: 6.2 % (ref 4.6–6.5)

## 2019-06-14 LAB — VITAMIN B12: Vitamin B-12: 242 pg/mL (ref 211–911)

## 2019-06-14 LAB — VITAMIN D 25 HYDROXY (VIT D DEFICIENCY, FRACTURES): VITD: 36.64 ng/mL (ref 30.00–100.00)

## 2019-06-19 ENCOUNTER — Ambulatory Visit (INDEPENDENT_AMBULATORY_CARE_PROVIDER_SITE_OTHER): Payer: Medicare Other | Admitting: Internal Medicine

## 2019-06-19 ENCOUNTER — Encounter: Payer: Self-pay | Admitting: Internal Medicine

## 2019-06-19 ENCOUNTER — Other Ambulatory Visit: Payer: Self-pay

## 2019-06-19 ENCOUNTER — Other Ambulatory Visit (INDEPENDENT_AMBULATORY_CARE_PROVIDER_SITE_OTHER): Payer: Medicare Other

## 2019-06-19 VITALS — BP 124/76 | HR 64 | Temp 98.4°F | Resp 12 | Ht 59.0 in | Wt 163.0 lb

## 2019-06-19 DIAGNOSIS — Z9289 Personal history of other medical treatment: Secondary | ICD-10-CM

## 2019-06-19 DIAGNOSIS — E118 Type 2 diabetes mellitus with unspecified complications: Secondary | ICD-10-CM | POA: Diagnosis not present

## 2019-06-19 DIAGNOSIS — F32A Depression, unspecified: Secondary | ICD-10-CM

## 2019-06-19 DIAGNOSIS — I10 Essential (primary) hypertension: Secondary | ICD-10-CM

## 2019-06-19 DIAGNOSIS — I517 Cardiomegaly: Secondary | ICD-10-CM | POA: Diagnosis not present

## 2019-06-19 DIAGNOSIS — F329 Major depressive disorder, single episode, unspecified: Secondary | ICD-10-CM

## 2019-06-19 DIAGNOSIS — E785 Hyperlipidemia, unspecified: Secondary | ICD-10-CM | POA: Diagnosis not present

## 2019-06-19 DIAGNOSIS — Z0000 Encounter for general adult medical examination without abnormal findings: Secondary | ICD-10-CM

## 2019-06-19 MED ORDER — BUPROPION HCL ER (XL) 300 MG PO TB24: 300.0000 mg | ORAL_TABLET | Freq: Every day | ORAL | 3 refills | Status: DC

## 2019-06-19 NOTE — Progress Notes (Signed)
Subjective:    Patient ID: Latasha Wang, female    DOB: 06/15/1941, 78 y.o.   MRN: [id number]  HPI  Here to f/u; overall doing ok,  Pt denies chest pain, increasing sob or doe, wheezing, orthopnea, PND, increased LE swelling, palpitations, dizziness or syncope.  Pt denies new neurological symptoms such as new headache, or facial or extremity weakness or numbness.  Pt denies polydipsia, polyuria, or low sugar episode.  Pt states overall good compliance with meds, mostly trying to follow appropriate diet, with wt overall stable,  but little exercise however. C/o mild increased depression symptoms but no SI or HI despite good med compliance.  Needs CXR for medicaid purpose due to hx of pos ppd, born in [place].   Past Medical History:  Diagnosis Date  . ANXIETY   . DEPRESSION   . Diabetes mellitus   . Essential hypertension 05/09/2007   Qualifier: Diagnosis of  By: Wynona Luna   . FATTY LIVER DISEASE   . Gastroparesis   . GERD   . HYPERLIPIDEMIA   . HYPERTENSION   . LATERAL EPICONDYLITIS, RIGHT    Past Surgical History:  Procedure Laterality Date  . CATARACT EXTRACTION, BILATERAL    . TOTAL ELBOW ARTHROPLASTY  02/19/2012   Procedure: TOTAL ELBOW ARTHROPLASTY;  Surgeon: Rozanna Box, MD;  Location: [facility];  Service: Orthopedics;  Laterality: Left;    reports that she has never smoked. She has never used smokeless tobacco. She reports that she does not drink alcohol or use drugs. family history is not on file. No Known Allergies Current Outpatient Medications on File Prior to Visit  Medication Sig Dispense Refill  . acetaminophen (TYLENOL) 500 MG tablet Take 1-2 tablets (500-1,000 mg total) by mouth every 6 (six) hours as needed for pain or fever. For pain 90 tablet 0  . albuterol (PROVENTIL HFA;VENTOLIN HFA) 108 (90 BASE) MCG/ACT inhaler Inhale 2 puffs into the lungs every 6 (six) hours as needed for wheezing. 1 Inhaler 0  . amLODipine (NORVASC) 10 MG tablet TAKE 1 TABLET BY MOUTH  EVERY DAY 90 tablet 1  . chlorhexidine (PERIDEX) 0.12 % solution Use as directed 30 mLs in the mouth or throat 3 (three) times daily.    . clonazePAM (KLONOPIN) 0.5 MG tablet TAKE 1 TABLET BY MOUTH TWICE A DAY AS NEEDED 60 tablet 5  . FLUoxetine (PROZAC) 40 MG capsule TAKE 1 CAPSULE BY MOUTH EVERY DAY 90 capsule 1  . hydrochlorothiazide (MICROZIDE) 12.5 MG capsule TAKE ONE CAPSULE BY MOUTH EVERY DAY 90 capsule 3  . irbesartan (AVAPRO) 300 MG tablet TAKE 1 TABLET (300 MG TOTAL) BY MOUTH AT BEDTIME. 90 tablet 1  . ketoconazole (NIZORAL) 2 % shampoo Apply 1 application topically every Friday.  4  . meclizine (ANTIVERT) 12.5 MG tablet TAKE 1-2 TABLETS (12.5-25 MG TOTAL) BY MOUTH EVERY 6 (SIX) HOURS AS NEEDED. 30 tablet 2  . metFORMIN (GLUCOPHAGE-XR) 500 MG 24 hr tablet TAKE 1 TABLET BY MOUTH TWICE A DAY 180 tablet 1  . NARCAN 4 MG/0.1ML LIQD nasal spray kit   0  . OLANZapine (ZYPREXA) 5 MG tablet TAKE 1 TABLET BY MOUTH EVERY DAY 90 tablet 1  . pioglitazone (ACTOS) 45 MG tablet TAKE 1 TABLET (45 MG TOTAL) BY MOUTH DAILY. 90 tablet 2  . rosuvastatin (CRESTOR) 10 MG tablet TAKE 1 TABLET BY MOUTH EVERY DAY IN THE EVENING 90 tablet 1  . alendronate (FOSAMAX) 70 MG tablet TAKE 1 TABLET BY MOUTH EVERY  7 DAYS. TAKE WITH A FULL GLASS OF WATER ON AN EMPTY STOMACH (Patient not taking: Reported on 06/19/2019) 12 tablet 0  . traMADol (ULTRAM) 50 MG tablet Take 1 tablet (50 mg total) by mouth every 6 (six) hours as needed. (Patient not taking: Reported on 06/19/2019) 30 tablet 0   No current facility-administered medications on file prior to visit.   Review of Systems  Constitutional: Negative for other unusual diaphoresis or sweats HENT: Negative for ear discharge or swelling Eyes: Negative for other worsening visual disturbances Respiratory: Negative for stridor or other swelling  Gastrointestinal: Negative for worsening distension or other blood Genitourinary: Negative for retention or other urinary  change Musculoskeletal: Negative for other MSK pain or swelling Skin: Negative for color change or other new lesions Neurological: Negative for worsening tremors and other numbness  Psychiatric/Behavioral: Negative for worsening agitation or other fatigue All otherwise neg per pt     Objective:   Physical Exam BP 124/76   Pulse 64   Temp 98.4 F (36.9 C)   Resp 12   Ht '4\' 11"'  (1.499 m)   Wt 163 lb (73.9 kg)   SpO2 97%   BMI 32.92 kg/m  VS noted,  Constitutional: Pt appears in NAD HENT: Head: NCAT.  Right Ear: External ear normal.  Left Ear: External ear normal.  Eyes: . Pupils are equal, round, and reactive to light. Conjunctivae and EOM are normal Nose: without d/c or deformity Neck: Neck supple. Gross normal ROM Cardiovascular: Normal rate and regular rhythm.   Pulmonary/Chest: Effort normal and breath sounds without rales or wheezing.  Abd:  Soft, NT, ND, + BS, no organomegaly Neurological: Pt is alert. At baseline orientation, motor grossly intact Skin: Skin is warm. No rashes, other new lesions, no LE edema Psychiatric: Pt behavior is normal without agitation  All otherwise neg per pt  Lab Results  Component Value Date   WBC 5.3 12/15/2018   HGB 11.7 (L) 12/15/2018   HCT 35.6 (L) 12/15/2018   PLT 203.0 12/15/2018   GLUCOSE 102 (H) 06/14/2019   CHOL 159 06/14/2019   TRIG 143.0 06/14/2019   HDL 68.80 06/14/2019   LDLDIRECT 85.1 08/19/2006   LDLCALC 61 06/14/2019   ALT 17 06/14/2019   AST 22 06/14/2019   NA 143 06/14/2019   K 3.8 06/14/2019   CL 107 06/14/2019   CREATININE 0.82 06/14/2019   BUN 11 06/14/2019   CO2 26 06/14/2019   TSH 1.28 12/15/2018   INR 0.91 02/19/2012   HGBA1C 6.2 06/14/2019   MICROALBUR 4.2 (H) 12/15/2018         Assessment & Plan:

## 2019-06-19 NOTE — Patient Instructions (Addendum)
Please go to the XRAY Department in the first floor for the x-ray testing due to the history of positive PPD  Ok to increase the wellbutrin xl to 300 mg per day  The form was filled out today  Please continue all other medications as before, and refills have been done if requested.  Please have the pharmacy call with any other refills you may need.  Please continue your efforts at being more active, low cholesterol diet, and weight control.  Please keep your appointments with your specialists as you may have planned  Please return in 6 months, or sooner if needed, with Lab testing done 3-5 days before

## 2019-06-23 ENCOUNTER — Encounter: Payer: Self-pay | Admitting: Internal Medicine

## 2019-06-23 NOTE — Assessment & Plan Note (Signed)
stable overall by history and exam, recent data reviewed with pt, and pt to continue medical treatment as before,  to f/u any worsening symptoms or concerns  

## 2019-06-23 NOTE — Assessment & Plan Note (Signed)
For cxr as needed

## 2019-06-23 NOTE — Assessment & Plan Note (Signed)
Mild worsening, for increased wellbutrin xl 300 qd, cont all other tx, declines psychiatry

## 2019-06-28 DIAGNOSIS — M79602 Pain in left arm: Secondary | ICD-10-CM | POA: Diagnosis not present

## 2019-06-28 DIAGNOSIS — Z79899 Other long term (current) drug therapy: Secondary | ICD-10-CM | POA: Diagnosis not present

## 2019-07-03 ENCOUNTER — Telehealth: Payer: Self-pay | Admitting: Internal Medicine

## 2019-07-03 NOTE — Telephone Encounter (Signed)
   Scheduler called patient to schedule 62mo  Labs, daughter states patient will walk in at [facility] to have labs

## 2019-07-20 ENCOUNTER — Other Ambulatory Visit: Payer: Self-pay | Admitting: Internal Medicine

## 2019-07-20 NOTE — Telephone Encounter (Signed)
Done erx 

## 2019-07-24 ENCOUNTER — Ambulatory Visit (INDEPENDENT_AMBULATORY_CARE_PROVIDER_SITE_OTHER): Payer: Medicare Other | Admitting: Family

## 2019-07-24 DIAGNOSIS — J209 Acute bronchitis, unspecified: Secondary | ICD-10-CM

## 2019-07-24 MED ORDER — BENZONATATE 100 MG PO CAPS: 100.0000 mg | ORAL_CAPSULE | Freq: Three times a day (TID) | ORAL | 0 refills | Status: DC | PRN

## 2019-07-24 MED ORDER — AZITHROMYCIN 250 MG PO TABS: ORAL_TABLET | ORAL | 0 refills | Status: DC

## 2019-07-24 NOTE — Progress Notes (Signed)
Latasha Wang is a 79 y.o. female with the following history as recorded in Epic[employer]:  Patient Active Problem List   Diagnosis Date Noted  . History of positive PPD 06/19/2019  . Excessive ear wax, right 12/10/2017  . Left upper arm pain 12/10/2017  . Contusion of left leg 01/12/2017  . Right knee pain 01/12/2017  . Heart murmur 06/14/2016  . Insomnia 06/14/2016  . Anemia 06/09/2016  . Elevated bilirubin 03/19/2016  . Sepsis ([place]) 03/17/2016  . Confusion   . Debility   . Fatty liver disease, nonalcoholic   . Hypokalemia   . Acute blood loss anemia   . Controlled diabetes mellitus type 2 with complications ([place])   . Acute kidney injury ([place]) 03/08/2016  . Hyponatremia 03/08/2016  . Acute encephalopathy 03/08/2016  . Elevated lactic acid level 03/08/2016  . FUO (fever of unknown origin)   . Memory dysfunction 06/13/2013  . Humerus distal fracture, left 02/19/2012  . Preventative health care 12/10/2010  . LATERAL EPICONDYLITIS, RIGHT 12/17/2008  . Anxiety state 06/20/2008  . Gastroparesis 09/01/2007  . FATTY LIVER DISEASE 09/01/2007  . VERTIGO, CHRONIC 09/01/2007  . LIVER FUNCTION TESTS, ABNORMAL 07/19/2007  . Hyperlipidemia 07/05/2007  . Other malaise and fatigue 07/05/2007  . Depression 05/09/2007  . Essential hypertension 05/09/2007  . GERD 05/09/2007  . MYALGIA 05/09/2007    Current Outpatient Medications  Medication Sig Dispense Refill  . acetaminophen (TYLENOL) 500 MG tablet Take 1-2 tablets (500-1,000 mg total) by mouth every 6 (six) hours as needed for pain or fever. For pain 90 tablet 0  . albuterol (PROVENTIL HFA;VENTOLIN HFA) 108 (90 BASE) MCG/ACT inhaler Inhale 2 puffs into the lungs every 6 (six) hours as needed for wheezing. 1 Inhaler 0  . alendronate (FOSAMAX) 70 MG tablet TAKE 1 TABLET BY MOUTH EVERY 7 DAYS. TAKE WITH A FULL GLASS OF WATER ON AN EMPTY STOMACH (Patient not taking: Reported on 06/19/2019) 12 tablet 0  . amLODipine (NORVASC) 10 MG tablet TAKE  1 TABLET BY MOUTH EVERY DAY 90 tablet 1  . azithromycin (ZITHROMAX) 250 MG tablet 2 tabs po qd x 1 day; 1 tablet per day x 4 days; 6 tablet 0  . benzonatate (TESSALON) 100 MG capsule Take 1 capsule (100 mg total) by mouth 3 (three) times daily as needed. 30 capsule 0  . buPROPion (WELLBUTRIN XL) 300 MG 24 hr tablet Take 1 tablet (300 mg total) by mouth daily. 90 tablet 3  . chlorhexidine (PERIDEX) 0.12 % solution Use as directed 30 mLs in the mouth or throat 3 (three) times daily.    . clonazePAM (KLONOPIN) 0.5 MG tablet TAKE 1 TABLET BY MOUTH TWICE A DAY AS NEEDED 60 tablet 5  . FLUoxetine (PROZAC) 40 MG capsule TAKE 1 CAPSULE BY MOUTH EVERY DAY 90 capsule 1  . hydrochlorothiazide (MICROZIDE) 12.5 MG capsule TAKE ONE CAPSULE BY MOUTH EVERY DAY 90 capsule 3  . irbesartan (AVAPRO) 300 MG tablet TAKE 1 TABLET (300 MG TOTAL) BY MOUTH AT BEDTIME. 90 tablet 1  . ketoconazole (NIZORAL) 2 % shampoo Apply 1 application topically every Friday.  4  . meclizine (ANTIVERT) 12.5 MG tablet TAKE 1-2 TABLETS (12.5-25 MG TOTAL) BY MOUTH EVERY 6 (SIX) HOURS AS NEEDED. 30 tablet 2  . metFORMIN (GLUCOPHAGE-XR) 500 MG 24 hr tablet TAKE 1 TABLET BY MOUTH TWICE A DAY 180 tablet 1  . NARCAN 4 MG/0.1ML LIQD nasal spray kit   0  . OLANZapine (ZYPREXA) 5 MG tablet TAKE 1 TABLET  BY MOUTH EVERY DAY 90 tablet 1  . pioglitazone (ACTOS) 45 MG tablet TAKE 1 TABLET (45 MG TOTAL) BY MOUTH DAILY. 90 tablet 2  . rosuvastatin (CRESTOR) 10 MG tablet TAKE 1 TABLET BY MOUTH EVERY DAY IN THE EVENING 90 tablet 1  . traMADol (ULTRAM) 50 MG tablet Take 1 tablet (50 mg total) by mouth every 6 (six) hours as needed. (Patient not taking: Reported on 06/19/2019) 30 tablet 0   No current facility-administered medications for this visit.    Allergies: Patient has no known allergies.  Past Medical History:  Diagnosis Date  . ANXIETY   . DEPRESSION   . Diabetes mellitus   . Essential hypertension 05/09/2007   Qualifier: Diagnosis of  By: Wynona Luna   . FATTY LIVER DISEASE   . Gastroparesis   . GERD   . HYPERLIPIDEMIA   . HYPERTENSION   . LATERAL EPICONDYLITIS, RIGHT     Past Surgical History:  Procedure Laterality Date  . CATARACT EXTRACTION, BILATERAL    . TOTAL ELBOW ARTHROPLASTY  02/19/2012   Procedure: TOTAL ELBOW ARTHROPLASTY;  Surgeon: Rozanna Box, MD;  Location: [facility];  Service: Orthopedics;  Laterality: Left;    Family History  Problem Relation Age of Onset  . Diabetes Neg Hx   . Hypertension Neg Hx     Social History   Tobacco Use  . Smoking status: Never Smoker  . Smokeless tobacco: Never Used  Substance Use Topics  . Alcohol use: No    Subjective:    I connected with Latasha Wang on 07/24/19 at 10:40 AM EST by a video enabled telemedicine application and verified that I am speaking with the correct person using two identifiers.   I discussed the limitations of evaluation and management by telemedicine and the availability of in person appointments. The patient expressed understanding and agreed to proceed.  Provider in office/ patient is at home; provider and patient's daughter patient are only 3 people on video call.   Per daughter who provider most of the history, the patient has been coughing x 1 week; her son-in-law tested positive for COVID 2 weeks ago but patient's COVID test was negative last Monday; concerned about productive cough but denies any chest pain or shortness of breath; no fever or wheezing; no difficulty breathing; per daughter, blood sugars have been normal; has used OTC cough syrup with limited benefit;     Objective:  There were no vitals filed for this visit.  General: Well developed, well nourished, in no acute distress  Lungs: Respirations unlabored;  Neurologic: Alert and oriented; speech intact; face symmetrical;   Assessment:  1. Acute bronchitis, unspecified organism     Plan:  Recent negative COVID test; will treat with Z-pak and Tessalon Perles;  increase fluids, rest and follow-up worse, no better; may need CXR is symptoms persist.  No follow-ups on file.  No orders of the defined types were placed in this encounter.   Requested Prescriptions   Signed Prescriptions Disp Refills  . benzonatate (TESSALON) 100 MG capsule 30 capsule 0    Sig: Take 1 capsule (100 mg total) by mouth 3 (three) times daily as needed.  Wang Kitchen azithromycin (ZITHROMAX) 250 MG tablet 6 tablet 0    Sig: 2 tabs po qd x 1 day; 1 tablet per day x 4 days;

## 2019-07-28 DIAGNOSIS — R05 Cough: Secondary | ICD-10-CM | POA: Diagnosis not present

## 2019-07-28 DIAGNOSIS — M79602 Pain in left arm: Secondary | ICD-10-CM | POA: Diagnosis not present

## 2019-07-28 DIAGNOSIS — Z79899 Other long term (current) drug therapy: Secondary | ICD-10-CM | POA: Diagnosis not present

## 2019-08-18 ENCOUNTER — Other Ambulatory Visit: Payer: Self-pay

## 2019-08-18 MED ORDER — AMLODIPINE BESYLATE 10 MG PO TABS: 10.0000 mg | ORAL_TABLET | Freq: Every day | ORAL | 0 refills | Status: DC

## 2019-08-18 MED ORDER — ROSUVASTATIN CALCIUM 10 MG PO TABS: ORAL_TABLET | ORAL | 1 refills | Status: DC

## 2019-08-18 MED ORDER — METFORMIN HCL ER 500 MG PO TB24: 500.0000 mg | ORAL_TABLET | Freq: Two times a day (BID) | ORAL | 0 refills | Status: DC

## 2019-08-21 ENCOUNTER — Other Ambulatory Visit: Payer: Self-pay

## 2019-08-21 MED ORDER — OLANZAPINE 5 MG PO TABS: 5.0000 mg | ORAL_TABLET | Freq: Every day | ORAL | 0 refills | Status: DC

## 2019-08-25 DIAGNOSIS — Z79899 Other long term (current) drug therapy: Secondary | ICD-10-CM | POA: Diagnosis not present

## 2019-08-25 DIAGNOSIS — U071 COVID-19: Secondary | ICD-10-CM | POA: Diagnosis not present

## 2019-08-25 DIAGNOSIS — M79602 Pain in left arm: Secondary | ICD-10-CM | POA: Diagnosis not present

## 2019-09-01 ENCOUNTER — Ambulatory Visit: Payer: Medicare Other

## 2019-09-16 ENCOUNTER — Other Ambulatory Visit: Payer: Self-pay | Admitting: Internal Medicine

## 2019-09-16 NOTE — Telephone Encounter (Signed)
Please refill as per office routine med refill policy (all routine meds refilled for 3 mo or monthly per pt preference up to one year from last visit, then month to month grace period for 3 mo, then further med refills will have to be denied)  

## 2019-09-25 DIAGNOSIS — Z79899 Other long term (current) drug therapy: Secondary | ICD-10-CM | POA: Diagnosis not present

## 2019-09-25 DIAGNOSIS — E119 Type 2 diabetes mellitus without complications: Secondary | ICD-10-CM | POA: Diagnosis not present

## 2019-09-25 DIAGNOSIS — E559 Vitamin D deficiency, unspecified: Secondary | ICD-10-CM | POA: Diagnosis not present

## 2019-09-25 DIAGNOSIS — M79602 Pain in left arm: Secondary | ICD-10-CM | POA: Diagnosis not present

## 2019-10-25 DIAGNOSIS — M79602 Pain in left arm: Secondary | ICD-10-CM | POA: Diagnosis not present

## 2019-10-25 DIAGNOSIS — Z79899 Other long term (current) drug therapy: Secondary | ICD-10-CM | POA: Diagnosis not present

## 2019-10-25 DIAGNOSIS — I10 Essential (primary) hypertension: Secondary | ICD-10-CM | POA: Diagnosis not present

## 2019-11-09 ENCOUNTER — Other Ambulatory Visit: Payer: Self-pay | Admitting: Internal Medicine

## 2019-11-13 DIAGNOSIS — R5383 Other fatigue: Secondary | ICD-10-CM | POA: Diagnosis not present

## 2019-11-13 DIAGNOSIS — E119 Type 2 diabetes mellitus without complications: Secondary | ICD-10-CM | POA: Diagnosis not present

## 2019-11-13 DIAGNOSIS — Z79899 Other long term (current) drug therapy: Secondary | ICD-10-CM | POA: Diagnosis not present

## 2019-11-13 DIAGNOSIS — Z1159 Encounter for screening for other viral diseases: Secondary | ICD-10-CM | POA: Diagnosis not present

## 2019-11-13 DIAGNOSIS — Z0000 Encounter for general adult medical examination without abnormal findings: Secondary | ICD-10-CM | POA: Diagnosis not present

## 2019-11-13 DIAGNOSIS — R0602 Shortness of breath: Secondary | ICD-10-CM | POA: Diagnosis not present

## 2019-11-13 DIAGNOSIS — E559 Vitamin D deficiency, unspecified: Secondary | ICD-10-CM | POA: Diagnosis not present

## 2019-11-23 ENCOUNTER — Other Ambulatory Visit: Payer: Self-pay | Admitting: Internal Medicine

## 2019-11-24 DIAGNOSIS — Z79899 Other long term (current) drug therapy: Secondary | ICD-10-CM | POA: Diagnosis not present

## 2019-11-24 DIAGNOSIS — E559 Vitamin D deficiency, unspecified: Secondary | ICD-10-CM | POA: Diagnosis not present

## 2019-11-24 DIAGNOSIS — M79602 Pain in left arm: Secondary | ICD-10-CM | POA: Diagnosis not present

## 2019-12-07 ENCOUNTER — Other Ambulatory Visit: Payer: Self-pay | Admitting: Internal Medicine

## 2019-12-13 ENCOUNTER — Other Ambulatory Visit: Payer: Self-pay | Admitting: Internal Medicine

## 2019-12-13 NOTE — Telephone Encounter (Signed)
Please refill as per office routine med refill policy (all routine meds refilled for 3 mo or monthly per pt preference up to one year from last visit, then month to month grace period for 3 mo, then further med refills will have to be denied)  

## 2019-12-18 ENCOUNTER — Ambulatory Visit: Payer: Medicare Other | Admitting: Internal Medicine

## 2019-12-21 DIAGNOSIS — Z79899 Other long term (current) drug therapy: Secondary | ICD-10-CM | POA: Diagnosis not present

## 2019-12-21 DIAGNOSIS — I10 Essential (primary) hypertension: Secondary | ICD-10-CM | POA: Diagnosis not present

## 2019-12-21 DIAGNOSIS — M79602 Pain in left arm: Secondary | ICD-10-CM | POA: Diagnosis not present

## 2019-12-22 ENCOUNTER — Other Ambulatory Visit: Payer: Self-pay | Admitting: Internal Medicine

## 2019-12-22 NOTE — Telephone Encounter (Signed)
Done erx 

## 2020-01-02 ENCOUNTER — Ambulatory Visit: Payer: Medicare Other | Admitting: Internal Medicine

## 2020-01-02 DIAGNOSIS — Z0289 Encounter for other administrative examinations: Secondary | ICD-10-CM

## 2020-01-19 ENCOUNTER — Other Ambulatory Visit: Payer: Self-pay | Admitting: Internal Medicine

## 2020-01-19 DIAGNOSIS — I10 Essential (primary) hypertension: Secondary | ICD-10-CM | POA: Diagnosis not present

## 2020-01-19 DIAGNOSIS — M79602 Pain in left arm: Secondary | ICD-10-CM | POA: Diagnosis not present

## 2020-01-19 DIAGNOSIS — E119 Type 2 diabetes mellitus without complications: Secondary | ICD-10-CM | POA: Diagnosis not present

## 2020-01-19 DIAGNOSIS — Z79899 Other long term (current) drug therapy: Secondary | ICD-10-CM | POA: Diagnosis not present

## 2020-01-20 ENCOUNTER — Other Ambulatory Visit: Payer: Self-pay | Admitting: Internal Medicine

## 2020-01-21 NOTE — Telephone Encounter (Signed)
Done erx 

## 2020-02-02 ENCOUNTER — Encounter: Payer: Self-pay | Admitting: Internal Medicine

## 2020-02-02 ENCOUNTER — Telehealth (INDEPENDENT_AMBULATORY_CARE_PROVIDER_SITE_OTHER): Payer: Medicare Other | Admitting: Internal Medicine

## 2020-02-02 DIAGNOSIS — E538 Deficiency of other specified B group vitamins: Secondary | ICD-10-CM | POA: Diagnosis not present

## 2020-02-02 DIAGNOSIS — E118 Type 2 diabetes mellitus with unspecified complications: Secondary | ICD-10-CM

## 2020-02-02 DIAGNOSIS — Z0000 Encounter for general adult medical examination without abnormal findings: Secondary | ICD-10-CM

## 2020-02-02 DIAGNOSIS — E559 Vitamin D deficiency, unspecified: Secondary | ICD-10-CM

## 2020-02-02 DIAGNOSIS — Z20822 Contact with and (suspected) exposure to covid-19: Secondary | ICD-10-CM

## 2020-02-02 NOTE — Assessment & Plan Note (Signed)

## 2020-02-02 NOTE — Assessment & Plan Note (Signed)
stable overall by history and exam, recent data reviewed with pt, and pt to continue medical treatment as before,  to f/u any worsening symptoms or concerns, for labs after proven neg for covid

## 2020-02-02 NOTE — Assessment & Plan Note (Signed)
Agree with recheck covid infection status

## 2020-02-02 NOTE — Patient Instructions (Signed)
Please continue all other medications as before, and refills have been done if requested.  Please have the pharmacy call with any other refills you may need.  Please continue your efforts at being more active, low cholesterol diet, and weight control.  You are otherwise up to date with prevention measures today.  Please keep your appointments with your specialists as you may have planned  Please proceed with the covid re-check  Please go to the LAB at the blood drawing area for the tests to be done after you are covid negative  You will be contacted by phone if any changes need to be made immediately.  Otherwise, you will receive a letter about your results with an explanation, but please check with MyChart first.  Please remember to sign up for MyChart if you have not done so, as this will be important to you in the future with finding out test results, communicating by private email, and scheduling acute appointments online when needed.  Please make an Appointment to return in 6 months, or sooner if needed

## 2020-02-02 NOTE — Progress Notes (Signed)
Patient ID: Latasha Wang, female   DOB: [birth year], 79 y.o.   MRN: [id number]  Virtual Visit via Video Note  I connected with Latasha Wang on 02/02/20 at  1:00 PM EDT by a video enabled telemedicine application and verified that I am speaking with the correct person using two identifiers.  Location of all participants today Patient: at home, also with Latasha Wang Latasha Wang Provider: at office   I discussed the limitations of evaluation and management by telemedicine and the availability of in person appointments. The patient expressed understanding and agreed to proceed.  History of Present Illness: Here for wellness and f/u;  Overall doing ok;  Pt denies Chest pain, worsening SOB, DOE, wheezing, orthopnea, PND, worsening LE edema, palpitations, dizziness or syncope.  Pt denies neurological change such as new headache, facial or extremity weakness.  Pt denies polydipsia, polyuria, or low sugar symptoms. Pt states overall good compliance with treatment and medications, good tolerability, and has been trying to follow appropriate diet.  Pt denies worsening depressive symptoms, suicidal ideation or panic. No fever, night sweats, wt loss, loss of appetite, or other constitutional symptoms.  Pt states good ability with ADL's, has low fall risk, home safety reviewed and adequate, no other significant changes in hearing or vision, and only occasionally active with exercise. Also of note pt had covid infection herself in late 2020, but was re-exposed after contact with family member she lives with known COVID + since July 31.  Pt was tested neg aug 1, remains asympt, but Latasha Wang plans to have her tested again Past Medical History:  Diagnosis Date  . ANXIETY   . DEPRESSION   . Diabetes mellitus   . Essential hypertension 05/09/2007   Qualifier: Diagnosis of  By: Wynona Luna   . FATTY LIVER DISEASE   . Gastroparesis   . GERD   . HYPERLIPIDEMIA   . HYPERTENSION   . LATERAL EPICONDYLITIS, RIGHT     Past Surgical History:  Procedure Laterality Date  . CATARACT EXTRACTION, BILATERAL    . TOTAL ELBOW ARTHROPLASTY  02/19/2012   Procedure: TOTAL ELBOW ARTHROPLASTY;  Surgeon: Rozanna Box, MD;  Location: [facility];  Service: Orthopedics;  Laterality: Left;    reports that she has never smoked. She has never used smokeless tobacco. She reports that she does not drink alcohol and does not use drugs. family history is not on file. No Known Allergies Current Outpatient Medications on File Prior to Visit  Medication Sig Dispense Refill  . acetaminophen (TYLENOL) 500 MG tablet Take 1-2 tablets (500-1,000 mg total) by mouth every 6 (six) hours as needed for pain or fever. For pain 90 tablet 0  . albuterol (PROVENTIL HFA;VENTOLIN HFA) 108 (90 BASE) MCG/ACT inhaler Inhale 2 puffs into the lungs every 6 (six) hours as needed for wheezing. 1 Inhaler 0  . alendronate (FOSAMAX) 70 MG tablet TAKE 1 TABLET BY MOUTH EVERY 7 DAYS. TAKE WITH A FULL GLASS OF WATER ON AN EMPTY STOMACH 12 tablet 0  . amLODipine (NORVASC) 10 MG tablet TAKE 1 TABLET BY MOUTH EVERY DAY 90 tablet 1  . azithromycin (ZITHROMAX) 250 MG tablet 2 tabs po qd x 1 day; 1 tablet per day x 4 days; 6 tablet 0  . benzonatate (TESSALON) 100 MG capsule Take 1 capsule (100 mg total) by mouth 3 (three) times daily as needed. 30 capsule 0  . buPROPion (WELLBUTRIN XL) 300 MG 24 hr tablet Take 1 tablet (300 mg total) by mouth daily.  90 tablet 3  . chlorhexidine (PERIDEX) 0.12 % solution Use as directed 30 mLs in the mouth or throat 3 (three) times daily.    . clonazePAM (KLONOPIN) 0.5 MG tablet TAKE 1 TABLET BY MOUTH TWICE A DAY AS NEEDED 60 tablet 4  . FLUoxetine (PROZAC) 40 MG capsule TAKE 1 CAPSULE BY MOUTH EVERY DAY 90 capsule 1  . hydrochlorothiazide (MICROZIDE) 12.5 MG capsule TAKE ONE CAPSULE BY MOUTH EVERY DAY 90 capsule 3  . irbesartan (AVAPRO) 300 MG tablet TAKE 1 TABLET (300 MG TOTAL) BY MOUTH AT BEDTIME. 90 tablet 1  . ketoconazole  (NIZORAL) 2 % shampoo Apply 1 application topically every Friday.  4  . meclizine (ANTIVERT) 12.5 MG tablet TAKE 1-2 TABLETS (12.5-25 MG TOTAL) BY MOUTH EVERY 6 (SIX) HOURS AS NEEDED. 30 tablet 2  . metFORMIN (GLUCOPHAGE-XR) 500 MG 24 hr tablet TAKE 1 TABLET BY MOUTH TWICE A DAY 180 tablet 0  . NARCAN 4 MG/0.1ML LIQD nasal spray kit   0  . OLANZapine (ZYPREXA) 5 MG tablet TAKE 1 TABLET (5 MG TOTAL) BY MOUTH DAILY. ANNUAL APPT IS DUE MUST SEE PROVIDER FOR FUTURE REFILLS 90 tablet 1  . pioglitazone (ACTOS) 45 MG tablet TAKE 1 TABLET (45 MG TOTAL) BY MOUTH DAILY. 90 tablet 1  . rosuvastatin (CRESTOR) 10 MG tablet TAKE 1 TABLET BY MOUTH EVERY EVENING Must keep 02/02/20 appt for future refills 30 tablet 0  . traMADol (ULTRAM) 50 MG tablet Take 1 tablet (50 mg total) by mouth every 6 (six) hours as needed. 30 tablet 0   No current facility-administered medications on file prior to visit.    Observations/Objective: Alert, NAD, appropriate mood and affect, resps normal, cn 2-12 intact, moves all 4s, no visible rash or swelling Lab Results  Component Value Date   WBC 5.3 12/15/2018   HGB 11.7 (L) 12/15/2018   HCT 35.6 (L) 12/15/2018   PLT 203.0 12/15/2018   GLUCOSE 102 (H) 06/14/2019   CHOL 159 06/14/2019   TRIG 143.0 06/14/2019   HDL 68.80 06/14/2019   LDLDIRECT 85.1 08/19/2006   LDLCALC 61 06/14/2019   ALT 17 06/14/2019   AST 22 06/14/2019   NA 143 06/14/2019   K 3.8 06/14/2019   CL 107 06/14/2019   CREATININE 0.82 06/14/2019   BUN 11 06/14/2019   CO2 26 06/14/2019   TSH 1.28 12/15/2018   INR 0.91 02/19/2012   HGBA1C 6.2 06/14/2019   MICROALBUR 4.2 (H) 12/15/2018   Assessment and Plan: See notes  Follow Up Instructions: See notes   I discussed the assessment and treatment plan with the patient. The patient was provided an opportunity to ask questions and all were answered. The patient agreed with the plan and demonstrated an understanding of the instructions.   The patient was  advised to call back or seek an in-person evaluation if the symptoms worsen or if the condition fails to improve as anticipated.  Cathlean Cower, MD

## 2020-02-15 ENCOUNTER — Other Ambulatory Visit: Payer: Self-pay | Admitting: Internal Medicine

## 2020-02-15 DIAGNOSIS — Z79899 Other long term (current) drug therapy: Secondary | ICD-10-CM | POA: Diagnosis not present

## 2020-02-15 DIAGNOSIS — E119 Type 2 diabetes mellitus without complications: Secondary | ICD-10-CM | POA: Diagnosis not present

## 2020-02-15 DIAGNOSIS — I10 Essential (primary) hypertension: Secondary | ICD-10-CM | POA: Diagnosis not present

## 2020-02-15 DIAGNOSIS — M79602 Pain in left arm: Secondary | ICD-10-CM | POA: Diagnosis not present

## 2020-02-22 ENCOUNTER — Other Ambulatory Visit: Payer: Self-pay | Admitting: Internal Medicine

## 2020-02-22 NOTE — Telephone Encounter (Signed)
Please refill as per office routine med refill policy (all routine meds refilled for 3 mo or monthly per pt preference up to one year from last visit, then month to month grace period for 3 mo, then further med refills will have to be denied)  

## 2020-03-10 ENCOUNTER — Other Ambulatory Visit: Payer: Self-pay | Admitting: Internal Medicine

## 2020-03-10 NOTE — Telephone Encounter (Signed)
Please refill as per office routine med refill policy (all routine meds refilled for 3 mo or monthly per pt preference up to one year from last visit, then month to month grace period for 3 mo, then further med refills will have to be denied)  

## 2020-03-13 ENCOUNTER — Other Ambulatory Visit: Payer: Self-pay | Admitting: Internal Medicine

## 2020-03-19 DIAGNOSIS — M79602 Pain in left arm: Secondary | ICD-10-CM | POA: Diagnosis not present

## 2020-03-19 DIAGNOSIS — E119 Type 2 diabetes mellitus without complications: Secondary | ICD-10-CM | POA: Diagnosis not present

## 2020-03-19 DIAGNOSIS — Z79899 Other long term (current) drug therapy: Secondary | ICD-10-CM | POA: Diagnosis not present

## 2020-04-17 DIAGNOSIS — M546 Pain in thoracic spine: Secondary | ICD-10-CM | POA: Diagnosis not present

## 2020-04-17 DIAGNOSIS — M79602 Pain in left arm: Secondary | ICD-10-CM | POA: Diagnosis not present

## 2020-04-17 DIAGNOSIS — M25512 Pain in left shoulder: Secondary | ICD-10-CM | POA: Diagnosis not present

## 2020-04-17 DIAGNOSIS — Z79899 Other long term (current) drug therapy: Secondary | ICD-10-CM | POA: Diagnosis not present

## 2020-04-17 DIAGNOSIS — M542 Cervicalgia: Secondary | ICD-10-CM | POA: Diagnosis not present

## 2020-04-17 DIAGNOSIS — M25561 Pain in right knee: Secondary | ICD-10-CM | POA: Diagnosis not present

## 2020-04-17 DIAGNOSIS — M25562 Pain in left knee: Secondary | ICD-10-CM | POA: Diagnosis not present

## 2020-04-17 DIAGNOSIS — M545 Low back pain, unspecified: Secondary | ICD-10-CM | POA: Diagnosis not present

## 2020-04-17 DIAGNOSIS — E1140 Type 2 diabetes mellitus with diabetic neuropathy, unspecified: Secondary | ICD-10-CM | POA: Diagnosis not present

## 2020-05-14 DIAGNOSIS — Z23 Encounter for immunization: Secondary | ICD-10-CM | POA: Diagnosis not present

## 2020-05-14 DIAGNOSIS — M79602 Pain in left arm: Secondary | ICD-10-CM | POA: Diagnosis not present

## 2020-05-14 DIAGNOSIS — E119 Type 2 diabetes mellitus without complications: Secondary | ICD-10-CM | POA: Diagnosis not present

## 2020-05-14 DIAGNOSIS — Z79899 Other long term (current) drug therapy: Secondary | ICD-10-CM | POA: Diagnosis not present

## 2020-05-15 ENCOUNTER — Other Ambulatory Visit: Payer: Self-pay | Admitting: Internal Medicine

## 2020-05-26 ENCOUNTER — Other Ambulatory Visit: Payer: Self-pay | Admitting: Internal Medicine

## 2020-05-26 NOTE — Telephone Encounter (Signed)
Please refill as per office routine med refill policy (all routine meds refilled for 3 mo or monthly per pt preference up to one year from last visit, then month to month grace period for 3 mo, then further med refills will have to be denied)  Ok to contact pt however - ok to stop the med if has been on this for 5 yrs

## 2020-05-30 ENCOUNTER — Other Ambulatory Visit: Payer: Self-pay | Admitting: Internal Medicine

## 2020-06-11 DIAGNOSIS — Z79899 Other long term (current) drug therapy: Secondary | ICD-10-CM | POA: Diagnosis not present

## 2020-06-11 DIAGNOSIS — M25512 Pain in left shoulder: Secondary | ICD-10-CM | POA: Diagnosis not present

## 2020-06-11 DIAGNOSIS — M7502 Adhesive capsulitis of left shoulder: Secondary | ICD-10-CM | POA: Diagnosis not present

## 2020-06-11 DIAGNOSIS — G8929 Other chronic pain: Secondary | ICD-10-CM | POA: Diagnosis not present

## 2020-06-21 ENCOUNTER — Other Ambulatory Visit: Payer: Self-pay | Admitting: Internal Medicine

## 2020-06-21 NOTE — Telephone Encounter (Signed)
Please refill as per office routine med refill policy (all routine meds refilled for 3 mo or monthly per pt preference up to one year from last visit, then month to month grace period for 3 mo, then further med refills will have to be denied)  

## 2020-06-23 ENCOUNTER — Other Ambulatory Visit: Payer: Self-pay | Admitting: Internal Medicine

## 2020-07-01 ENCOUNTER — Ambulatory Visit: Payer: Medicare Other | Admitting: Internal Medicine

## 2020-07-02 ENCOUNTER — Other Ambulatory Visit (INDEPENDENT_AMBULATORY_CARE_PROVIDER_SITE_OTHER): Payer: Medicare Other

## 2020-07-02 DIAGNOSIS — E559 Vitamin D deficiency, unspecified: Secondary | ICD-10-CM

## 2020-07-02 DIAGNOSIS — Z0000 Encounter for general adult medical examination without abnormal findings: Secondary | ICD-10-CM | POA: Diagnosis not present

## 2020-07-02 DIAGNOSIS — E118 Type 2 diabetes mellitus with unspecified complications: Secondary | ICD-10-CM | POA: Diagnosis not present

## 2020-07-02 DIAGNOSIS — E538 Deficiency of other specified B group vitamins: Secondary | ICD-10-CM | POA: Diagnosis not present

## 2020-07-02 LAB — LIPID PANEL
Cholesterol: 132 mg/dL (ref 0–200)
HDL: 72.5 mg/dL (ref >39.00)
LDL Cholesterol: 27 mg/dL (ref 0–99)
NonHDL: 59.08
Total CHOL/HDL Ratio: 2
Triglycerides: 160 mg/dL — ABNORMAL HIGH (ref 0.0–149.0)
VLDL: 32 mg/dL (ref 0.0–40.0)

## 2020-07-02 LAB — MICROALBUMIN / CREATININE URINE RATIO
Creatinine,U: 169.1 mg/dL
Microalb Creat Ratio: 6.9 mg/g (ref 0.0–30.0)
Microalb, Ur: 11.6 mg/dL — ABNORMAL HIGH (ref 0.0–1.9)

## 2020-07-02 LAB — URINALYSIS, ROUTINE W REFLEX MICROSCOPIC
Bilirubin Urine: NEGATIVE
Hgb urine dipstick: NEGATIVE
Ketones, ur: NEGATIVE
Leukocytes,Ua: NEGATIVE
Nitrite: NEGATIVE
Specific Gravity, Urine: 1.025 (ref 1.000–1.030)
Urine Glucose: NEGATIVE
Urobilinogen, UA: 1 (ref 0.0–1.0)
pH: 6.5 (ref 5.0–8.0)

## 2020-07-02 LAB — CBC WITH DIFFERENTIAL/PLATELET
Basophils Absolute: 0.1 10*3/uL (ref 0.0–0.1)
Basophils Relative: 0.9 % (ref 0.0–3.0)
Eosinophils Absolute: 0.1 10*3/uL (ref 0.0–0.7)
Eosinophils Relative: 1.5 % (ref 0.0–5.0)
HCT: 36.7 % (ref 36.0–46.0)
Hemoglobin: 12.2 g/dL (ref 12.0–15.0)
Lymphocytes Relative: 22.7 % (ref 12.0–46.0)
Lymphs Abs: 1.5 10*3/uL (ref 0.7–4.0)
MCHC: 33.1 g/dL (ref 30.0–36.0)
MCV: 88.4 fl (ref 78.0–100.0)
Monocytes Absolute: 0.7 10*3/uL (ref 0.1–1.0)
Monocytes Relative: 9.8 % (ref 3.0–12.0)
Neutro Abs: 4.4 10*3/uL (ref 1.4–7.7)
Neutrophils Relative %: 65.1 % (ref 43.0–77.0)
Platelets: 284 10*3/uL (ref 150.0–400.0)
RBC: 4.15 Mil/uL (ref 3.87–5.11)
RDW: 15 % (ref 11.5–15.5)
WBC: 6.7 10*3/uL (ref 4.0–10.5)

## 2020-07-02 LAB — COMPREHENSIVE METABOLIC PANEL
ALT: 37 U/L — ABNORMAL HIGH (ref 0–35)
AST: 32 U/L (ref 0–37)
Albumin: 4.5 g/dL (ref 3.5–5.2)
Alkaline Phosphatase: 64 U/L (ref 39–117)
BUN: 14 mg/dL (ref 6–23)
CO2: 32 mEq/L (ref 19–32)
Calcium: 9.9 mg/dL (ref 8.4–10.5)
Chloride: 104 mEq/L (ref 96–112)
Creatinine, Ser: 0.97 mg/dL (ref 0.40–1.20)
GFR: 55.94 mL/min — ABNORMAL LOW (ref >60.00)
Glucose, Bld: 100 mg/dL — ABNORMAL HIGH (ref 70–99)
Potassium: 3.4 mEq/L — ABNORMAL LOW (ref 3.5–5.1)
Sodium: 146 mEq/L — ABNORMAL HIGH (ref 135–145)
Total Bilirubin: 1 mg/dL (ref 0.2–1.2)
Total Protein: 7.3 g/dL (ref 6.0–8.3)

## 2020-07-02 LAB — VITAMIN D 25 HYDROXY (VIT D DEFICIENCY, FRACTURES): VITD: 96.6 ng/mL (ref 30.00–100.00)

## 2020-07-02 LAB — TSH: TSH: 0.98 u[IU]/mL (ref 0.35–4.50)

## 2020-07-02 LAB — HEMOGLOBIN A1C: Hgb A1c MFr Bld: 6.2 % (ref 4.6–6.5)

## 2020-07-02 LAB — VITAMIN B12: Vitamin B-12: 389 pg/mL (ref 211–911)

## 2020-07-02 NOTE — Addendum Note (Signed)
Addended by: Vincenza Hews on: 07/02/2020 10:09 AM   Modules accepted: Orders

## 2020-07-03 ENCOUNTER — Ambulatory Visit: Payer: Medicare Other | Admitting: Internal Medicine

## 2020-07-08 ENCOUNTER — Ambulatory Visit: Payer: Medicare Other | Admitting: Internal Medicine

## 2020-07-12 ENCOUNTER — Ambulatory Visit: Payer: Medicare Other | Admitting: Internal Medicine

## 2020-07-15 DIAGNOSIS — Z79899 Other long term (current) drug therapy: Secondary | ICD-10-CM | POA: Diagnosis not present

## 2020-07-15 DIAGNOSIS — M79602 Pain in left arm: Secondary | ICD-10-CM | POA: Diagnosis not present

## 2020-07-15 DIAGNOSIS — E119 Type 2 diabetes mellitus without complications: Secondary | ICD-10-CM | POA: Diagnosis not present

## 2020-07-16 ENCOUNTER — Other Ambulatory Visit: Payer: Self-pay

## 2020-07-17 ENCOUNTER — Ambulatory Visit: Payer: Medicare Other | Admitting: Internal Medicine

## 2020-07-24 ENCOUNTER — Other Ambulatory Visit: Payer: Self-pay | Admitting: Internal Medicine

## 2020-07-24 ENCOUNTER — Ambulatory Visit (INDEPENDENT_AMBULATORY_CARE_PROVIDER_SITE_OTHER): Payer: Medicare Other | Admitting: Internal Medicine

## 2020-07-24 ENCOUNTER — Encounter: Payer: Self-pay | Admitting: Internal Medicine

## 2020-07-24 ENCOUNTER — Other Ambulatory Visit: Payer: Self-pay

## 2020-07-24 VITALS — BP 162/80 | HR 70 | Temp 97.6°F | Ht 59.0 in | Wt 142.0 lb

## 2020-07-24 DIAGNOSIS — F0390 Unspecified dementia without behavioral disturbance: Secondary | ICD-10-CM | POA: Diagnosis not present

## 2020-07-24 DIAGNOSIS — N390 Urinary tract infection, site not specified: Secondary | ICD-10-CM | POA: Diagnosis not present

## 2020-07-24 DIAGNOSIS — E118 Type 2 diabetes mellitus with unspecified complications: Secondary | ICD-10-CM

## 2020-07-24 DIAGNOSIS — R296 Repeated falls: Secondary | ICD-10-CM | POA: Diagnosis not present

## 2020-07-24 DIAGNOSIS — I10 Essential (primary) hypertension: Secondary | ICD-10-CM

## 2020-07-24 DIAGNOSIS — R269 Unspecified abnormalities of gait and mobility: Secondary | ICD-10-CM | POA: Diagnosis not present

## 2020-07-24 DIAGNOSIS — R531 Weakness: Secondary | ICD-10-CM

## 2020-07-24 DIAGNOSIS — R443 Hallucinations, unspecified: Secondary | ICD-10-CM

## 2020-07-24 HISTORY — DX: Unspecified dementia, unspecified severity, without behavioral disturbance, psychotic disturbance, mood disturbance, and anxiety: F03.90

## 2020-07-24 LAB — URINALYSIS, ROUTINE W REFLEX MICROSCOPIC
Bilirubin Urine: NEGATIVE
Hgb urine dipstick: NEGATIVE
Ketones, ur: NEGATIVE
Leukocytes,Ua: NEGATIVE
Nitrite: NEGATIVE
Specific Gravity, Urine: 1.02 (ref 1.000–1.030)
Total Protein, Urine: NEGATIVE
Urine Glucose: NEGATIVE
Urobilinogen, UA: 0.2 (ref 0.0–1.0)
pH: 6 (ref 5.0–8.0)

## 2020-07-24 LAB — CBC WITH DIFFERENTIAL/PLATELET
Basophils Absolute: 0.1 10*3/uL (ref 0.0–0.1)
Basophils Relative: 1.2 % (ref 0.0–3.0)
Eosinophils Absolute: 0.1 10*3/uL (ref 0.0–0.7)
Eosinophils Relative: 1.3 % (ref 0.0–5.0)
HCT: 37.8 % (ref 36.0–46.0)
Hemoglobin: 12.4 g/dL (ref 12.0–15.0)
Lymphocytes Relative: 34.7 % (ref 12.0–46.0)
Lymphs Abs: 2.2 10*3/uL (ref 0.7–4.0)
MCHC: 32.9 g/dL (ref 30.0–36.0)
MCV: 90.2 fl (ref 78.0–100.0)
Monocytes Absolute: 0.5 10*3/uL (ref 0.1–1.0)
Monocytes Relative: 7.7 % (ref 3.0–12.0)
Neutro Abs: 3.6 10*3/uL (ref 1.4–7.7)
Neutrophils Relative %: 55.1 % (ref 43.0–77.0)
Platelets: 242 10*3/uL (ref 150.0–400.0)
RBC: 4.19 Mil/uL (ref 3.87–5.11)
RDW: 15.8 % — ABNORMAL HIGH (ref 11.5–15.5)
WBC: 6.5 10*3/uL (ref 4.0–10.5)

## 2020-07-24 LAB — BASIC METABOLIC PANEL
BUN: 13 mg/dL (ref 6–23)
CO2: 30 mEq/L (ref 19–32)
Calcium: 10 mg/dL (ref 8.4–10.5)
Chloride: 99 mEq/L (ref 96–112)
Creatinine, Ser: 0.87 mg/dL (ref 0.40–1.20)
GFR: 63.71 mL/min (ref >60.00)
Glucose, Bld: 75 mg/dL (ref 70–99)
Potassium: 3 mEq/L — ABNORMAL LOW (ref 3.5–5.1)
Sodium: 141 mEq/L (ref 135–145)

## 2020-07-24 MED ORDER — POTASSIUM CHLORIDE ER 10 MEQ PO TBCR: 10.0000 meq | EXTENDED_RELEASE_TABLET | Freq: Every day | ORAL | 0 refills | Status: DC

## 2020-07-24 NOTE — Progress Notes (Signed)
Established Patient Office Visit  Subjective:  Patient ID: Latasha Wang, female    DOB: July 30, 1940  Age: 80 y.o. MRN: [id number]       Chief Complaint: recurrent falls, confusion, hallucinations, and htn       HPI:  Latasha Wang is a 80 y.o. female here with family with recent onset mild worsening memory changes and behavior with confusion, hallucinations not related to ETOH, worsening generalized weakness and several fall without injury in the home.  Also not clear that pt I taking all meds as prescribed including BP meds.  Pt is s/p covid infection 2021 but no symtpoms or fever recent.  Pt states she sees a lady in white, and admits she keeps a knife under the bed at home.  Pt denies chest pain, increased sob or doe, wheezing, orthopnea, PND, increased LE swelling, palpitations, dizziness or syncope.   Pt denies polydipsia, polyuria.        Wt Readings from Last 3 Encounters:  07/24/20 142 lb (64.4 kg)  06/19/19 163 lb (73.9 kg)  12/19/18 154 lb (69.9 kg)   BP Readings from Last 3 Encounters:  07/24/20 (!) 162/80  06/19/19 124/76  12/19/18 124/86         Past Medical History:  Diagnosis Date  . ANXIETY   . Dementia ([place]) 07/24/2020  . DEPRESSION   . Diabetes mellitus   . Essential hypertension 05/09/2007   Qualifier: Diagnosis of  By: Wynona Luna   . FATTY LIVER DISEASE   . Gastroparesis   . GERD   . HYPERLIPIDEMIA   . HYPERTENSION   . LATERAL EPICONDYLITIS, RIGHT    Past Surgical History:  Procedure Laterality Date  . CATARACT EXTRACTION, BILATERAL    . TOTAL ELBOW ARTHROPLASTY  02/19/2012   Procedure: TOTAL ELBOW ARTHROPLASTY;  Surgeon: Rozanna Box, MD;  Location: [facility];  Service: Orthopedics;  Laterality: Left;    reports that she has never smoked. She has never used smokeless tobacco. She reports that she does not drink alcohol and does not use drugs. family history is not on file. No Known Allergies Current Outpatient Medications on File Prior to Visit   Medication Sig Dispense Refill  . acetaminophen (TYLENOL) 500 MG tablet Take 1-2 tablets (500-1,000 mg total) by mouth every 6 (six) hours as needed for pain or fever. For pain 90 tablet 0  . albuterol (PROVENTIL HFA;VENTOLIN HFA) 108 (90 BASE) MCG/ACT inhaler Inhale 2 puffs into the lungs every 6 (six) hours as needed for wheezing. 1 Inhaler 0  . alendronate (FOSAMAX) 70 MG tablet TAKE 1 TABLET BY MOUTH EVEYR 7 DAYS WITH FULL GLASS OF WATER ON EMPTY STOMACH 12 tablet 0  . amLODipine (NORVASC) 10 MG tablet TAKE 1 TABLET BY MOUTH EVERY DAY 90 tablet 1  . benzonatate (TESSALON) 100 MG capsule Take 1 capsule (100 mg total) by mouth 3 (three) times daily as needed. 30 capsule 0  . buPROPion (WELLBUTRIN XL) 300 MG 24 hr tablet TAKE 1 TABLET BY MOUTH EVERY DAY 90 tablet 3  . chlorhexidine (PERIDEX) 0.12 % solution Use as directed 30 mLs in the mouth or throat 3 (three) times daily.    . clonazePAM (KLONOPIN) 0.5 MG tablet TAKE 1 TABLET BY MOUTH TWICE A DAY AS NEEDED 60 tablet 4  . FLUoxetine (PROZAC) 40 MG capsule TAKE 1 CAPSULE BY MOUTH EVERY DAY 90 capsule 1  . hydrochlorothiazide (MICROZIDE) 12.5 MG capsule TAKE ONE CAPSULE BY MOUTH EVERY DAY 90  capsule 3  . irbesartan (AVAPRO) 300 MG tablet TAKE 1 TABLET (300 MG TOTAL) BY MOUTH AT BEDTIME. 90 tablet 1  . ketoconazole (NIZORAL) 2 % shampoo Apply 1 application topically every Friday.  4  . meclizine (ANTIVERT) 12.5 MG tablet TAKE 1-2 TABLETS (12.5-25 MG TOTAL) BY MOUTH EVERY 6 (SIX) HOURS AS NEEDED. 30 tablet 2  . metFORMIN (GLUCOPHAGE-XR) 500 MG 24 hr tablet TAKE 1 TABLET BY MOUTH TWICE A DAY 180 tablet 0  . NARCAN 4 MG/0.1ML LIQD nasal spray kit   0  . OLANZapine (ZYPREXA) 5 MG tablet Take 1 tablet (5 mg total) by mouth daily. 90 tablet 2  . pioglitazone (ACTOS) 45 MG tablet TAKE 1 TABLET (45 MG TOTAL) BY MOUTH DAILY. 90 tablet 1  . rosuvastatin (CRESTOR) 10 MG tablet TAKE 1 TABLET BY MOUTH EVERY EVENING MUST KEEP 02/02/20 APPT FOR FUTURE REFILLS 90  tablet 1  . traMADol (ULTRAM) 50 MG tablet Take 1 tablet (50 mg total) by mouth every 6 (six) hours as needed. 30 tablet 0   No current facility-administered medications on file prior to visit.        ROS:  All others reviewed and negative.  Objective        PE:  BP (!) 162/80   Pulse 70   Temp 97.6 F (36.4 C) (Oral)   Ht _0  (1.499 m)   Wt 142 lb (64.4 kg)   SpO2 97%   BMI 28.68 kg/m                 Constitutional: Pt appears in NAD               HENT: Head: NCAT.                Right Ear: External ear normal.                 Left Ear: External ear normal.                Eyes: . Pupils are equal, round, and reactive to light. Conjunctivae and EOM are normal               Nose: without d/c or deformity               Neck: Neck supple. Gross normal ROM               Cardiovascular: Normal rate and regular rhythm.                 Pulmonary/Chest: Effort normal and breath sounds without rales or wheezing.                Abd:  Soft, NT, ND, + BS, no organomegaly               Neurological: Pt is alert. At baseline orientation, motor grossly intact               Skin: Skin is warm. No rashes, no other new lesions, LE edema - trace bilat               Psychiatric: Pt behavior is normal without agitation   Micro: none  Cardiac tracings I have personally interpreted today:  none  Pertinent Radiological findings (summarize): none   Lab Results  Component Value Date   WBC 6.5 07/24/2020   HGB 12.4 07/24/2020   HCT 37.8 07/24/2020   PLT 242.0 07/24/2020   GLUCOSE 75 07/24/2020  CHOL 132 07/02/2020   TRIG 160.0 (H) 07/02/2020   HDL 72.50 07/02/2020   LDLDIRECT 85.1 08/19/2006   LDLCALC 27 07/02/2020   ALT 37 (H) 07/02/2020   AST 32 07/02/2020   NA 141 07/24/2020   K 3.0 (L) 07/24/2020   CL 99 07/24/2020   CREATININE 0.87 07/24/2020   BUN 13 07/24/2020   CO2 30 07/24/2020   TSH 0.98 07/02/2020   INR 0.91 02/19/2012   HGBA1C 6.2 07/02/2020   MICROALBUR 11.6 (H)  07/02/2020   Assessment/Plan:  Latasha Wang is a 80 y.o. Asian [4] female with  has a past medical history of ANXIETY, Dementia ([place]) (07/24/2020), DEPRESSION, Diabetes mellitus, Essential hypertension (05/09/2007), FATTY LIVER DISEASE, Gastroparesis, GERD, HYPERLIPIDEMIA, HYPERTENSION, and LATERAL EPICONDYLITIS, RIGHT.  Dementia ([place]) With mild worsening recently and possible behavioral issues developing, cont zyprexa, declines aricept, cns imaging or neurology or psychiatry referrals   Hallucinations Concerning behavior issue, declines med change for now or referral  Generalized weakness Pt agrees to lab testing, etiology unclear, will f/u labs  Recurrent falls [place] for home PT, may need cane or walker  Controlled diabetes mellitus type 2 with complications [facility]) Lab Results  Component Value Date   HGBA1C 6.2 07/02/2020   Stable, pt to continue current medical treatment metformin, actos  Current Outpatient Medications (Endocrine & Metabolic):  .  alendronate (FOSAMAX) 70 MG tablet, TAKE 1 TABLET BY MOUTH EVEYR 7 DAYS WITH FULL GLASS OF WATER ON EMPTY STOMACH .  metFORMIN (GLUCOPHAGE-XR) 500 MG 24 hr tablet, TAKE 1 TABLET BY MOUTH TWICE A DAY .  pioglitazone (ACTOS) 45 MG tablet, TAKE 1 TABLET (45 MG TOTAL) BY MOUTH DAILY.  Current Outpatient Medications (Cardiovascular):  .  amLODipine (NORVASC) 10 MG tablet, TAKE 1 TABLET BY MOUTH EVERY DAY .  hydrochlorothiazide (MICROZIDE) 12.5 MG capsule, TAKE ONE CAPSULE BY MOUTH EVERY DAY .  irbesartan (AVAPRO) 300 MG tablet, TAKE 1 TABLET (300 MG TOTAL) BY MOUTH AT BEDTIME. .  rosuvastatin (CRESTOR) 10 MG tablet, TAKE 1 TABLET BY MOUTH EVERY EVENING MUST KEEP 02/02/20 APPT FOR FUTURE REFILLS  Current Outpatient Medications (Respiratory):  .  albuterol (PROVENTIL HFA;VENTOLIN HFA) 108 (90 BASE) MCG/ACT inhaler, Inhale 2 puffs into the lungs every 6 (six) hours as needed for wheezing. .  benzonatate (TESSALON) 100 MG capsule, Take 1 capsule  (100 mg total) by mouth 3 (three) times daily as needed.  Current Outpatient Medications (Analgesics):  .  acetaminophen (TYLENOL) 500 MG tablet, Take 1-2 tablets (500-1,000 mg total) by mouth every 6 (six) hours as needed for pain or fever. For pain .  traMADol (ULTRAM) 50 MG tablet, Take 1 tablet (50 mg total) by mouth every 6 (six) hours as needed.   Current Outpatient Medications (Other):  Marland Kitchen  buPROPion (WELLBUTRIN XL) 300 MG 24 hr tablet, TAKE 1 TABLET BY MOUTH EVERY DAY .  chlorhexidine (PERIDEX) 0.12 % solution, Use as directed 30 mLs in the mouth or throat 3 (three) times daily. .  clonazePAM (KLONOPIN) 0.5 MG tablet, TAKE 1 TABLET BY MOUTH TWICE A DAY AS NEEDED .  FLUoxetine (PROZAC) 40 MG capsule, TAKE 1 CAPSULE BY MOUTH EVERY DAY .  ketoconazole (NIZORAL) 2 % shampoo, Apply 1 application topically every Friday. .  meclizine (ANTIVERT) 12.5 MG tablet, TAKE 1-2 TABLETS (12.5-25 MG TOTAL) BY MOUTH EVERY 6 (SIX) HOURS AS NEEDED. Marland Kitchen  NARCAN 4 MG/0.1ML LIQD nasal spray kit,  .  OLANZapine (ZYPREXA) 5 MG tablet, Take 1 tablet (5 mg total) by  mouth daily. .  potassium chloride (KLOR-CON) 10 MEQ tablet, Take 1 tablet (10 mEq total) by mouth daily for 5 days.   Essential hypertension BP Readings from Last 3 Encounters:  07/24/20 (!) 162/80  06/19/19 124/76  12/19/18 124/86   Uncontrolled I suspect with med non compliacne, urged compliance, pt declines med change,, pt to continue medical treatment amlodpine, hct, avapro   Followup: Return in about 3 months (around 10/21/2020).  Cathlean Cower, MD 07/28/2020 8:25 PM [facility]

## 2020-07-24 NOTE — Patient Instructions (Signed)
You will be contacted regarding the referral for: Home Physical Therapy  Please continue all other medications as before, and refills have been done if requested.  Please have the pharmacy call with any other refills you may need.  Please continue your efforts at being more active, low cholesterol diet, and weight control.  Please keep your appointments with your specialists as you may have planned  Please go to the LAB at the blood drawing area for the tests to be done  You will be contacted by phone if any changes need to be made immediately.  Otherwise, you will receive a letter about your results with an explanation, but please check with MyChart first.  Please remember to sign up for MyChart if you have not done so, as this will be important to you in the future with finding out test results, communicating by private email, and scheduling acute appointments online when needed.

## 2020-07-25 LAB — URINE CULTURE

## 2020-07-28 ENCOUNTER — Encounter: Payer: Self-pay | Admitting: Internal Medicine

## 2020-07-28 NOTE — Assessment & Plan Note (Signed)
Lab Results  Component Value Date   HGBA1C 6.2 07/02/2020   Stable, pt to continue current medical treatment metformin, actos  Current Outpatient Medications (Endocrine & Metabolic):  .  alendronate (FOSAMAX) 70 MG tablet, TAKE 1 TABLET BY MOUTH EVEYR 7 DAYS WITH FULL GLASS OF WATER ON EMPTY STOMACH .  metFORMIN (GLUCOPHAGE-XR) 500 MG 24 hr tablet, TAKE 1 TABLET BY MOUTH TWICE A DAY .  pioglitazone (ACTOS) 45 MG tablet, TAKE 1 TABLET (45 MG TOTAL) BY MOUTH DAILY.  Current Outpatient Medications (Cardiovascular):  .  amLODipine (NORVASC) 10 MG tablet, TAKE 1 TABLET BY MOUTH EVERY DAY .  hydrochlorothiazide (MICROZIDE) 12.5 MG capsule, TAKE ONE CAPSULE BY MOUTH EVERY DAY .  irbesartan (AVAPRO) 300 MG tablet, TAKE 1 TABLET (300 MG TOTAL) BY MOUTH AT BEDTIME. .  rosuvastatin (CRESTOR) 10 MG tablet, TAKE 1 TABLET BY MOUTH EVERY EVENING MUST KEEP 02/02/20 APPT FOR FUTURE REFILLS  Current Outpatient Medications (Respiratory):  .  albuterol (PROVENTIL HFA;VENTOLIN HFA) 108 (90 BASE) MCG/ACT inhaler, Inhale 2 puffs into the lungs every 6 (six) hours as needed for wheezing. .  benzonatate (TESSALON) 100 MG capsule, Take 1 capsule (100 mg total) by mouth 3 (three) times daily as needed.  Current Outpatient Medications (Analgesics):  .  acetaminophen (TYLENOL) 500 MG tablet, Take 1-2 tablets (500-1,000 mg total) by mouth every 6 (six) hours as needed for pain or fever. For pain .  traMADol (ULTRAM) 50 MG tablet, Take 1 tablet (50 mg total) by mouth every 6 (six) hours as needed.   Current Outpatient Medications (Other):  Marland Kitchen  buPROPion (WELLBUTRIN XL) 300 MG 24 hr tablet, TAKE 1 TABLET BY MOUTH EVERY DAY .  chlorhexidine (PERIDEX) 0.12 % solution, Use as directed 30 mLs in the mouth or throat 3 (three) times daily. .  clonazePAM (KLONOPIN) 0.5 MG tablet, TAKE 1 TABLET BY MOUTH TWICE A DAY AS NEEDED .  FLUoxetine (PROZAC) 40 MG capsule, TAKE 1 CAPSULE BY MOUTH EVERY DAY .  ketoconazole (NIZORAL) 2 %  shampoo, Apply 1 application topically every Friday. .  meclizine (ANTIVERT) 12.5 MG tablet, TAKE 1-2 TABLETS (12.5-25 MG TOTAL) BY MOUTH EVERY 6 (SIX) HOURS AS NEEDED. Marland Kitchen  NARCAN 4 MG/0.1ML LIQD nasal spray kit,  .  OLANZapine (ZYPREXA) 5 MG tablet, Take 1 tablet (5 mg total) by mouth daily. .  potassium chloride (KLOR-CON) 10 MEQ tablet, Take 1 tablet (10 mEq total) by mouth daily for 5 days.

## 2020-07-28 NOTE — Assessment & Plan Note (Signed)
Concerning behavior issue, declines med change for now or referral

## 2020-07-28 NOTE — Assessment & Plan Note (Signed)
Pt agrees to lab testing, etiology unclear, will f/u labs

## 2020-07-28 NOTE — Assessment & Plan Note (Addendum)
With mild worsening recently and possible behavioral issues developing, cont zyprexa, declines aricept, cns imaging or neurology or psychiatry referrals

## 2020-07-28 NOTE — Assessment & Plan Note (Signed)
BP Readings from Last 3 Encounters:  07/24/20 (!) 162/80  06/19/19 124/76  12/19/18 124/86   Uncontrolled I suspect with med non compliacne, urged compliance, pt declines med change,, pt to continue medical treatment amlodpine, hct, avapro

## 2020-07-28 NOTE — Assessment & Plan Note (Signed)
Ok for home PT, may need cane or walker

## 2020-08-13 DIAGNOSIS — M79602 Pain in left arm: Secondary | ICD-10-CM | POA: Diagnosis not present

## 2020-08-13 DIAGNOSIS — Z79899 Other long term (current) drug therapy: Secondary | ICD-10-CM | POA: Diagnosis not present

## 2020-08-13 DIAGNOSIS — E119 Type 2 diabetes mellitus without complications: Secondary | ICD-10-CM | POA: Diagnosis not present

## 2020-08-14 ENCOUNTER — Other Ambulatory Visit: Payer: Self-pay | Admitting: Internal Medicine

## 2020-08-14 DIAGNOSIS — F32A Depression, unspecified: Secondary | ICD-10-CM | POA: Diagnosis not present

## 2020-08-14 DIAGNOSIS — R42 Dizziness and giddiness: Secondary | ICD-10-CM | POA: Diagnosis not present

## 2020-08-14 DIAGNOSIS — K219 Gastro-esophageal reflux disease without esophagitis: Secondary | ICD-10-CM | POA: Diagnosis not present

## 2020-08-14 DIAGNOSIS — D649 Anemia, unspecified: Secondary | ICD-10-CM | POA: Diagnosis not present

## 2020-08-14 DIAGNOSIS — N390 Urinary tract infection, site not specified: Secondary | ICD-10-CM | POA: Diagnosis not present

## 2020-08-14 DIAGNOSIS — E1143 Type 2 diabetes mellitus with diabetic autonomic (poly)neuropathy: Secondary | ICD-10-CM | POA: Diagnosis not present

## 2020-08-14 DIAGNOSIS — Z7984 Long term (current) use of oral hypoglycemic drugs: Secondary | ICD-10-CM | POA: Diagnosis not present

## 2020-08-14 DIAGNOSIS — I10 Essential (primary) hypertension: Secondary | ICD-10-CM | POA: Diagnosis not present

## 2020-08-15 ENCOUNTER — Telehealth: Payer: Self-pay | Admitting: Internal Medicine

## 2020-08-15 NOTE — Telephone Encounter (Signed)
Ok verbals 

## 2020-08-15 NOTE — Telephone Encounter (Signed)
Latasha Wang w/ [facility] is requesting verbals for PT 1w9. Please advise

## 2020-08-16 ENCOUNTER — Telehealth: Payer: Self-pay

## 2020-08-16 NOTE — Telephone Encounter (Signed)
Latasha Wang given the verbal orders

## 2020-08-19 ENCOUNTER — Other Ambulatory Visit: Payer: Self-pay | Admitting: Internal Medicine

## 2020-08-19 NOTE — Telephone Encounter (Signed)
Please refill as per office routine med refill policy (all routine meds refilled for 3 mo or monthly per pt preference up to one year from last visit, then month to month grace period for 3 mo, then further med refills will have to be denied)  

## 2020-08-20 ENCOUNTER — Telehealth: Payer: Self-pay | Admitting: Internal Medicine

## 2020-08-20 NOTE — Telephone Encounter (Signed)
Sorry I dont know waht PDGM is

## 2020-08-20 NOTE — Telephone Encounter (Signed)
Darlene w/ [facility] called and is requesting a more specific diagnosis code. She said that Vertigo is not acceptable for the [facility].    Okay to LVM: [phone]

## 2020-08-20 NOTE — Telephone Encounter (Signed)
The dementia - thanks

## 2020-08-21 NOTE — Telephone Encounter (Signed)
Latasha Wang notified of the dx code Dementia.

## 2020-08-28 DIAGNOSIS — D649 Anemia, unspecified: Secondary | ICD-10-CM | POA: Diagnosis not present

## 2020-08-28 DIAGNOSIS — E1143 Type 2 diabetes mellitus with diabetic autonomic (poly)neuropathy: Secondary | ICD-10-CM | POA: Diagnosis not present

## 2020-08-28 DIAGNOSIS — R42 Dizziness and giddiness: Secondary | ICD-10-CM | POA: Diagnosis not present

## 2020-08-28 DIAGNOSIS — N390 Urinary tract infection, site not specified: Secondary | ICD-10-CM | POA: Diagnosis not present

## 2020-08-28 DIAGNOSIS — I10 Essential (primary) hypertension: Secondary | ICD-10-CM | POA: Diagnosis not present

## 2020-08-28 DIAGNOSIS — F32A Depression, unspecified: Secondary | ICD-10-CM | POA: Diagnosis not present

## 2020-08-28 DIAGNOSIS — K219 Gastro-esophageal reflux disease without esophagitis: Secondary | ICD-10-CM | POA: Diagnosis not present

## 2020-08-28 DIAGNOSIS — Z7984 Long term (current) use of oral hypoglycemic drugs: Secondary | ICD-10-CM | POA: Diagnosis not present

## 2020-09-04 DIAGNOSIS — N390 Urinary tract infection, site not specified: Secondary | ICD-10-CM | POA: Diagnosis not present

## 2020-09-04 DIAGNOSIS — K219 Gastro-esophageal reflux disease without esophagitis: Secondary | ICD-10-CM | POA: Diagnosis not present

## 2020-09-04 DIAGNOSIS — R42 Dizziness and giddiness: Secondary | ICD-10-CM | POA: Diagnosis not present

## 2020-09-04 DIAGNOSIS — Z7984 Long term (current) use of oral hypoglycemic drugs: Secondary | ICD-10-CM | POA: Diagnosis not present

## 2020-09-04 DIAGNOSIS — I10 Essential (primary) hypertension: Secondary | ICD-10-CM | POA: Diagnosis not present

## 2020-09-04 DIAGNOSIS — D649 Anemia, unspecified: Secondary | ICD-10-CM | POA: Diagnosis not present

## 2020-09-04 DIAGNOSIS — E1143 Type 2 diabetes mellitus with diabetic autonomic (poly)neuropathy: Secondary | ICD-10-CM | POA: Diagnosis not present

## 2020-09-04 DIAGNOSIS — F32A Depression, unspecified: Secondary | ICD-10-CM | POA: Diagnosis not present

## 2020-09-09 DIAGNOSIS — M542 Cervicalgia: Secondary | ICD-10-CM | POA: Diagnosis not present

## 2020-09-09 DIAGNOSIS — Z79899 Other long term (current) drug therapy: Secondary | ICD-10-CM | POA: Diagnosis not present

## 2020-09-09 DIAGNOSIS — M545 Low back pain, unspecified: Secondary | ICD-10-CM | POA: Diagnosis not present

## 2020-09-09 DIAGNOSIS — M79602 Pain in left arm: Secondary | ICD-10-CM | POA: Diagnosis not present

## 2020-09-09 DIAGNOSIS — G8929 Other chronic pain: Secondary | ICD-10-CM | POA: Diagnosis not present

## 2020-09-10 ENCOUNTER — Other Ambulatory Visit: Payer: Self-pay | Admitting: Internal Medicine

## 2020-09-10 NOTE — Telephone Encounter (Signed)
Please refill as per office routine med refill policy (all routine meds refilled for 3 mo or monthly per pt preference up to one year from last visit, then month to month grace period for 3 mo, then further med refills will have to be denied)  

## 2020-09-11 DIAGNOSIS — Z7984 Long term (current) use of oral hypoglycemic drugs: Secondary | ICD-10-CM | POA: Diagnosis not present

## 2020-09-11 DIAGNOSIS — N390 Urinary tract infection, site not specified: Secondary | ICD-10-CM | POA: Diagnosis not present

## 2020-09-11 DIAGNOSIS — I10 Essential (primary) hypertension: Secondary | ICD-10-CM | POA: Diagnosis not present

## 2020-09-11 DIAGNOSIS — R42 Dizziness and giddiness: Secondary | ICD-10-CM | POA: Diagnosis not present

## 2020-09-11 DIAGNOSIS — E1143 Type 2 diabetes mellitus with diabetic autonomic (poly)neuropathy: Secondary | ICD-10-CM | POA: Diagnosis not present

## 2020-09-11 DIAGNOSIS — F32A Depression, unspecified: Secondary | ICD-10-CM | POA: Diagnosis not present

## 2020-09-11 DIAGNOSIS — K219 Gastro-esophageal reflux disease without esophagitis: Secondary | ICD-10-CM | POA: Diagnosis not present

## 2020-09-11 DIAGNOSIS — D649 Anemia, unspecified: Secondary | ICD-10-CM | POA: Diagnosis not present

## 2020-09-18 DIAGNOSIS — F32A Depression, unspecified: Secondary | ICD-10-CM | POA: Diagnosis not present

## 2020-09-18 DIAGNOSIS — E1143 Type 2 diabetes mellitus with diabetic autonomic (poly)neuropathy: Secondary | ICD-10-CM | POA: Diagnosis not present

## 2020-09-18 DIAGNOSIS — N390 Urinary tract infection, site not specified: Secondary | ICD-10-CM | POA: Diagnosis not present

## 2020-09-18 DIAGNOSIS — I10 Essential (primary) hypertension: Secondary | ICD-10-CM | POA: Diagnosis not present

## 2020-09-18 DIAGNOSIS — Z7984 Long term (current) use of oral hypoglycemic drugs: Secondary | ICD-10-CM | POA: Diagnosis not present

## 2020-09-18 DIAGNOSIS — R42 Dizziness and giddiness: Secondary | ICD-10-CM | POA: Diagnosis not present

## 2020-09-18 DIAGNOSIS — D649 Anemia, unspecified: Secondary | ICD-10-CM | POA: Diagnosis not present

## 2020-09-18 DIAGNOSIS — K219 Gastro-esophageal reflux disease without esophagitis: Secondary | ICD-10-CM | POA: Diagnosis not present

## 2020-09-20 ENCOUNTER — Other Ambulatory Visit: Payer: Self-pay | Admitting: Internal Medicine

## 2020-09-20 NOTE — Telephone Encounter (Signed)
Please refill as per office routine med refill policy (all routine meds refilled for 3 mo or monthly per pt preference up to one year from last visit, then month to month grace period for 3 mo, then further med refills will have to be denied)  

## 2020-09-21 ENCOUNTER — Other Ambulatory Visit: Payer: Self-pay | Admitting: Internal Medicine

## 2020-09-21 NOTE — Telephone Encounter (Signed)
Please refill as per office routine med refill policy (all routine meds refilled for 3 mo or monthly per pt preference up to one year from last visit, then month to month grace period for 3 mo, then further med refills will have to be denied)  

## 2020-09-25 DIAGNOSIS — D649 Anemia, unspecified: Secondary | ICD-10-CM | POA: Diagnosis not present

## 2020-09-25 DIAGNOSIS — F32A Depression, unspecified: Secondary | ICD-10-CM | POA: Diagnosis not present

## 2020-09-25 DIAGNOSIS — Z7984 Long term (current) use of oral hypoglycemic drugs: Secondary | ICD-10-CM | POA: Diagnosis not present

## 2020-09-25 DIAGNOSIS — N390 Urinary tract infection, site not specified: Secondary | ICD-10-CM | POA: Diagnosis not present

## 2020-09-25 DIAGNOSIS — E1143 Type 2 diabetes mellitus with diabetic autonomic (poly)neuropathy: Secondary | ICD-10-CM | POA: Diagnosis not present

## 2020-09-25 DIAGNOSIS — K219 Gastro-esophageal reflux disease without esophagitis: Secondary | ICD-10-CM | POA: Diagnosis not present

## 2020-09-25 DIAGNOSIS — I10 Essential (primary) hypertension: Secondary | ICD-10-CM | POA: Diagnosis not present

## 2020-09-25 DIAGNOSIS — R42 Dizziness and giddiness: Secondary | ICD-10-CM | POA: Diagnosis not present

## 2020-10-02 DIAGNOSIS — E1143 Type 2 diabetes mellitus with diabetic autonomic (poly)neuropathy: Secondary | ICD-10-CM | POA: Diagnosis not present

## 2020-10-02 DIAGNOSIS — F32A Depression, unspecified: Secondary | ICD-10-CM | POA: Diagnosis not present

## 2020-10-02 DIAGNOSIS — R42 Dizziness and giddiness: Secondary | ICD-10-CM | POA: Diagnosis not present

## 2020-10-02 DIAGNOSIS — D649 Anemia, unspecified: Secondary | ICD-10-CM | POA: Diagnosis not present

## 2020-10-02 DIAGNOSIS — I10 Essential (primary) hypertension: Secondary | ICD-10-CM | POA: Diagnosis not present

## 2020-10-02 DIAGNOSIS — K219 Gastro-esophageal reflux disease without esophagitis: Secondary | ICD-10-CM | POA: Diagnosis not present

## 2020-10-02 DIAGNOSIS — N390 Urinary tract infection, site not specified: Secondary | ICD-10-CM | POA: Diagnosis not present

## 2020-10-02 DIAGNOSIS — Z7984 Long term (current) use of oral hypoglycemic drugs: Secondary | ICD-10-CM | POA: Diagnosis not present

## 2020-10-08 DIAGNOSIS — Z79899 Other long term (current) drug therapy: Secondary | ICD-10-CM | POA: Diagnosis not present

## 2020-10-08 DIAGNOSIS — M79602 Pain in left arm: Secondary | ICD-10-CM | POA: Diagnosis not present

## 2020-10-08 DIAGNOSIS — M545 Low back pain, unspecified: Secondary | ICD-10-CM | POA: Diagnosis not present

## 2020-10-08 DIAGNOSIS — M542 Cervicalgia: Secondary | ICD-10-CM | POA: Diagnosis not present

## 2020-10-08 DIAGNOSIS — G8929 Other chronic pain: Secondary | ICD-10-CM | POA: Diagnosis not present

## 2020-10-09 DIAGNOSIS — D649 Anemia, unspecified: Secondary | ICD-10-CM | POA: Diagnosis not present

## 2020-10-09 DIAGNOSIS — R42 Dizziness and giddiness: Secondary | ICD-10-CM | POA: Diagnosis not present

## 2020-10-09 DIAGNOSIS — F32A Depression, unspecified: Secondary | ICD-10-CM | POA: Diagnosis not present

## 2020-10-09 DIAGNOSIS — N390 Urinary tract infection, site not specified: Secondary | ICD-10-CM | POA: Diagnosis not present

## 2020-10-09 DIAGNOSIS — Z7984 Long term (current) use of oral hypoglycemic drugs: Secondary | ICD-10-CM | POA: Diagnosis not present

## 2020-10-09 DIAGNOSIS — K219 Gastro-esophageal reflux disease without esophagitis: Secondary | ICD-10-CM | POA: Diagnosis not present

## 2020-10-09 DIAGNOSIS — I10 Essential (primary) hypertension: Secondary | ICD-10-CM | POA: Diagnosis not present

## 2020-10-09 DIAGNOSIS — E1143 Type 2 diabetes mellitus with diabetic autonomic (poly)neuropathy: Secondary | ICD-10-CM | POA: Diagnosis not present

## 2020-10-22 ENCOUNTER — Other Ambulatory Visit: Payer: Self-pay | Admitting: Internal Medicine

## 2020-10-22 MED ORDER — MECLIZINE HCL 12.5 MG PO TABS: 12.5000 mg | ORAL_TABLET | Freq: Four times a day (QID) | ORAL | 2 refills | Status: DC | PRN

## 2020-11-15 ENCOUNTER — Other Ambulatory Visit: Payer: Self-pay | Admitting: Internal Medicine

## 2020-11-15 NOTE — Telephone Encounter (Signed)
Please refill as per office routine med refill policy (all routine meds refilled for 3 mo or monthly per pt preference up to one year from last visit, then month to month grace period for 3 mo, then further med refills will have to be denied)  

## 2020-11-21 DIAGNOSIS — G8929 Other chronic pain: Secondary | ICD-10-CM | POA: Diagnosis not present

## 2020-11-21 DIAGNOSIS — M79602 Pain in left arm: Secondary | ICD-10-CM | POA: Diagnosis not present

## 2020-11-21 DIAGNOSIS — M542 Cervicalgia: Secondary | ICD-10-CM | POA: Diagnosis not present

## 2020-11-21 DIAGNOSIS — Z79899 Other long term (current) drug therapy: Secondary | ICD-10-CM | POA: Diagnosis not present

## 2020-11-21 DIAGNOSIS — M545 Low back pain, unspecified: Secondary | ICD-10-CM | POA: Diagnosis not present

## 2020-12-20 DIAGNOSIS — M79602 Pain in left arm: Secondary | ICD-10-CM | POA: Diagnosis not present

## 2020-12-20 DIAGNOSIS — G8929 Other chronic pain: Secondary | ICD-10-CM | POA: Diagnosis not present

## 2020-12-20 DIAGNOSIS — M545 Low back pain, unspecified: Secondary | ICD-10-CM | POA: Diagnosis not present

## 2020-12-20 DIAGNOSIS — M542 Cervicalgia: Secondary | ICD-10-CM | POA: Diagnosis not present

## 2020-12-20 DIAGNOSIS — Z79899 Other long term (current) drug therapy: Secondary | ICD-10-CM | POA: Diagnosis not present

## 2020-12-25 DIAGNOSIS — Z79899 Other long term (current) drug therapy: Secondary | ICD-10-CM | POA: Diagnosis not present

## 2020-12-27 ENCOUNTER — Other Ambulatory Visit: Payer: Self-pay | Admitting: Internal Medicine

## 2020-12-27 NOTE — Telephone Encounter (Signed)
Please refill as per office routine med refill policy (all routine meds refilled for 3 mo or monthly per pt preference up to one year from last visit, then month to month grace period for 3 mo, then further med refills will have to be denied)  

## 2021-01-17 DIAGNOSIS — M545 Low back pain, unspecified: Secondary | ICD-10-CM | POA: Diagnosis not present

## 2021-01-17 DIAGNOSIS — Z79899 Other long term (current) drug therapy: Secondary | ICD-10-CM | POA: Diagnosis not present

## 2021-01-17 DIAGNOSIS — G8929 Other chronic pain: Secondary | ICD-10-CM | POA: Diagnosis not present

## 2021-02-06 ENCOUNTER — Other Ambulatory Visit: Payer: Self-pay | Admitting: Internal Medicine

## 2021-02-06 NOTE — Telephone Encounter (Signed)
Please refill as per office routine med refill policy (all routine meds refilled for 3 mo or monthly per pt preference up to one year from last visit, then month to month grace period for 3 mo, then further med refills will have to be denied)  

## 2021-02-19 DIAGNOSIS — Z79899 Other long term (current) drug therapy: Secondary | ICD-10-CM | POA: Diagnosis not present

## 2021-02-19 DIAGNOSIS — G8929 Other chronic pain: Secondary | ICD-10-CM | POA: Diagnosis not present

## 2021-02-19 DIAGNOSIS — M545 Low back pain, unspecified: Secondary | ICD-10-CM | POA: Diagnosis not present

## 2021-03-03 ENCOUNTER — Other Ambulatory Visit: Payer: Self-pay | Admitting: Internal Medicine

## 2021-03-06 ENCOUNTER — Other Ambulatory Visit: Payer: Self-pay | Admitting: Internal Medicine

## 2021-03-06 NOTE — Telephone Encounter (Signed)
Please refill as per office routine med refill policy (all routine meds to be refilled for 3 mo or monthly (per pt preference) up to one year from last visit, then month to month grace period for 3 mo, then further med refills will have to be denied) ? ?

## 2021-03-11 ENCOUNTER — Other Ambulatory Visit: Payer: Self-pay | Admitting: Internal Medicine

## 2021-03-21 DIAGNOSIS — Z79899 Other long term (current) drug therapy: Secondary | ICD-10-CM | POA: Diagnosis not present

## 2021-03-21 DIAGNOSIS — M545 Low back pain, unspecified: Secondary | ICD-10-CM | POA: Diagnosis not present

## 2021-03-21 DIAGNOSIS — E119 Type 2 diabetes mellitus without complications: Secondary | ICD-10-CM | POA: Diagnosis not present

## 2021-03-21 DIAGNOSIS — G8929 Other chronic pain: Secondary | ICD-10-CM | POA: Diagnosis not present

## 2021-04-05 ENCOUNTER — Other Ambulatory Visit: Payer: Self-pay | Admitting: Internal Medicine

## 2021-04-05 NOTE — Telephone Encounter (Signed)
Please refill as per office routine med refill policy (all routine meds to be refilled for 3 mo or monthly (per pt preference) up to one year from last visit, then month to month grace period for 3 mo, then further med refills will have to be denied) ? ?

## 2021-04-06 ENCOUNTER — Other Ambulatory Visit: Payer: Self-pay | Admitting: Internal Medicine

## 2021-04-21 DIAGNOSIS — E119 Type 2 diabetes mellitus without complications: Secondary | ICD-10-CM | POA: Diagnosis not present

## 2021-04-21 DIAGNOSIS — Z23 Encounter for immunization: Secondary | ICD-10-CM | POA: Diagnosis not present

## 2021-04-21 DIAGNOSIS — G8929 Other chronic pain: Secondary | ICD-10-CM | POA: Diagnosis not present

## 2021-04-21 DIAGNOSIS — M545 Low back pain, unspecified: Secondary | ICD-10-CM | POA: Diagnosis not present

## 2021-04-21 DIAGNOSIS — Z79899 Other long term (current) drug therapy: Secondary | ICD-10-CM | POA: Diagnosis not present

## 2021-04-23 DIAGNOSIS — Z79899 Other long term (current) drug therapy: Secondary | ICD-10-CM | POA: Diagnosis not present

## 2021-05-21 DIAGNOSIS — M545 Low back pain, unspecified: Secondary | ICD-10-CM | POA: Diagnosis not present

## 2021-05-21 DIAGNOSIS — E1144 Type 2 diabetes mellitus with diabetic amyotrophy: Secondary | ICD-10-CM | POA: Diagnosis not present

## 2021-05-21 DIAGNOSIS — R0602 Shortness of breath: Secondary | ICD-10-CM | POA: Diagnosis not present

## 2021-05-21 DIAGNOSIS — Z79899 Other long term (current) drug therapy: Secondary | ICD-10-CM | POA: Diagnosis not present

## 2021-05-21 DIAGNOSIS — G8929 Other chronic pain: Secondary | ICD-10-CM | POA: Diagnosis not present

## 2021-05-23 DIAGNOSIS — Z79899 Other long term (current) drug therapy: Secondary | ICD-10-CM | POA: Diagnosis not present

## 2021-06-06 ENCOUNTER — Other Ambulatory Visit: Payer: Self-pay | Admitting: Internal Medicine

## 2021-06-19 DIAGNOSIS — Z79899 Other long term (current) drug therapy: Secondary | ICD-10-CM | POA: Diagnosis not present

## 2021-06-19 DIAGNOSIS — G8929 Other chronic pain: Secondary | ICD-10-CM | POA: Diagnosis not present

## 2021-06-19 DIAGNOSIS — M129 Arthropathy, unspecified: Secondary | ICD-10-CM | POA: Diagnosis not present

## 2021-06-19 DIAGNOSIS — R5383 Other fatigue: Secondary | ICD-10-CM | POA: Diagnosis not present

## 2021-06-19 DIAGNOSIS — E1165 Type 2 diabetes mellitus with hyperglycemia: Secondary | ICD-10-CM | POA: Diagnosis not present

## 2021-06-19 DIAGNOSIS — M545 Low back pain, unspecified: Secondary | ICD-10-CM | POA: Diagnosis not present

## 2021-06-19 DIAGNOSIS — E559 Vitamin D deficiency, unspecified: Secondary | ICD-10-CM | POA: Diagnosis not present

## 2021-06-19 DIAGNOSIS — E7800 Pure hypercholesterolemia, unspecified: Secondary | ICD-10-CM | POA: Diagnosis not present

## 2021-06-25 DIAGNOSIS — Z79899 Other long term (current) drug therapy: Secondary | ICD-10-CM | POA: Diagnosis not present

## 2021-07-10 ENCOUNTER — Other Ambulatory Visit: Payer: Self-pay | Admitting: Internal Medicine

## 2021-07-10 NOTE — Telephone Encounter (Signed)
Ok to contact pt -   Meds refilled for 1 mo, but plesae for ROV for further refills

## 2021-07-17 DIAGNOSIS — E1165 Type 2 diabetes mellitus with hyperglycemia: Secondary | ICD-10-CM | POA: Diagnosis not present

## 2021-07-17 DIAGNOSIS — G8929 Other chronic pain: Secondary | ICD-10-CM | POA: Diagnosis not present

## 2021-07-17 DIAGNOSIS — E1144 Type 2 diabetes mellitus with diabetic amyotrophy: Secondary | ICD-10-CM | POA: Diagnosis not present

## 2021-07-17 DIAGNOSIS — Z0000 Encounter for general adult medical examination without abnormal findings: Secondary | ICD-10-CM | POA: Diagnosis not present

## 2021-07-17 DIAGNOSIS — M545 Low back pain, unspecified: Secondary | ICD-10-CM | POA: Diagnosis not present

## 2021-07-17 DIAGNOSIS — Z79899 Other long term (current) drug therapy: Secondary | ICD-10-CM | POA: Diagnosis not present

## 2021-07-21 DIAGNOSIS — Z79899 Other long term (current) drug therapy: Secondary | ICD-10-CM | POA: Diagnosis not present

## 2021-07-30 ENCOUNTER — Encounter: Payer: Medicare Other | Admitting: Internal Medicine

## 2021-08-07 ENCOUNTER — Other Ambulatory Visit: Payer: Self-pay | Admitting: Internal Medicine

## 2021-08-07 NOTE — Telephone Encounter (Signed)
Spoke with patient's daughter and she will be contacting us back to schedule the appointment for patient.

## 2021-08-07 NOTE — Telephone Encounter (Signed)
Ok to let pt know  1 mo zyprexa done as refill  Please for ROV for further refills

## 2021-08-07 NOTE — Telephone Encounter (Signed)
Ok for 1 mo refill  Pt needs ROV

## 2021-08-07 NOTE — Telephone Encounter (Signed)
Please refill as per office routine med refill policy (all routine meds to be refilled for 3 mo or monthly (per pt preference) up to one year from last visit, then month to month grace period for 3 mo, then further med refills will have to be denied) ? ?

## 2021-08-14 DIAGNOSIS — Z79899 Other long term (current) drug therapy: Secondary | ICD-10-CM | POA: Diagnosis not present

## 2021-08-14 DIAGNOSIS — E119 Type 2 diabetes mellitus without complications: Secondary | ICD-10-CM | POA: Diagnosis not present

## 2021-08-14 DIAGNOSIS — E1165 Type 2 diabetes mellitus with hyperglycemia: Secondary | ICD-10-CM | POA: Diagnosis not present

## 2021-08-14 DIAGNOSIS — G8929 Other chronic pain: Secondary | ICD-10-CM | POA: Diagnosis not present

## 2021-08-14 DIAGNOSIS — M545 Low back pain, unspecified: Secondary | ICD-10-CM | POA: Diagnosis not present

## 2021-08-18 DIAGNOSIS — Z79899 Other long term (current) drug therapy: Secondary | ICD-10-CM | POA: Diagnosis not present

## 2021-08-29 ENCOUNTER — Encounter: Payer: Medicare Other | Admitting: Internal Medicine

## 2021-09-05 ENCOUNTER — Other Ambulatory Visit: Payer: Self-pay | Admitting: Internal Medicine

## 2021-09-05 NOTE — Telephone Encounter (Signed)
Ok to contact pt ? ?Zyprexa refilled x 1 mo - please for ROV for further refills ?

## 2021-09-05 NOTE — Telephone Encounter (Signed)
Ok for 1 mo refill ? ?Needs ROV ?

## 2021-09-11 DIAGNOSIS — Z79899 Other long term (current) drug therapy: Secondary | ICD-10-CM | POA: Diagnosis not present

## 2021-09-11 DIAGNOSIS — E1165 Type 2 diabetes mellitus with hyperglycemia: Secondary | ICD-10-CM | POA: Diagnosis not present

## 2021-09-11 DIAGNOSIS — M545 Low back pain, unspecified: Secondary | ICD-10-CM | POA: Diagnosis not present

## 2021-09-11 DIAGNOSIS — E119 Type 2 diabetes mellitus without complications: Secondary | ICD-10-CM | POA: Diagnosis not present

## 2021-09-11 DIAGNOSIS — G8929 Other chronic pain: Secondary | ICD-10-CM | POA: Diagnosis not present

## 2021-09-11 DIAGNOSIS — M79602 Pain in left arm: Secondary | ICD-10-CM | POA: Diagnosis not present

## 2021-09-11 DIAGNOSIS — M546 Pain in thoracic spine: Secondary | ICD-10-CM | POA: Diagnosis not present

## 2021-09-16 ENCOUNTER — Encounter: Payer: Medicare Other | Admitting: Internal Medicine

## 2021-09-16 DIAGNOSIS — Z79899 Other long term (current) drug therapy: Secondary | ICD-10-CM | POA: Diagnosis not present

## 2021-10-04 ENCOUNTER — Other Ambulatory Visit: Payer: Self-pay | Admitting: Internal Medicine

## 2021-10-04 NOTE — Telephone Encounter (Signed)
Please refill as per office routine med refill policy (all routine meds to be refilled for 3 mo or monthly (per pt preference) up to one year from last visit, then month to month grace period for 3 mo, then further med refills will have to be denied) ? ?

## 2021-10-07 ENCOUNTER — Other Ambulatory Visit: Payer: Self-pay | Admitting: Internal Medicine

## 2021-10-07 ENCOUNTER — Ambulatory Visit (INDEPENDENT_AMBULATORY_CARE_PROVIDER_SITE_OTHER): Payer: Medicare Other | Admitting: Internal Medicine

## 2021-10-07 VITALS — BP 148/78 | HR 64 | Temp 98.0°F | Ht 59.0 in | Wt 148.4 lb

## 2021-10-07 DIAGNOSIS — E785 Hyperlipidemia, unspecified: Secondary | ICD-10-CM

## 2021-10-07 DIAGNOSIS — F03A0 Unspecified dementia, mild, without behavioral disturbance, psychotic disturbance, mood disturbance, and anxiety: Secondary | ICD-10-CM

## 2021-10-07 DIAGNOSIS — Z1159 Encounter for screening for other viral diseases: Secondary | ICD-10-CM | POA: Diagnosis not present

## 2021-10-07 DIAGNOSIS — R8290 Unspecified abnormal findings in urine: Secondary | ICD-10-CM

## 2021-10-07 DIAGNOSIS — F32A Depression, unspecified: Secondary | ICD-10-CM

## 2021-10-07 DIAGNOSIS — E538 Deficiency of other specified B group vitamins: Secondary | ICD-10-CM

## 2021-10-07 DIAGNOSIS — E118 Type 2 diabetes mellitus with unspecified complications: Secondary | ICD-10-CM

## 2021-10-07 DIAGNOSIS — E559 Vitamin D deficiency, unspecified: Secondary | ICD-10-CM

## 2021-10-07 DIAGNOSIS — Z0001 Encounter for general adult medical examination with abnormal findings: Secondary | ICD-10-CM

## 2021-10-07 DIAGNOSIS — I10 Essential (primary) hypertension: Secondary | ICD-10-CM | POA: Diagnosis not present

## 2021-10-07 LAB — URINALYSIS, ROUTINE W REFLEX MICROSCOPIC
Bilirubin Urine: NEGATIVE
Hgb urine dipstick: NEGATIVE
Ketones, ur: NEGATIVE
Nitrite: POSITIVE — AB
RBC / HPF: NONE SEEN (ref 0–?)
Specific Gravity, Urine: 1.02 (ref 1.000–1.030)
Total Protein, Urine: NEGATIVE
Urine Glucose: NEGATIVE
Urobilinogen, UA: 1 (ref 0.0–1.0)
pH: 6 (ref 5.0–8.0)

## 2021-10-07 LAB — CBC WITH DIFFERENTIAL/PLATELET
Basophils Absolute: 0.1 10*3/uL (ref 0.0–0.1)
Basophils Relative: 1.1 % (ref 0.0–3.0)
Eosinophils Absolute: 0.1 10*3/uL (ref 0.0–0.7)
Eosinophils Relative: 1.7 % (ref 0.0–5.0)
HCT: 35.6 % — ABNORMAL LOW (ref 36.0–46.0)
Hemoglobin: 11.7 g/dL — ABNORMAL LOW (ref 12.0–15.0)
Lymphocytes Relative: 28.1 % (ref 12.0–46.0)
Lymphs Abs: 1.9 10*3/uL (ref 0.7–4.0)
MCHC: 32.9 g/dL (ref 30.0–36.0)
MCV: 88.9 fl (ref 78.0–100.0)
Monocytes Absolute: 0.5 10*3/uL (ref 0.1–1.0)
Monocytes Relative: 7.9 % (ref 3.0–12.0)
Neutro Abs: 4.3 10*3/uL (ref 1.4–7.7)
Neutrophils Relative %: 61.2 % (ref 43.0–77.0)
Platelets: 235 10*3/uL (ref 150.0–400.0)
RBC: 4 Mil/uL (ref 3.87–5.11)
RDW: 15.5 % (ref 11.5–15.5)
WBC: 6.9 10*3/uL (ref 4.0–10.5)

## 2021-10-07 LAB — LIPID PANEL
Cholesterol: 119 mg/dL (ref 0–200)
HDL: 69.6 mg/dL (ref >39.00)
LDL Cholesterol: 27 mg/dL (ref 0–99)
NonHDL: 48.92
Total CHOL/HDL Ratio: 2
Triglycerides: 110 mg/dL (ref 0.0–149.0)
VLDL: 22 mg/dL (ref 0.0–40.0)

## 2021-10-07 LAB — HEPATIC FUNCTION PANEL
ALT: 20 U/L (ref 0–35)
AST: 20 U/L (ref 0–37)
Albumin: 4.5 g/dL (ref 3.5–5.2)
Alkaline Phosphatase: 61 U/L (ref 39–117)
Bilirubin, Direct: 0.1 mg/dL (ref 0.0–0.3)
Total Bilirubin: 0.7 mg/dL (ref 0.2–1.2)
Total Protein: 7.5 g/dL (ref 6.0–8.3)

## 2021-10-07 LAB — BASIC METABOLIC PANEL
BUN: 20 mg/dL (ref 6–23)
CO2: 32 mEq/L (ref 19–32)
Calcium: 10.1 mg/dL (ref 8.4–10.5)
Chloride: 100 mEq/L (ref 96–112)
Creatinine, Ser: 0.98 mg/dL (ref 0.40–1.20)
GFR: 54.76 mL/min — ABNORMAL LOW (ref >60.00)
Glucose, Bld: 89 mg/dL (ref 70–99)
Potassium: 4.1 mEq/L (ref 3.5–5.1)
Sodium: 140 mEq/L (ref 135–145)

## 2021-10-07 LAB — MICROALBUMIN / CREATININE URINE RATIO
Creatinine,U: 83.9 mg/dL
Microalb Creat Ratio: 9.7 mg/g (ref 0.0–30.0)
Microalb, Ur: 8.1 mg/dL — ABNORMAL HIGH (ref 0.0–1.9)

## 2021-10-07 LAB — VITAMIN B12: Vitamin B-12: 547 pg/mL (ref 211–911)

## 2021-10-07 LAB — TSH: TSH: 2.45 u[IU]/mL (ref 0.35–5.50)

## 2021-10-07 LAB — HEMOGLOBIN A1C: Hgb A1c MFr Bld: 6.4 % (ref 4.6–6.5)

## 2021-10-07 NOTE — Progress Notes (Signed)
Patient ID: Latasha Wang, female   DOB: [birth year], 81 y.o.   MRN: [id number] ? ? ?     Chief Complaint:: wellness exam and dm, hld, htn, depression ? ?     HPI:  Latasha Wang is a 81 y.o. female here for wellness exam; declines covid booster, shingrix, o/w up to date ?         ?              Also states BP at home < 140/90.  Pt denies chest pain, increased sob or doe, wheezing, orthopnea, PND, increased LE swelling, palpitations, dizziness or syncope.   Pt denies polydipsia, polyuria, or new focal neuro s/s.    Pt denies fever, wt loss, night sweats, loss of appetite, or other constitutional symptoms Denies worsening depressive symptoms, suicidal ideation, or panic.  Dementia overall stable symptomatically, and not assoc with behavioral changes such as hallucinations, paranoia, or agitation.  Denies urinary symptoms such as dysuria, urgency, flank pain, hematuria or n/v, fever, chills, except mild frequency in the past wek. ?  ?Wt Readings from Last 3 Encounters:  ?10/07/21 148 lb 6.4 oz (67.3 kg)  ?07/24/20 142 lb (64.4 kg)  ?06/19/19 163 lb (73.9 kg)  ? ?BP Readings from Last 3 Encounters:  ?10/07/21 (!) 148/78  ?07/24/20 (!) 162/80  ?06/19/19 124/76  ? ?Immunization History  ?Administered Date(s) Administered  ? Influenza Whole 05/06/2007, 03/26/2008  ? Influenza, High Dose Seasonal PF 06/09/2016, 06/10/2017, 06/13/2018  ? Influenza-Unspecified 03/22/2014  ? PPD Test 03/18/2016  ? Pneumococcal Conjugate-13 06/13/2013, 12/12/2013  ? Pneumococcal Polysaccharide-23 12/13/2006  ? Td 12/17/2008  ? Tdap 12/19/2018  ? ?There are no preventive care reminders to display for this patient. ? ?  ? ?Past Medical History:  ?Diagnosis Date  ? ANXIETY   ? Dementia ([place]) 07/24/2020  ? DEPRESSION   ? Diabetes mellitus   ? Essential hypertension 05/09/2007  ? Qualifier: Diagnosis of  By: Wynona Luna   ? FATTY LIVER DISEASE   ? Gastroparesis   ? GERD   ? HYPERLIPIDEMIA   ? HYPERTENSION   ? LATERAL EPICONDYLITIS, RIGHT    ? ?Past Surgical History:  ?Procedure Laterality Date  ? CATARACT EXTRACTION, BILATERAL    ? TOTAL ELBOW ARTHROPLASTY  02/19/2012  ? Procedure: TOTAL ELBOW ARTHROPLASTY;  Surgeon: Rozanna Box, MD;  Location: [facility] [place];  Service: Orthopedics;  Laterality: Left;  ? ? reports that she has never smoked. She has never used smokeless tobacco. She reports that she does not drink alcohol and does not use drugs. ?family history is not on file. ?No Known Allergies ?Current Outpatient Medications on File Prior to Visit  ?Medication Sig Dispense Refill  ? acetaminophen (TYLENOL) 500 MG tablet Take 1-2 tablets (500-1,000 mg total) by mouth every 6 (six) hours as needed for pain or fever. For pain 90 tablet 0  ? albuterol (PROVENTIL HFA;VENTOLIN HFA) 108 (90 BASE) MCG/ACT inhaler Inhale 2 puffs into the lungs every 6 (six) hours as needed for wheezing. 1 Inhaler 0  ? alendronate (FOSAMAX) 70 MG tablet TAKE 1 TABLET BY MOUTH EVEYR 7 DAYS WITH FULL GLASS OF WATER ON EMPTY STOMACH 12 tablet 0  ? benzonatate (TESSALON) 100 MG capsule Take 1 capsule (100 mg total) by mouth 3 (three) times daily as needed. 30 capsule 0  ? buPROPion (WELLBUTRIN XL) 300 MG 24 hr tablet TAKE 1 TABLET BY MOUTH EVERY DAY 90 tablet 0  ? chlorhexidine (PERIDEX) 0.12 % solution  Use as directed 30 mLs in the mouth or throat 3 (three) times daily.    ? clonazePAM (KLONOPIN) 0.5 MG tablet TAKE 1 TABLET BY MOUTH TWICE A DAY AS NEEDED 60 tablet 5  ? HYDROcodone-acetaminophen (NORCO) 10-325 MG tablet Take 1 tablet by mouth 5 (five) times daily.    ? irbesartan (AVAPRO) 300 MG tablet TAKE 1 TABLET (300 MG TOTAL) BY MOUTH AT BEDTIME. 90 tablet 1  ? ketoconazole (NIZORAL) 2 % shampoo Apply 1 application topically every Friday.  4  ? meclizine (ANTIVERT) 12.5 MG tablet Take 1-2 tablets (12.5-25 mg total) by mouth every 6 (six) hours as needed. 30 tablet 2  ? NARCAN 4 MG/0.1ML LIQD nasal spray kit   0  ? OLANZapine (ZYPREXA) 5 MG tablet TAKE 1 TABLET (5 MG TOTAL) BY  MOUTH DAILY. 30 tablet 0  ? potassium chloride (KLOR-CON) 10 MEQ tablet Take 1 tablet (10 mEq total) by mouth daily for 5 days. 5 tablet 0  ? ?No current facility-administered medications on file prior to visit.  ? ?     ROS:  All others reviewed and negative. ? ?Objective  ? ?     PE:  BP (!) 148/78 (BP Location: Right Arm, Patient Position: Sitting, Cuff Size: Large)   Pulse 64   Temp 98 ?F (36.7 ?C) (Oral)   Ht 4' 11" (1.499 m)   Wt 148 lb 6.4 oz (67.3 kg)   SpO2 97%   BMI 29.97 kg/m?  ? ?              Constitutional: Pt appears in NAD ?              HENT: Head: NCAT.  ?              Right Ear: External ear normal.   ?              Left Ear: External ear normal.  ?              Eyes: . Pupils are equal, round, and reactive to light. Conjunctivae and EOM are normal ?              Nose: without d/c or deformity ?              Neck: Neck supple. Gross normal ROM ?              Cardiovascular: Normal rate and regular rhythm.   ?              Pulmonary/Chest: Effort normal and breath sounds without rales or wheezing.  ?              Abd:  Soft, NT, ND, + BS, no organomegaly ?              Neurological: Pt is alert. At baseline orientation, motor grossly intact ?              Skin: Skin is warm. No rashes, no other new lesions, LE edema - none ?              Psychiatric: Pt behavior is normal without agitation  ? ?Micro: none ? ?Cardiac tracings I have personally interpreted today:  none ? ?Pertinent Radiological findings (summarize): none  ? ?Lab Results  ?Component Value Date  ? WBC 6.9 10/07/2021  ? HGB 11.7 (L) 10/07/2021  ? HCT 35.6 (L) 10/07/2021  ? PLT 235.0 10/07/2021  ? GLUCOSE 89 10/07/2021  ?  CHOL 119 10/07/2021  ? TRIG 110.0 10/07/2021  ? HDL 69.60 10/07/2021  ? LDLDIRECT 85.1 08/19/2006  ? [place] 27 10/07/2021  ? ALT 20 10/07/2021  ? AST 20 10/07/2021  ? NA 140 10/07/2021  ? K 4.1 10/07/2021  ? CL 100 10/07/2021  ? CREATININE 0.98 10/07/2021  ? BUN 20 10/07/2021  ? CO2 32 10/07/2021  ? TSH 2.45  10/07/2021  ? INR 0.91 02/19/2012  ? HGBA1C 6.4 10/07/2021  ? MICROALBUR 8.1 (H) 10/07/2021  ? ?Assessment/Plan:  ?JERRIS KELTZ is a 81 y.o. Asian [4] female with  has a past medical history of ANXIETY, Dementia ([place]) (07/24/2020), DEPRESSION, Diabetes mellitus, Essential hypertension (05/09/2007), FATTY LIVER DISEASE, Gastroparesis, GERD, HYPERLIPIDEMIA, HYPERTENSION, and LATERAL EPICONDYLITIS, RIGHT. ? ?Encounter for well adult exam with abnormal findings ?Age and sex appropriate education and counseling updated with regular exercise and diet ?Referrals for preventative services - none needed ?Immunizations addressed - declines covid booster, shingrix ?Smoking counseling  - none needed ?Evidence for depression or other mood disorder - chronic stable depression ?Most recent labs reviewed. ?I have personally reviewed and have noted: ?1) the patient's medical and social history ?2) The patient's current medications and supplements ?3) The patient's height, weight, and BMI have been recorded in the chart ? ? ?Dementia ([place]) ?Chronic stable, cont current tx - zyprexa ? ?Hyperlipidemia ?Lab Results  ?Component Value Date  ? [place] 27 10/07/2021  ? ?Stable, pt to continue current statin  - diet ? ? ?Essential hypertension ?BP Readings from Last 3 Encounters:  ?10/07/21 (!) 148/78  ?07/24/20 (!) 162/80  ?06/19/19 124/76  ? ?Uncontrolled but pt states controlled at home, pt to continue medical treatment norvasc, hct,  avapro as declines change ? ? ?Depression ?Stable, cont prozac, declines need for change or referral for counseling ? ?Controlled diabetes mellitus type 2 with complications ([place]) ?Lab Results  ?Component Value Date  ? HGBA1C 6.4 10/07/2021  ? ?Stable, pt to continue current medical treatment metformin, actos ? ? ?Abnormal urinalysis ?C/w probable uti - Mild to mod, for antibx course,  to f/u any worsening symptoms or concerns ? ?Followup: Return in about 1 year (around 10/08/2022). ? ?Cathlean Cower, MD  10/11/2021 5:22 PM ?[facility] ?[place] ?Internal Medicine ?

## 2021-10-07 NOTE — Patient Instructions (Signed)

## 2021-10-07 NOTE — Telephone Encounter (Signed)
Please refill as per office routine med refill policy (all routine meds to be refilled for 3 mo or monthly (per pt preference) up to one year from last visit, then month to month grace period for 3 mo, then further med refills will have to be denied) ? ?

## 2021-10-08 ENCOUNTER — Encounter: Payer: Self-pay | Admitting: Internal Medicine

## 2021-10-08 ENCOUNTER — Telehealth: Payer: Self-pay

## 2021-10-08 ENCOUNTER — Other Ambulatory Visit: Payer: Self-pay | Admitting: Internal Medicine

## 2021-10-08 LAB — VITAMIN D 25 HYDROXY (VIT D DEFICIENCY, FRACTURES): VITD: 113.01 ng/mL (ref 30.00–100.00)

## 2021-10-08 LAB — HEPATITIS C ANTIBODY
Hepatitis C Ab: NONREACTIVE
SIGNAL TO CUT-OFF: 0.05 (ref <1.00)

## 2021-10-08 MED ORDER — CEPHALEXIN 500 MG PO CAPS: 500.0000 mg | ORAL_CAPSULE | Freq: Three times a day (TID) | ORAL | 0 refills | Status: DC

## 2021-10-08 NOTE — Telephone Encounter (Signed)
CRITICAL VALUE STICKER ? ?CRITICAL VALUE: high Vitamin D level at 113.01 ? ?RECEIVER (on-site recipient of call): Jarrett Soho  ? ?DATE & TIME NOTIFIED: 10/08/21 at 8:12 am ? ?MESSENGER (representative from lab): Lowry Ram ? ?MD NOTIFIED: Dr. Jenny Reichmann ? ?TIME OF NOTIFICATION: 8:13 am ? ? ?

## 2021-10-09 DIAGNOSIS — E119 Type 2 diabetes mellitus without complications: Secondary | ICD-10-CM | POA: Diagnosis not present

## 2021-10-09 DIAGNOSIS — Z79899 Other long term (current) drug therapy: Secondary | ICD-10-CM | POA: Diagnosis not present

## 2021-10-09 DIAGNOSIS — E1165 Type 2 diabetes mellitus with hyperglycemia: Secondary | ICD-10-CM | POA: Diagnosis not present

## 2021-10-09 DIAGNOSIS — M545 Low back pain, unspecified: Secondary | ICD-10-CM | POA: Diagnosis not present

## 2021-10-09 DIAGNOSIS — G8929 Other chronic pain: Secondary | ICD-10-CM | POA: Diagnosis not present

## 2021-10-10 ENCOUNTER — Other Ambulatory Visit: Payer: Self-pay | Admitting: Internal Medicine

## 2021-10-11 ENCOUNTER — Encounter: Payer: Self-pay | Admitting: Internal Medicine

## 2021-10-11 DIAGNOSIS — R8290 Unspecified abnormal findings in urine: Secondary | ICD-10-CM | POA: Insufficient documentation

## 2021-10-11 NOTE — Assessment & Plan Note (Signed)
C/w probable uti - Mild to mod, for antibx course,  to f/u any worsening symptoms or concerns ?

## 2021-10-11 NOTE — Assessment & Plan Note (Signed)
BP Readings from Last 3 Encounters:  ?10/07/21 (!) 148/78  ?07/24/20 (!) 162/80  ?06/19/19 124/76  ? ?Uncontrolled but pt states controlled at home, pt to continue medical treatment norvasc, hct,  avapro as declines change ? ?

## 2021-10-11 NOTE — Assessment & Plan Note (Signed)
Stable, cont prozac, declines need for change or referral for counseling ?

## 2021-10-11 NOTE — Assessment & Plan Note (Signed)
Lab Results  ?Component Value Date  ? HGBA1C 6.4 10/07/2021  ? ?Stable, pt to continue current medical treatment metformin, actos ? ?

## 2021-10-11 NOTE — Assessment & Plan Note (Signed)
Lab Results  ?Component Value Date  ? [place] 27 10/07/2021  ? ?Stable, pt to continue current statin  - diet ? ?

## 2021-10-11 NOTE — Assessment & Plan Note (Signed)
Age and sex appropriate education and counseling updated with regular exercise and diet ?Referrals for preventative services - none needed ?Immunizations addressed - declines covid booster, shingrix ?Smoking counseling  - none needed ?Evidence for depression or other mood disorder - chronic stable depression ?Most recent labs reviewed. ?I have personally reviewed and have noted: ?1) the patient's medical and social history ?2) The patient's current medications and supplements ?3) The patient's height, weight, and BMI have been recorded in the chart ? ?

## 2021-10-11 NOTE — Assessment & Plan Note (Signed)
Chronic stable, cont current tx - zyprexa ?

## 2021-10-12 ENCOUNTER — Other Ambulatory Visit: Payer: Self-pay | Admitting: Internal Medicine

## 2021-10-13 DIAGNOSIS — Z79899 Other long term (current) drug therapy: Secondary | ICD-10-CM | POA: Diagnosis not present

## 2021-11-02 ENCOUNTER — Other Ambulatory Visit: Payer: Self-pay | Admitting: Internal Medicine

## 2021-11-06 DIAGNOSIS — G8929 Other chronic pain: Secondary | ICD-10-CM | POA: Diagnosis not present

## 2021-11-06 DIAGNOSIS — M545 Low back pain, unspecified: Secondary | ICD-10-CM | POA: Diagnosis not present

## 2021-11-06 DIAGNOSIS — E119 Type 2 diabetes mellitus without complications: Secondary | ICD-10-CM | POA: Diagnosis not present

## 2021-11-06 DIAGNOSIS — E1165 Type 2 diabetes mellitus with hyperglycemia: Secondary | ICD-10-CM | POA: Diagnosis not present

## 2021-11-06 DIAGNOSIS — Z79899 Other long term (current) drug therapy: Secondary | ICD-10-CM | POA: Diagnosis not present

## 2021-11-11 DIAGNOSIS — Z79899 Other long term (current) drug therapy: Secondary | ICD-10-CM | POA: Diagnosis not present

## 2021-11-19 ENCOUNTER — Other Ambulatory Visit: Payer: Self-pay | Admitting: Internal Medicine

## 2021-12-04 DIAGNOSIS — M545 Low back pain, unspecified: Secondary | ICD-10-CM | POA: Diagnosis not present

## 2021-12-04 DIAGNOSIS — G8929 Other chronic pain: Secondary | ICD-10-CM | POA: Diagnosis not present

## 2021-12-04 DIAGNOSIS — E1165 Type 2 diabetes mellitus with hyperglycemia: Secondary | ICD-10-CM | POA: Diagnosis not present

## 2021-12-04 DIAGNOSIS — Z79899 Other long term (current) drug therapy: Secondary | ICD-10-CM | POA: Diagnosis not present

## 2021-12-04 DIAGNOSIS — E119 Type 2 diabetes mellitus without complications: Secondary | ICD-10-CM | POA: Diagnosis not present

## 2021-12-06 DIAGNOSIS — Z79899 Other long term (current) drug therapy: Secondary | ICD-10-CM | POA: Diagnosis not present

## 2021-12-11 ENCOUNTER — Ambulatory Visit (INDEPENDENT_AMBULATORY_CARE_PROVIDER_SITE_OTHER): Payer: Medicare Other

## 2021-12-11 DIAGNOSIS — Z0000 Encounter for general adult medical examination without abnormal findings: Secondary | ICD-10-CM

## 2021-12-11 NOTE — Patient Instructions (Signed)
Ms. Latasha Wang , Thank you for taking time to come for your Medicare Wellness Visit. I appreciate your ongoing commitment to your health goals. Please review the following plan we discussed and let me know if I can assist you in the future.   Screening recommendations/referrals: Colonoscopy: Not a candidate for screening due to age Mammogram: Not a candidate for screening due to age Bone Density: last done 12/22/2016 Recommended yearly ophthalmology/optometry visit for glaucoma screening and checkup Recommended yearly dental visit for hygiene and checkup  Vaccinations: Influenza vaccine: 04/21/2021 Pneumococcal vaccine: 12/13/2006, 12/12/2013 Tdap vaccine: 12/19/2018; due every 10 years Shingles vaccine: never done   Covid-19: 03/01/2021, 03/22/2021  Advanced directives: MOST form on file  Conditions/risks identified: Yes  Next appointment: Please schedule your next Medicare Wellness Visit with your Nurse Health Advisor in 1 year by calling [phone].   Preventive Care 44 Years and Older, Female Preventive care refers to lifestyle choices and visits with your health care provider that can promote health and wellness. What does preventive care include? A yearly physical exam. This is also called an annual well check. Dental exams once or twice a year. Routine eye exams. Ask your health care provider how often you should have your eyes checked. Personal lifestyle choices, including: Daily care of your teeth and gums. Regular physical activity. Eating a healthy diet. Avoiding tobacco and drug use. Limiting alcohol use. Practicing safe sex. Taking low-dose aspirin every day. Taking vitamin and mineral supplements as recommended by your health care provider. What happens during an annual well check? The services and screenings done by your health care provider during your annual well check will depend on your age, overall health, lifestyle risk factors, and family history of  disease. Counseling  Your health care provider may ask you questions about your: Alcohol use. Tobacco use. Drug use. Emotional well-being. Home and relationship well-being. Sexual activity. Eating habits. History of falls. Memory and ability to understand (cognition). Work and work Astronomer. Reproductive health. Screening  You may have the following tests or measurements: Height, weight, and BMI. Blood pressure. Lipid and cholesterol levels. These may be checked every 5 years, or more frequently if you are over 54 years old. Skin check. Lung cancer screening. You may have this screening every year starting at age 29 if you have a 30-pack-year history of smoking and currently smoke or have quit within the past 15 years. Fecal occult blood test (FOBT) of the stool. You may have this test every year starting at age 55. Flexible sigmoidoscopy or colonoscopy. You may have a sigmoidoscopy every 5 years or a colonoscopy every 10 years starting at age 17. Hepatitis C blood test. Hepatitis B blood test. Sexually transmitted disease (STD) testing. Diabetes screening. This is done by checking your blood sugar (glucose) after you have not eaten for a while (fasting). You may have this done every 1-3 years. Bone density scan. This is done to screen for osteoporosis. You may have this done starting at age 16. Mammogram. This may be done every 1-2 years. Talk to your health care provider about how often you should have regular mammograms. Talk with your health care provider about your test results, treatment options, and if necessary, the need for more tests. Vaccines  Your health care provider may recommend certain vaccines, such as: Influenza vaccine. This is recommended every year. Tetanus, diphtheria, and acellular pertussis (Tdap, Td) vaccine. You may need a Td booster every 10 years. Zoster vaccine. You may need this after age 49. Pneumococcal  13-valent conjugate (PCV13) vaccine. One  dose is recommended after age 76. Pneumococcal polysaccharide (PPSV23) vaccine. One dose is recommended after age 58. Talk to your health care provider about which screenings and vaccines you need and how often you need them. This information is not intended to replace advice given to you by your health care provider. Make sure you discuss any questions you have with your health care provider. Document Released: 07/05/2015 Document Revised: 02/26/2016 Document Reviewed: 04/09/2015 Elsevier Interactive Patient Education  2017 [facility] [employer] [facility] Prevention in the Home Falls can cause injuries. They can happen to people of all ages. There are many things you can do to make your home safe and to help prevent falls. What can I do on the outside of my home? Regularly fix the edges of walkways and driveways and fix any cracks. Remove anything that might make you trip as you walk through a door, such as a raised step or threshold. Trim any bushes or trees on the path to your home. Use bright outdoor lighting. Clear any walking paths of anything that might make someone trip, such as rocks or tools. Regularly check to see if handrails are loose or broken. Make sure that both sides of any steps have handrails. Any raised decks and porches should have guardrails on the edges. Have any leaves, snow, or ice cleared regularly. Use sand or salt on walking paths during winter. Clean up any spills in your garage right away. This includes oil or grease spills. What can I do in the bathroom? Use night lights. Install grab bars by the toilet and in the tub and shower. Do not use towel bars as grab bars. Use non-skid mats or decals in the tub or shower. If you need to sit down in the shower, use a plastic, non-slip stool. Keep the floor dry. Clean up any water that spills on the floor as soon as it happens. Remove soap buildup in the tub or shower regularly. Attach bath mats securely with double-sided  non-slip rug tape. Do not have throw rugs and other things on the floor that can make you trip. What can I do in the bedroom? Use night lights. Make sure that you have a light by your bed that is easy to reach. Do not use any sheets or blankets that are too big for your bed. They should not hang down onto the floor. Have a firm chair that has side arms. You can use this for support while you get dressed. Do not have throw rugs and other things on the floor that can make you trip. What can I do in the kitchen? Clean up any spills right away. Avoid walking on wet floors. Keep items that you use a lot in easy-to-reach places. If you need to reach something above you, use a strong step stool that has a grab bar. Keep electrical cords out of the way. Do not use floor polish or wax that makes floors slippery. If you must use wax, use non-skid floor wax. Do not have throw rugs and other things on the floor that can make you trip. What can I do with my stairs? Do not leave any items on the stairs. Make sure that there are handrails on both sides of the stairs and use them. Fix handrails that are broken or loose. Make sure that handrails are as long as the stairways. Check any carpeting to make sure that it is firmly attached to the stairs. Fix any carpet  that is loose or worn. Avoid having throw rugs at the top or bottom of the stairs. If you do have throw rugs, attach them to the floor with carpet tape. Make sure that you have a light switch at the top of the stairs and the bottom of the stairs. If you do not have them, ask someone to add them for you. What else can I do to help prevent falls? Wear shoes that: Do not have high heels. Have rubber bottoms. Are comfortable and fit you well. Are closed at the toe. Do not wear sandals. If you use a stepladder: Make sure that it is fully opened. Do not climb a closed stepladder. Make sure that both sides of the stepladder are locked into place. Ask  someone to hold it for you, if possible. Clearly mark and make sure that you can see: Any grab bars or handrails. First and last steps. Where the edge of each step is. Use tools that help you move around (mobility aids) if they are needed. These include: Canes. Walkers. Scooters. Crutches. Turn on the lights when you go into a dark area. Replace any light bulbs as soon as they burn out. Set up your furniture so you have a clear path. Avoid moving your furniture around. If any of your floors are uneven, fix them. If there are any pets around you, be aware of where they are. Review your medicines with your doctor. Some medicines can make you feel dizzy. This can increase your chance of falling. Ask your doctor what other things that you can do to help prevent falls. This information is not intended to replace advice given to you by your health care provider. Make sure you discuss any questions you have with your health care provider. Document Released: 04/04/2009 Document Revised: 11/14/2015 Document Reviewed: 07/13/2014 Elsevier Interactive Patient Education  2017 [employer].

## 2022-01-01 DIAGNOSIS — M546 Pain in thoracic spine: Secondary | ICD-10-CM | POA: Diagnosis not present

## 2022-01-01 DIAGNOSIS — N1831 Chronic kidney disease, stage 3a: Secondary | ICD-10-CM | POA: Diagnosis not present

## 2022-01-01 DIAGNOSIS — G8929 Other chronic pain: Secondary | ICD-10-CM | POA: Diagnosis not present

## 2022-01-01 DIAGNOSIS — M79602 Pain in left arm: Secondary | ICD-10-CM | POA: Diagnosis not present

## 2022-01-01 DIAGNOSIS — M545 Low back pain, unspecified: Secondary | ICD-10-CM | POA: Diagnosis not present

## 2022-01-01 DIAGNOSIS — E1165 Type 2 diabetes mellitus with hyperglycemia: Secondary | ICD-10-CM | POA: Diagnosis not present

## 2022-01-01 DIAGNOSIS — Z79899 Other long term (current) drug therapy: Secondary | ICD-10-CM | POA: Diagnosis not present

## 2022-01-05 DIAGNOSIS — Z79899 Other long term (current) drug therapy: Secondary | ICD-10-CM | POA: Diagnosis not present

## 2022-02-02 DIAGNOSIS — E1165 Type 2 diabetes mellitus with hyperglycemia: Secondary | ICD-10-CM | POA: Diagnosis not present

## 2022-02-02 DIAGNOSIS — Z79899 Other long term (current) drug therapy: Secondary | ICD-10-CM | POA: Diagnosis not present

## 2022-02-02 DIAGNOSIS — M545 Low back pain, unspecified: Secondary | ICD-10-CM | POA: Diagnosis not present

## 2022-02-02 DIAGNOSIS — R030 Elevated blood-pressure reading, without diagnosis of hypertension: Secondary | ICD-10-CM | POA: Diagnosis not present

## 2022-02-02 DIAGNOSIS — G8929 Other chronic pain: Secondary | ICD-10-CM | POA: Diagnosis not present

## 2022-02-05 DIAGNOSIS — Z79899 Other long term (current) drug therapy: Secondary | ICD-10-CM | POA: Diagnosis not present

## 2022-03-03 DIAGNOSIS — E1165 Type 2 diabetes mellitus with hyperglycemia: Secondary | ICD-10-CM | POA: Diagnosis not present

## 2022-03-03 DIAGNOSIS — Z79899 Other long term (current) drug therapy: Secondary | ICD-10-CM | POA: Diagnosis not present

## 2022-03-03 DIAGNOSIS — G8929 Other chronic pain: Secondary | ICD-10-CM | POA: Diagnosis not present

## 2022-03-03 DIAGNOSIS — M545 Low back pain, unspecified: Secondary | ICD-10-CM | POA: Diagnosis not present

## 2022-03-10 DIAGNOSIS — Z79899 Other long term (current) drug therapy: Secondary | ICD-10-CM | POA: Diagnosis not present

## 2022-03-31 DIAGNOSIS — E119 Type 2 diabetes mellitus without complications: Secondary | ICD-10-CM | POA: Diagnosis not present

## 2022-03-31 DIAGNOSIS — Z79899 Other long term (current) drug therapy: Secondary | ICD-10-CM | POA: Diagnosis not present

## 2022-03-31 DIAGNOSIS — R7309 Other abnormal glucose: Secondary | ICD-10-CM | POA: Diagnosis not present

## 2022-03-31 DIAGNOSIS — M545 Low back pain, unspecified: Secondary | ICD-10-CM | POA: Diagnosis not present

## 2022-03-31 DIAGNOSIS — G8929 Other chronic pain: Secondary | ICD-10-CM | POA: Diagnosis not present

## 2022-03-31 DIAGNOSIS — R030 Elevated blood-pressure reading, without diagnosis of hypertension: Secondary | ICD-10-CM | POA: Diagnosis not present

## 2022-04-02 DIAGNOSIS — Z79899 Other long term (current) drug therapy: Secondary | ICD-10-CM | POA: Diagnosis not present

## 2022-04-30 DIAGNOSIS — R030 Elevated blood-pressure reading, without diagnosis of hypertension: Secondary | ICD-10-CM | POA: Diagnosis not present

## 2022-04-30 DIAGNOSIS — M545 Low back pain, unspecified: Secondary | ICD-10-CM | POA: Diagnosis not present

## 2022-04-30 DIAGNOSIS — R7309 Other abnormal glucose: Secondary | ICD-10-CM | POA: Diagnosis not present

## 2022-04-30 DIAGNOSIS — Z79899 Other long term (current) drug therapy: Secondary | ICD-10-CM | POA: Diagnosis not present

## 2022-04-30 DIAGNOSIS — G8929 Other chronic pain: Secondary | ICD-10-CM | POA: Diagnosis not present

## 2022-04-30 DIAGNOSIS — E119 Type 2 diabetes mellitus without complications: Secondary | ICD-10-CM | POA: Diagnosis not present

## 2022-05-05 DIAGNOSIS — Z79899 Other long term (current) drug therapy: Secondary | ICD-10-CM | POA: Diagnosis not present

## 2022-05-29 DIAGNOSIS — G8929 Other chronic pain: Secondary | ICD-10-CM | POA: Diagnosis not present

## 2022-05-29 DIAGNOSIS — R030 Elevated blood-pressure reading, without diagnosis of hypertension: Secondary | ICD-10-CM | POA: Diagnosis not present

## 2022-05-29 DIAGNOSIS — E119 Type 2 diabetes mellitus without complications: Secondary | ICD-10-CM | POA: Diagnosis not present

## 2022-05-29 DIAGNOSIS — R7309 Other abnormal glucose: Secondary | ICD-10-CM | POA: Diagnosis not present

## 2022-05-29 DIAGNOSIS — Z79899 Other long term (current) drug therapy: Secondary | ICD-10-CM | POA: Diagnosis not present

## 2022-05-29 DIAGNOSIS — M545 Low back pain, unspecified: Secondary | ICD-10-CM | POA: Diagnosis not present

## 2022-06-03 DIAGNOSIS — Z79899 Other long term (current) drug therapy: Secondary | ICD-10-CM | POA: Diagnosis not present

## 2022-06-29 DIAGNOSIS — E1140 Type 2 diabetes mellitus with diabetic neuropathy, unspecified: Secondary | ICD-10-CM | POA: Diagnosis not present

## 2022-06-29 DIAGNOSIS — N1831 Chronic kidney disease, stage 3a: Secondary | ICD-10-CM | POA: Diagnosis not present

## 2022-06-29 DIAGNOSIS — Z79899 Other long term (current) drug therapy: Secondary | ICD-10-CM | POA: Diagnosis not present

## 2022-06-29 DIAGNOSIS — R030 Elevated blood-pressure reading, without diagnosis of hypertension: Secondary | ICD-10-CM | POA: Diagnosis not present

## 2022-06-29 DIAGNOSIS — G8929 Other chronic pain: Secondary | ICD-10-CM | POA: Diagnosis not present

## 2022-06-29 DIAGNOSIS — M545 Low back pain, unspecified: Secondary | ICD-10-CM | POA: Diagnosis not present

## 2022-06-29 DIAGNOSIS — Z794 Long term (current) use of insulin: Secondary | ICD-10-CM | POA: Diagnosis not present

## 2022-06-29 DIAGNOSIS — E119 Type 2 diabetes mellitus without complications: Secondary | ICD-10-CM | POA: Diagnosis not present

## 2022-07-02 DIAGNOSIS — Z79899 Other long term (current) drug therapy: Secondary | ICD-10-CM | POA: Diagnosis not present

## 2022-07-29 DIAGNOSIS — Z9181 History of falling: Secondary | ICD-10-CM | POA: Diagnosis not present

## 2022-07-29 DIAGNOSIS — Z79899 Other long term (current) drug therapy: Secondary | ICD-10-CM | POA: Diagnosis not present

## 2022-07-29 DIAGNOSIS — E559 Vitamin D deficiency, unspecified: Secondary | ICD-10-CM | POA: Diagnosis not present

## 2022-07-29 DIAGNOSIS — M25522 Pain in left elbow: Secondary | ICD-10-CM | POA: Diagnosis not present

## 2022-07-29 DIAGNOSIS — N1831 Chronic kidney disease, stage 3a: Secondary | ICD-10-CM | POA: Diagnosis not present

## 2022-07-29 DIAGNOSIS — M129 Arthropathy, unspecified: Secondary | ICD-10-CM | POA: Diagnosis not present

## 2022-07-29 DIAGNOSIS — M25551 Pain in right hip: Secondary | ICD-10-CM | POA: Diagnosis not present

## 2022-07-29 DIAGNOSIS — M545 Low back pain, unspecified: Secondary | ICD-10-CM | POA: Diagnosis not present

## 2022-07-29 DIAGNOSIS — E119 Type 2 diabetes mellitus without complications: Secondary | ICD-10-CM | POA: Diagnosis not present

## 2022-07-29 DIAGNOSIS — G8929 Other chronic pain: Secondary | ICD-10-CM | POA: Diagnosis not present

## 2022-07-29 DIAGNOSIS — Z0000 Encounter for general adult medical examination without abnormal findings: Secondary | ICD-10-CM | POA: Diagnosis not present

## 2022-08-04 DIAGNOSIS — Z79899 Other long term (current) drug therapy: Secondary | ICD-10-CM | POA: Diagnosis not present

## 2022-08-11 DIAGNOSIS — Z79899 Other long term (current) drug therapy: Secondary | ICD-10-CM | POA: Diagnosis not present

## 2022-08-26 DIAGNOSIS — G8929 Other chronic pain: Secondary | ICD-10-CM | POA: Diagnosis not present

## 2022-08-26 DIAGNOSIS — M25551 Pain in right hip: Secondary | ICD-10-CM | POA: Diagnosis not present

## 2022-08-26 DIAGNOSIS — M129 Arthropathy, unspecified: Secondary | ICD-10-CM | POA: Diagnosis not present

## 2022-08-26 DIAGNOSIS — Z79899 Other long term (current) drug therapy: Secondary | ICD-10-CM | POA: Diagnosis not present

## 2022-08-26 DIAGNOSIS — N1831 Chronic kidney disease, stage 3a: Secondary | ICD-10-CM | POA: Diagnosis not present

## 2022-08-26 DIAGNOSIS — E119 Type 2 diabetes mellitus without complications: Secondary | ICD-10-CM | POA: Diagnosis not present

## 2022-08-26 DIAGNOSIS — E559 Vitamin D deficiency, unspecified: Secondary | ICD-10-CM | POA: Diagnosis not present

## 2022-08-26 DIAGNOSIS — Z1159 Encounter for screening for other viral diseases: Secondary | ICD-10-CM | POA: Diagnosis not present

## 2022-08-26 DIAGNOSIS — Z9181 History of falling: Secondary | ICD-10-CM | POA: Diagnosis not present

## 2022-08-26 DIAGNOSIS — M25522 Pain in left elbow: Secondary | ICD-10-CM | POA: Diagnosis not present

## 2022-08-26 DIAGNOSIS — M545 Low back pain, unspecified: Secondary | ICD-10-CM | POA: Diagnosis not present

## 2022-08-28 DIAGNOSIS — Z79899 Other long term (current) drug therapy: Secondary | ICD-10-CM | POA: Diagnosis not present

## 2022-09-24 DIAGNOSIS — E559 Vitamin D deficiency, unspecified: Secondary | ICD-10-CM | POA: Diagnosis not present

## 2022-09-24 DIAGNOSIS — G8929 Other chronic pain: Secondary | ICD-10-CM | POA: Diagnosis not present

## 2022-09-24 DIAGNOSIS — M545 Low back pain, unspecified: Secondary | ICD-10-CM | POA: Diagnosis not present

## 2022-09-24 DIAGNOSIS — Z79899 Other long term (current) drug therapy: Secondary | ICD-10-CM | POA: Diagnosis not present

## 2022-09-24 DIAGNOSIS — E119 Type 2 diabetes mellitus without complications: Secondary | ICD-10-CM | POA: Diagnosis not present

## 2022-09-24 DIAGNOSIS — N1831 Chronic kidney disease, stage 3a: Secondary | ICD-10-CM | POA: Diagnosis not present

## 2022-09-24 DIAGNOSIS — Z9181 History of falling: Secondary | ICD-10-CM | POA: Diagnosis not present

## 2022-09-28 DIAGNOSIS — Z79899 Other long term (current) drug therapy: Secondary | ICD-10-CM | POA: Diagnosis not present

## 2022-10-22 DIAGNOSIS — N1831 Chronic kidney disease, stage 3a: Secondary | ICD-10-CM | POA: Diagnosis not present

## 2022-10-22 DIAGNOSIS — Z9181 History of falling: Secondary | ICD-10-CM | POA: Diagnosis not present

## 2022-10-22 DIAGNOSIS — G8929 Other chronic pain: Secondary | ICD-10-CM | POA: Diagnosis not present

## 2022-10-22 DIAGNOSIS — Z79899 Other long term (current) drug therapy: Secondary | ICD-10-CM | POA: Diagnosis not present

## 2022-10-22 DIAGNOSIS — E119 Type 2 diabetes mellitus without complications: Secondary | ICD-10-CM | POA: Diagnosis not present

## 2022-10-22 DIAGNOSIS — M545 Low back pain, unspecified: Secondary | ICD-10-CM | POA: Diagnosis not present

## 2022-10-25 ENCOUNTER — Other Ambulatory Visit: Payer: Self-pay | Admitting: Internal Medicine

## 2022-10-26 DIAGNOSIS — Z79899 Other long term (current) drug therapy: Secondary | ICD-10-CM | POA: Diagnosis not present

## 2022-10-28 ENCOUNTER — Other Ambulatory Visit: Payer: Self-pay | Admitting: Internal Medicine

## 2022-10-30 ENCOUNTER — Other Ambulatory Visit: Payer: Self-pay

## 2022-11-19 DIAGNOSIS — Z79899 Other long term (current) drug therapy: Secondary | ICD-10-CM | POA: Diagnosis not present

## 2022-11-19 DIAGNOSIS — M545 Low back pain, unspecified: Secondary | ICD-10-CM | POA: Diagnosis not present

## 2022-11-19 DIAGNOSIS — Z9181 History of falling: Secondary | ICD-10-CM | POA: Diagnosis not present

## 2022-11-19 DIAGNOSIS — N1831 Chronic kidney disease, stage 3a: Secondary | ICD-10-CM | POA: Diagnosis not present

## 2022-11-19 DIAGNOSIS — E119 Type 2 diabetes mellitus without complications: Secondary | ICD-10-CM | POA: Diagnosis not present

## 2022-11-19 DIAGNOSIS — G8929 Other chronic pain: Secondary | ICD-10-CM | POA: Diagnosis not present

## 2022-11-20 DIAGNOSIS — Z79899 Other long term (current) drug therapy: Secondary | ICD-10-CM | POA: Diagnosis not present

## 2022-11-24 ENCOUNTER — Other Ambulatory Visit: Payer: Self-pay | Admitting: Internal Medicine

## 2022-12-17 ENCOUNTER — Other Ambulatory Visit: Payer: Self-pay | Admitting: Internal Medicine

## 2022-12-17 DIAGNOSIS — G8929 Other chronic pain: Secondary | ICD-10-CM | POA: Diagnosis not present

## 2022-12-17 DIAGNOSIS — M545 Low back pain, unspecified: Secondary | ICD-10-CM | POA: Diagnosis not present

## 2022-12-17 DIAGNOSIS — Z9181 History of falling: Secondary | ICD-10-CM | POA: Diagnosis not present

## 2022-12-17 DIAGNOSIS — Z79899 Other long term (current) drug therapy: Secondary | ICD-10-CM | POA: Diagnosis not present

## 2022-12-18 ENCOUNTER — Other Ambulatory Visit: Payer: Self-pay

## 2022-12-18 ENCOUNTER — Telehealth: Payer: Self-pay | Admitting: Internal Medicine

## 2022-12-18 MED ORDER — OLANZAPINE 5 MG PO TABS: 5.0000 mg | ORAL_TABLET | Freq: Every day | ORAL | 0 refills | Status: DC

## 2022-12-18 NOTE — Telephone Encounter (Signed)
Refill Sent. 

## 2022-12-18 NOTE — Telephone Encounter (Signed)
Patient has OV scheduled for 12/31/2022.    Prescription Request  12/18/2022  LOV: 10/07/2021  What is the name of the medication or equipment? OLANZapine (ZYPREXA) 5 MG tablet   Have you contacted your pharmacy to request a refill? Yes   Which pharmacy would you like this sent to?  [id number] [place],  - [address] [place] [address] Phone: [phone] Fax: [phone]    Patient notified that their request is being sent to the clinical staff for review and that they should receive a response within 2 business days.   Please advise at [facility] [phone]

## 2022-12-31 ENCOUNTER — Ambulatory Visit: Payer: Medicare Other | Admitting: Internal Medicine

## 2023-01-08 ENCOUNTER — Encounter: Payer: Self-pay | Admitting: Internal Medicine

## 2023-01-08 ENCOUNTER — Ambulatory Visit (INDEPENDENT_AMBULATORY_CARE_PROVIDER_SITE_OTHER): Payer: 59 | Admitting: Internal Medicine

## 2023-01-08 ENCOUNTER — Ambulatory Visit (INDEPENDENT_AMBULATORY_CARE_PROVIDER_SITE_OTHER): Payer: 59

## 2023-01-08 VITALS — BP 126/82 | HR 67 | Temp 99.5°F | Ht 59.0 in | Wt 146.0 lb

## 2023-01-08 DIAGNOSIS — F0390 Unspecified dementia without behavioral disturbance: Secondary | ICD-10-CM | POA: Diagnosis not present

## 2023-01-08 DIAGNOSIS — E559 Vitamin D deficiency, unspecified: Secondary | ICD-10-CM

## 2023-01-08 DIAGNOSIS — E538 Deficiency of other specified B group vitamins: Secondary | ICD-10-CM | POA: Diagnosis not present

## 2023-01-08 DIAGNOSIS — R509 Fever, unspecified: Secondary | ICD-10-CM | POA: Insufficient documentation

## 2023-01-08 DIAGNOSIS — E785 Hyperlipidemia, unspecified: Secondary | ICD-10-CM | POA: Diagnosis not present

## 2023-01-08 DIAGNOSIS — F411 Generalized anxiety disorder: Secondary | ICD-10-CM

## 2023-01-08 DIAGNOSIS — I700 Atherosclerosis of aorta: Secondary | ICD-10-CM | POA: Diagnosis not present

## 2023-01-08 DIAGNOSIS — R5081 Fever presenting with conditions classified elsewhere: Secondary | ICD-10-CM | POA: Diagnosis not present

## 2023-01-08 DIAGNOSIS — Z0000 Encounter for general adult medical examination without abnormal findings: Secondary | ICD-10-CM | POA: Diagnosis not present

## 2023-01-08 DIAGNOSIS — Z0001 Encounter for general adult medical examination with abnormal findings: Secondary | ICD-10-CM

## 2023-01-08 DIAGNOSIS — R8290 Unspecified abnormal findings in urine: Secondary | ICD-10-CM

## 2023-01-08 DIAGNOSIS — E118 Type 2 diabetes mellitus with unspecified complications: Secondary | ICD-10-CM

## 2023-01-08 DIAGNOSIS — I10 Essential (primary) hypertension: Secondary | ICD-10-CM | POA: Diagnosis not present

## 2023-01-08 MED ORDER — HYDROCHLOROTHIAZIDE 12.5 MG PO CAPS: 12.5000 mg | ORAL_CAPSULE | Freq: Every day | ORAL | 3 refills | Status: DC

## 2023-01-08 MED ORDER — OLANZAPINE 5 MG PO TABS: 5.0000 mg | ORAL_TABLET | Freq: Every day | ORAL | 3 refills | Status: DC

## 2023-01-08 MED ORDER — METFORMIN HCL ER 500 MG PO TB24: 500.0000 mg | ORAL_TABLET | Freq: Two times a day (BID) | ORAL | 3 refills | Status: DC

## 2023-01-08 MED ORDER — IRBESARTAN 300 MG PO TABS: 300.0000 mg | ORAL_TABLET | Freq: Every day | ORAL | 3 refills | Status: DC

## 2023-01-08 MED ORDER — BUPROPION HCL ER (XL) 300 MG PO TB24: 300.0000 mg | ORAL_TABLET | Freq: Every day | ORAL | 3 refills | Status: DC

## 2023-01-08 MED ORDER — FLUOXETINE HCL 40 MG PO CAPS: 40.0000 mg | ORAL_CAPSULE | Freq: Every day | ORAL | 3 refills | Status: DC

## 2023-01-08 MED ORDER — PIOGLITAZONE HCL 45 MG PO TABS: 45.0000 mg | ORAL_TABLET | Freq: Every day | ORAL | 3 refills | Status: DC

## 2023-01-08 MED ORDER — ALENDRONATE SODIUM 70 MG PO TABS: 70.0000 mg | ORAL_TABLET | ORAL | 3 refills | Status: DC

## 2023-01-08 MED ORDER — ROSUVASTATIN CALCIUM 10 MG PO TABS: ORAL_TABLET | ORAL | 3 refills | Status: DC

## 2023-01-08 MED ORDER — AMLODIPINE BESYLATE 10 MG PO TABS: 10.0000 mg | ORAL_TABLET | Freq: Every day | ORAL | 3 refills | Status: DC

## 2023-01-08 NOTE — Patient Instructions (Addendum)
Please have your Shingrix (shingles) shots done at your local pharmacy.  Please remember to make your yearly eye exam appt  Please continue all other medications as before, and refills have been done if requested.  Please have the pharmacy call with any other refills you may need.  Please continue your efforts at being more active, low cholesterol diet, and weight control.  You are otherwise up to date with prevention measures today.  Please keep your appointments with your specialists as you may have planned  Please go to the XRAY Department in the first floor for the x-ray testing  Please go to the LAB at the blood drawing area for the tests to be done  You will be contacted by phone if any changes need to be made immediately.  Otherwise, you will receive a letter about your results with an explanation, but please check with MyChart first.  Please remember to sign up for MyChart if you have not done so, as this will be important to you in the future with finding out test results, communicating by private email, and scheduling acute appointments online when needed.  Please make an Appointment to return in 6 months, or sooner if needed

## 2023-01-08 NOTE — Progress Notes (Signed)
Patient ID: Latasha Wang, female   DOB: 08/31/1940, 82 y.o.   MRN: [id number]         Chief Complaint:: wellness exam and low grade temp, dm, htn, hld, anxiety, low b12 and Vit D       HPI:  Latasha Wang is a 82 y.o. female here for wellness exam; declines covid booster, for shingrix at pharmacy, family with her states will call for eye exam soon, o/w up to date                        Also has low grade temp with fatigue today, but behavior and hallucinations no changes per family, Denies urinary symptoms such as dysuria, frequency, urgency, flank pain, hematuria or n/v, fever, chills.  No cough.  Pt denies chest pain, increased sob or doe, wheezing, orthopnea, PND, increased LE swelling, palpitations, dizziness or syncope.   Pt denies polydipsia, polyuria, or new focal neuro s/s.    Pt denies recent wt loss, night sweats, loss of appetite, or other constitutional symptoms  Dementia overall stable symptomatically, and not assoc with behavioral changes such as hallucinations, paranoia, or agitation.    Wt Readings from Last 3 Encounters:  01/08/23 146 lb (66.2 kg)  10/07/21 148 lb 6.4 oz (67.3 kg)  07/24/20 142 lb (64.4 kg)   BP Readings from Last 3 Encounters:  01/08/23 126/82  10/07/21 (!) 148/78  07/24/20 (!) 162/80   Immunization History  Administered Date(s) Administered   Influenza Whole 05/06/2007, 03/26/2008   Influenza, High Dose Seasonal PF 06/09/2016, 06/10/2017, 06/13/2018   Influenza-Unspecified 03/22/2014   PFIZER Comirnaty(Gray Top)Covid-19 Tri-Sucrose Vaccine 03/22/2021   PPD Test 03/18/2016   Pneumococcal Conjugate-13 06/13/2013, 12/12/2013   Pneumococcal Polysaccharide-23 12/13/2006   Td 12/17/2008   Tdap 12/19/2018, 12/19/2018   Health Maintenance Due  Topic Date Due   Zoster Vaccines- Shingrix (1 of 2) Never done   COVID-19 Vaccine (2 - Pfizer risk series) 04/12/2021   HEMOGLOBIN A1C  04/08/2022   OPHTHALMOLOGY EXAM  05/12/2022   Diabetic kidney evaluation -  eGFR measurement  10/08/2022   Diabetic kidney evaluation - Urine ACR  10/08/2022      Past Medical History:  Diagnosis Date   ANXIETY    Dementia (HCC) 07/24/2020   DEPRESSION    Diabetes mellitus    Essential hypertension 05/09/2007   Qualifier: Diagnosis of  By: Nena Jordan    FATTY LIVER DISEASE    Gastroparesis    GERD    HYPERLIPIDEMIA    HYPERTENSION    LATERAL EPICONDYLITIS, RIGHT    Past Surgical History:  Procedure Laterality Date   CATARACT EXTRACTION, BILATERAL     TOTAL ELBOW ARTHROPLASTY  02/19/2012   Procedure: TOTAL ELBOW ARTHROPLASTY;  Surgeon: Budd Palmer, MD;  Location: [facility] OR;  Service: Orthopedics;  Laterality: Left;    reports that she has never smoked. She has never used smokeless tobacco. She reports that she does not drink alcohol and does not use drugs. family history is not on file. No Known Allergies Current Outpatient Medications on File Prior to Visit  Medication Sig Dispense Refill   acetaminophen (TYLENOL) 500 MG tablet Take 1-2 tablets (500-1,000 mg total) by mouth every 6 (six) hours as needed for pain or fever. For pain 90 tablet 0   albuterol (PROVENTIL HFA;VENTOLIN HFA) 108 (90 BASE) MCG/ACT inhaler Inhale 2 puffs into the lungs every 6 (six) hours as needed for wheezing. 1 Inhaler  0   chlorhexidine (PERIDEX) 0.12 % solution Use as directed 30 mLs in the mouth or throat 3 (three) times daily.     ketoconazole (NIZORAL) 2 % shampoo Apply 1 application topically every Friday.  4   meclizine (ANTIVERT) 12.5 MG tablet TAKE 1-2 TABLETS (12.5-25 MG TOTAL) BY MOUTH EVERY 6 (SIX) HOURS AS NEEDED. 30 tablet 2   NARCAN 4 MG/0.1ML LIQD nasal spray kit   0   No current facility-administered medications on file prior to visit.        ROS:  All others reviewed and negative.  Objective        PE:  BP 126/82 (BP Location: Left Arm, Patient Position: Sitting, Cuff Size: Normal)   Pulse 67   Temp 99.5 F (37.5 C) (Oral)   Ht 4\' 11"  (1.499 m)    Wt 146 lb (66.2 kg)   SpO2 96%   BMI 29.49 kg/m                 Constitutional: Pt appears in NAD               HENT: Head: NCAT.                Right Ear: External ear normal.                 Left Ear: External ear normal.                Eyes: . Pupils are equal, round, and reactive to light. Conjunctivae and EOM are normal               Nose: without d/c or deformity               Neck: Neck supple. Gross normal ROM               Cardiovascular: Normal rate and regular rhythm.                 Pulmonary/Chest: Effort normal and breath sounds without rales or wheezing.                Abd:  Soft, NT, ND, + BS, no organomegaly               Neurological: Pt is alert. At baseline orientation, motor grossly intact               Skin: Skin is warm. No rashes, no other new lesions, LE edema - none               Psychiatric: Pt behavior is normal without agitation   Micro: none  Cardiac tracings I have personally interpreted today:  none  Pertinent Radiological findings (summarize): none   Lab Results  Component Value Date   WBC 6.9 10/07/2021   HGB 11.7 (L) 10/07/2021   HCT 35.6 (L) 10/07/2021   PLT 235.0 10/07/2021   GLUCOSE 89 10/07/2021   CHOL 119 10/07/2021   TRIG 110.0 10/07/2021   HDL 69.60 10/07/2021   LDLDIRECT 85.1 08/19/2006   LDLCALC 27 10/07/2021   ALT 20 10/07/2021   AST 20 10/07/2021   NA 140 10/07/2021   K 4.1 10/07/2021   CL 100 10/07/2021   CREATININE 0.98 10/07/2021   BUN 20 10/07/2021   CO2 32 10/07/2021   TSH 2.45 10/07/2021   INR 0.91 02/19/2012   HGBA1C 6.4 10/07/2021   MICROALBUR 8.1 (H) 10/07/2021   Assessment/Plan:  Latasha Wang is a  82 y.o. Asian [4] female with  has a past medical history of ANXIETY, Dementia (HCC) (07/24/2020), DEPRESSION, Diabetes mellitus, Essential hypertension (05/09/2007), FATTY LIVER DISEASE, Gastroparesis, GERD, HYPERLIPIDEMIA, HYPERTENSION, and LATERAL EPICONDYLITIS, RIGHT.  Encounter for well adult exam with abnormal  findings Age and sex appropriate education and counseling updated with regular exercise and diet Referrals for preventative services - family states will call for eye exam Immunizations addressed - declines covid boostser, for shingrix at pharmacy Smoking counseling  - none needed Evidence for depression or other mood disorder - none significant Most recent labs reviewed. I have personally reviewed and have noted: 1) the patient's medical and social history 2) The patient's current medications and supplements 3) The patient's height, weight, and BMI have been recorded in the chart   Abnormal urinalysis Also for urine culture given low grade temp  Anxiety state Stable overall, declines need for change in tx or referral  Controlled diabetes mellitus type 2 with complications (HCC) Lab Results  Component Value Date   HGBA1C 6.4 10/07/2021   Stable, pt to continue current medical treatment metformin ER 500 mg - 1 bid, actos 45 mg qd   Essential hypertension BP Readings from Last 3 Encounters:  01/08/23 126/82  10/07/21 (!) 148/78  07/24/20 (!) 162/80   Stable, pt to continue medical treatment norvasc 10 mg every day, hct 12.5 mg every day, avapro 300 qd   Fever Exam benign, for UA with labs and cxr  Hyperlipidemia Lab Results  Component Value Date   LDLCALC 27 10/07/2021   Stable, pt to continue current statin crestor 10 mg qd   B12 deficiency Lab Results  Component Value Date   VITAMINB12 547 10/07/2021   Stable, cont oral replacement - b12 1000 mcg qd   Vitamin D deficiency Last vitamin D Lab Results  Component Value Date   VD25OH 113.01 (HH) 10/07/2021   Stable, cont oral replacement  Followup: Return in about 6 months (around 07/11/2023).  Oliver Barre, MD 01/10/2023 12:15 PM [facility]

## 2023-01-10 ENCOUNTER — Encounter: Payer: Self-pay | Admitting: Internal Medicine

## 2023-01-10 DIAGNOSIS — E538 Deficiency of other specified B group vitamins: Secondary | ICD-10-CM | POA: Insufficient documentation

## 2023-01-10 DIAGNOSIS — E559 Vitamin D deficiency, unspecified: Secondary | ICD-10-CM | POA: Insufficient documentation

## 2023-01-10 LAB — HEMOGLOBIN A1C
Hgb A1c MFr Bld: 6.2 % of total Hgb — ABNORMAL HIGH (ref <5.7)
Mean Plasma Glucose: 131 mg/dL
eAG (mmol/L): 7.3 mmol/L

## 2023-01-10 LAB — CBC WITH DIFFERENTIAL/PLATELET
Absolute Monocytes: 464 cells/uL (ref 200–950)
Basophils Absolute: 41 cells/uL (ref 0–200)
Basophils Relative: 0.7 %
Eosinophils Absolute: 151 cells/uL (ref 15–500)
Eosinophils Relative: 2.6 %
HCT: 36.5 % (ref 35.0–45.0)
Hemoglobin: 11.9 g/dL (ref 11.7–15.5)
Lymphs Abs: 1508 cells/uL (ref 850–3900)
MCH: 29.3 pg (ref 27.0–33.0)
MCHC: 32.6 g/dL (ref 32.0–36.0)
MCV: 89.9 fL (ref 80.0–100.0)
MPV: 11 fL (ref 7.5–12.5)
Monocytes Relative: 8 %
Neutro Abs: 3637 cells/uL (ref 1500–7800)
Neutrophils Relative %: 62.7 %
Platelets: 233 10*3/uL (ref 140–400)
RBC: 4.06 10*6/uL (ref 3.80–5.10)
RDW: 13.5 % (ref 11.0–15.0)
Total Lymphocyte: 26 %
WBC: 5.8 10*3/uL (ref 3.8–10.8)

## 2023-01-10 LAB — HEPATIC FUNCTION PANEL
AG Ratio: 1.6 (calc) (ref 1.0–2.5)
ALT: 18 U/L (ref 6–29)
AST: 22 U/L (ref 10–35)
Albumin: 4.4 g/dL (ref 3.6–5.1)
Alkaline phosphatase (APISO): 47 U/L (ref 37–153)
Bilirubin, Direct: 0.2 mg/dL (ref 0.0–0.2)
Globulin: 2.7 g/dL (calc) (ref 1.9–3.7)
Indirect Bilirubin: 0.7 mg/dL (calc) (ref 0.2–1.2)
Total Bilirubin: 0.9 mg/dL (ref 0.2–1.2)
Total Protein: 7.1 g/dL (ref 6.1–8.1)

## 2023-01-10 LAB — BASIC METABOLIC PANEL
BUN/Creatinine Ratio: 15 (calc) (ref 6–22)
BUN: 17 mg/dL (ref 7–25)
CO2: 26 mmol/L (ref 20–32)
Calcium: 9.9 mg/dL (ref 8.6–10.4)
Chloride: 105 mmol/L (ref 98–110)
Creat: 1.16 mg/dL — ABNORMAL HIGH (ref 0.60–0.95)
Glucose, Bld: 106 mg/dL — ABNORMAL HIGH (ref 65–99)
Potassium: 4.1 mmol/L (ref 3.5–5.3)
Sodium: 142 mmol/L (ref 135–146)

## 2023-01-10 LAB — VITAMIN B12: Vitamin B-12: 823 pg/mL (ref 200–1100)

## 2023-01-10 LAB — URINALYSIS, ROUTINE W REFLEX MICROSCOPIC
Bacteria, UA: NONE SEEN /HPF
Bilirubin Urine: NEGATIVE
Glucose, UA: NEGATIVE
Hgb urine dipstick: NEGATIVE
Hyaline Cast: NONE SEEN /LPF
Ketones, ur: NEGATIVE
Nitrite: NEGATIVE
Protein, ur: NEGATIVE
RBC / HPF: NONE SEEN /HPF (ref 0–2)
Specific Gravity, Urine: 1.01 (ref 1.001–1.035)
pH: 7 (ref 5.0–8.0)

## 2023-01-10 LAB — LIPID PANEL
Cholesterol: 132 mg/dL (ref <200)
HDL: 74 mg/dL (ref >=50)
LDL Cholesterol (Calc): 36 mg/dL (calc)
Non-HDL Cholesterol (Calc): 58 mg/dL (calc) (ref <130)
Total CHOL/HDL Ratio: 1.8 (calc) (ref <5.0)
Triglycerides: 139 mg/dL (ref <150)

## 2023-01-10 LAB — URINE CULTURE

## 2023-01-10 LAB — VITAMIN D 25 HYDROXY (VIT D DEFICIENCY, FRACTURES): Vit D, 25-Hydroxy: 84 ng/mL (ref 30–100)

## 2023-01-10 LAB — MICROALBUMIN / CREATININE URINE RATIO
Creatinine, Urine: 81 mg/dL (ref 20–275)
Microalb Creat Ratio: 38 mg/g creat — ABNORMAL HIGH (ref <30)
Microalb, Ur: 3.1 mg/dL

## 2023-01-10 LAB — TSH: TSH: 2.25 mIU/L (ref 0.40–4.50)

## 2023-01-10 NOTE — Assessment & Plan Note (Signed)
BP Readings from Last 3 Encounters:  01/08/23 126/82  10/07/21 (!) 148/78  07/24/20 (!) 162/80   Stable, pt to continue medical treatment norvasc 10 mg every day, hct 12.5 mg every day, avapro 300 qd

## 2023-01-10 NOTE — Assessment & Plan Note (Signed)
Lab Results  Component Value Date   LDLCALC 27 10/07/2021   Stable, pt to continue current statin crestor 10 mg qd

## 2023-01-10 NOTE — Assessment & Plan Note (Signed)
Last vitamin D Lab Results  Component Value Date   VD25OH 113.01 (HH) 10/07/2021   Stable, cont oral replacement

## 2023-01-10 NOTE — Assessment & Plan Note (Signed)
Also for urine culture given low grade temp

## 2023-01-10 NOTE — Assessment & Plan Note (Signed)
Lab Results  Component Value Date   HGBA1C 6.4 10/07/2021   Stable, pt to continue current medical treatment metformin ER 500 mg - 1 bid, actos 45 mg qd

## 2023-01-10 NOTE — Assessment & Plan Note (Signed)
Age and sex appropriate education and counseling updated with regular exercise and diet Referrals for preventative services - family states will call for eye exam Immunizations addressed - declines covid boostser, for shingrix at pharmacy Smoking counseling  - none needed Evidence for depression or other mood disorder - none significant Most recent labs reviewed. I have personally reviewed and have noted: 1) the patient's medical and social history 2) The patient's current medications and supplements 3) The patient's height, weight, and BMI have been recorded in the chart

## 2023-01-10 NOTE — Assessment & Plan Note (Signed)
Stable overall, declines need for change in tx or referral 

## 2023-01-10 NOTE — Assessment & Plan Note (Signed)
Exam benign, for UA with labs and cxr

## 2023-01-10 NOTE — Assessment & Plan Note (Signed)
Lab Results  Component Value Date   VITAMINB12 547 10/07/2021   Stable, cont oral replacement - b12 1000 mcg qd

## 2023-01-11 ENCOUNTER — Other Ambulatory Visit: Payer: Self-pay | Admitting: Internal Medicine

## 2023-01-11 MED ORDER — CIPROFLOXACIN HCL 500 MG PO TABS: 500.0000 mg | ORAL_TABLET | Freq: Two times a day (BID) | ORAL | 0 refills | Status: AC

## 2023-01-15 DIAGNOSIS — E119 Type 2 diabetes mellitus without complications: Secondary | ICD-10-CM | POA: Diagnosis not present

## 2023-01-15 DIAGNOSIS — N1831 Chronic kidney disease, stage 3a: Secondary | ICD-10-CM | POA: Diagnosis not present

## 2023-01-15 DIAGNOSIS — Z79899 Other long term (current) drug therapy: Secondary | ICD-10-CM | POA: Diagnosis not present

## 2023-01-15 DIAGNOSIS — Z9181 History of falling: Secondary | ICD-10-CM | POA: Diagnosis not present

## 2023-01-15 DIAGNOSIS — M545 Low back pain, unspecified: Secondary | ICD-10-CM | POA: Diagnosis not present

## 2023-01-15 DIAGNOSIS — G8929 Other chronic pain: Secondary | ICD-10-CM | POA: Diagnosis not present

## 2023-01-19 DIAGNOSIS — Z79899 Other long term (current) drug therapy: Secondary | ICD-10-CM | POA: Diagnosis not present

## 2023-02-12 DIAGNOSIS — M545 Low back pain, unspecified: Secondary | ICD-10-CM | POA: Diagnosis not present

## 2023-02-12 DIAGNOSIS — E119 Type 2 diabetes mellitus without complications: Secondary | ICD-10-CM | POA: Diagnosis not present

## 2023-02-12 DIAGNOSIS — Z9181 History of falling: Secondary | ICD-10-CM | POA: Diagnosis not present

## 2023-02-12 DIAGNOSIS — N1831 Chronic kidney disease, stage 3a: Secondary | ICD-10-CM | POA: Diagnosis not present

## 2023-02-12 DIAGNOSIS — Z79899 Other long term (current) drug therapy: Secondary | ICD-10-CM | POA: Diagnosis not present

## 2023-02-12 DIAGNOSIS — G8929 Other chronic pain: Secondary | ICD-10-CM | POA: Diagnosis not present

## 2023-02-16 DIAGNOSIS — Z79899 Other long term (current) drug therapy: Secondary | ICD-10-CM | POA: Diagnosis not present

## 2023-03-12 DIAGNOSIS — N1831 Chronic kidney disease, stage 3a: Secondary | ICD-10-CM | POA: Diagnosis not present

## 2023-03-12 DIAGNOSIS — E559 Vitamin D deficiency, unspecified: Secondary | ICD-10-CM | POA: Diagnosis not present

## 2023-03-12 DIAGNOSIS — M129 Arthropathy, unspecified: Secondary | ICD-10-CM | POA: Diagnosis not present

## 2023-03-12 DIAGNOSIS — Z9181 History of falling: Secondary | ICD-10-CM | POA: Diagnosis not present

## 2023-03-12 DIAGNOSIS — E119 Type 2 diabetes mellitus without complications: Secondary | ICD-10-CM | POA: Diagnosis not present

## 2023-03-12 DIAGNOSIS — E7800 Pure hypercholesterolemia, unspecified: Secondary | ICD-10-CM | POA: Diagnosis not present

## 2023-03-12 DIAGNOSIS — Z79899 Other long term (current) drug therapy: Secondary | ICD-10-CM | POA: Diagnosis not present

## 2023-03-12 DIAGNOSIS — M545 Low back pain, unspecified: Secondary | ICD-10-CM | POA: Diagnosis not present

## 2023-03-12 DIAGNOSIS — Z23 Encounter for immunization: Secondary | ICD-10-CM | POA: Diagnosis not present

## 2023-03-12 DIAGNOSIS — G8929 Other chronic pain: Secondary | ICD-10-CM | POA: Diagnosis not present

## 2023-03-16 DIAGNOSIS — Z79899 Other long term (current) drug therapy: Secondary | ICD-10-CM | POA: Diagnosis not present

## 2023-04-09 DIAGNOSIS — M545 Low back pain, unspecified: Secondary | ICD-10-CM | POA: Diagnosis not present

## 2023-04-09 DIAGNOSIS — Z79899 Other long term (current) drug therapy: Secondary | ICD-10-CM | POA: Diagnosis not present

## 2023-04-09 DIAGNOSIS — N1831 Chronic kidney disease, stage 3a: Secondary | ICD-10-CM | POA: Diagnosis not present

## 2023-04-09 DIAGNOSIS — Z7689 Persons encountering health services in other specified circumstances: Secondary | ICD-10-CM | POA: Diagnosis not present

## 2023-04-09 DIAGNOSIS — E119 Type 2 diabetes mellitus without complications: Secondary | ICD-10-CM | POA: Diagnosis not present

## 2023-04-09 DIAGNOSIS — Z9181 History of falling: Secondary | ICD-10-CM | POA: Diagnosis not present

## 2023-04-09 DIAGNOSIS — G8929 Other chronic pain: Secondary | ICD-10-CM | POA: Diagnosis not present

## 2023-04-13 DIAGNOSIS — Z79899 Other long term (current) drug therapy: Secondary | ICD-10-CM | POA: Diagnosis not present

## 2023-04-15 ENCOUNTER — Ambulatory Visit: Payer: 59 | Admitting: Internal Medicine

## 2023-04-20 ENCOUNTER — Ambulatory Visit: Payer: 59 | Admitting: Internal Medicine

## 2023-05-04 ENCOUNTER — Encounter: Payer: Self-pay | Admitting: Internal Medicine

## 2023-05-04 ENCOUNTER — Ambulatory Visit: Payer: 59 | Admitting: Internal Medicine

## 2023-05-04 ENCOUNTER — Ambulatory Visit (INDEPENDENT_AMBULATORY_CARE_PROVIDER_SITE_OTHER): Payer: 59

## 2023-05-04 VITALS — BP 122/76 | HR 70 | Temp 99.2°F | Ht 59.0 in | Wt 139.0 lb

## 2023-05-04 DIAGNOSIS — Z7984 Long term (current) use of oral hypoglycemic drugs: Secondary | ICD-10-CM

## 2023-05-04 DIAGNOSIS — R7611 Nonspecific reaction to tuberculin skin test without active tuberculosis: Secondary | ICD-10-CM

## 2023-05-04 DIAGNOSIS — E118 Type 2 diabetes mellitus with unspecified complications: Secondary | ICD-10-CM | POA: Diagnosis not present

## 2023-05-04 DIAGNOSIS — F17210 Nicotine dependence, cigarettes, uncomplicated: Secondary | ICD-10-CM | POA: Diagnosis not present

## 2023-05-04 DIAGNOSIS — I700 Atherosclerosis of aorta: Secondary | ICD-10-CM | POA: Diagnosis not present

## 2023-05-04 DIAGNOSIS — F32A Depression, unspecified: Secondary | ICD-10-CM | POA: Diagnosis not present

## 2023-05-04 DIAGNOSIS — I10 Essential (primary) hypertension: Secondary | ICD-10-CM

## 2023-05-04 DIAGNOSIS — I771 Stricture of artery: Secondary | ICD-10-CM | POA: Diagnosis not present

## 2023-05-04 DIAGNOSIS — I517 Cardiomegaly: Secondary | ICD-10-CM | POA: Diagnosis not present

## 2023-05-04 NOTE — Progress Notes (Unsigned)
Patient ID: Latasha Wang, female   DOB: 11-06-1940, 82 y.o.   MRN: [id number]        Chief Complaint: follow up HTN, HLD and hyperglycemia ***       HPI:  Latasha Wang is a 82 y.o. 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  Wt Readings from Last 3 Encounters:  05/04/23 139 lb (63 kg)  01/08/23 146 lb (66.2 kg)  10/07/21 148 lb 6.4 oz (67.3 kg)   BP Readings from Last 3 Encounters:  05/04/23 122/76  01/08/23 126/82  10/07/21 (!) 148/78         Past Medical History:  Diagnosis Date   ANXIETY    Dementia (HCC) 07/24/2020   DEPRESSION    Diabetes mellitus    Essential hypertension 05/09/2007   Qualifier: Diagnosis of  By: Nena Jordan    FATTY LIVER DISEASE    Gastroparesis    GERD    HYPERLIPIDEMIA    HYPERTENSION    LATERAL EPICONDYLITIS, RIGHT    Past Surgical History:  Procedure Laterality Date   CATARACT EXTRACTION, BILATERAL     TOTAL ELBOW ARTHROPLASTY  02/19/2012   Procedure: TOTAL ELBOW ARTHROPLASTY;  Surgeon: Budd Palmer, MD;  Location: [facility] OR;  Service: Orthopedics;  Laterality: Left;    reports that she has never smoked. She has never used smokeless tobacco. She reports that she does not drink alcohol and does not use drugs. family history is not on file. No Known Allergies Current Outpatient Medications on File Prior to Visit  Medication Sig Dispense Refill   acetaminophen (TYLENOL) 500 MG tablet Take 1-2 tablets (500-1,000 mg total) by mouth every 6 (six) hours as needed for pain or fever. For pain 90 tablet 0   albuterol (PROVENTIL HFA;VENTOLIN HFA) 108 (90 BASE) MCG/ACT inhaler Inhale 2 puffs into the lungs every 6 (six) hours as needed for wheezing. 1 Inhaler 0   alendronate (FOSAMAX) 70 MG tablet Take 1 tablet (70 mg total) by mouth once a week. Take with a full glass of water on an empty stomach. 12 tablet 3   amLODipine (NORVASC) 10 MG tablet Take 1  tablet (10 mg total) by mouth daily. 90 tablet 3   buPROPion (WELLBUTRIN XL) 300 MG 24 hr tablet Take 1 tablet (300 mg total) by mouth daily. 90 tablet 3   chlorhexidine (PERIDEX) 0.12 % solution Use as directed 30 mLs in the mouth or throat 3 (three) times daily.     FLUoxetine (PROZAC) 40 MG capsule Take 1 capsule (40 mg total) by mouth daily. 90 capsule 3   hydrochlorothiazide (MICROZIDE) 12.5 MG capsule Take 1 capsule (12.5 mg total) by mouth daily. 90 capsule 3   irbesartan (AVAPRO) 300 MG tablet Take 1 tablet (300 mg total) by mouth daily. 90 tablet 3   ketoconazole (NIZORAL) 2 % shampoo Apply 1 application topically every Friday.  4   meclizine (ANTIVERT) 12.5 MG tablet TAKE 1-2 TABLETS (12.5-25 MG TOTAL) BY MOUTH EVERY 6 (SIX) HOURS AS NEEDED. 30 tablet 2   metFORMIN (GLUCOPHAGE-XR) 500 MG 24 hr tablet Take 1 tablet (500 mg total) by mouth 2 (two) times daily. 180 tablet 3   NARCAN 4 MG/0.1ML LIQD nasal spray kit   0   OLANZapine (ZYPREXA) 5 MG tablet Take 1 tablet (5 mg total) by mouth  daily. 90 tablet 3   pioglitazone (ACTOS) 45 MG tablet Take 1 tablet (45 mg total) by mouth daily. 90 tablet 3   rosuvastatin (CRESTOR) 10 MG tablet TAKE 1 TABLET BY MOUTH EVERY EVENING 90 tablet 3   No current facility-administered medications on file prior to visit.        ROS:  All others reviewed and negative.  Objective        PE:  BP 122/76 (BP Location: Right Arm, Patient Position: Sitting, Cuff Size: Normal)   Pulse 70   Temp 99.2 F (37.3 C) (Oral)   Ht 4\' 11"  (1.499 m)   Wt 139 lb (63 kg)   SpO2 99%   BMI 28.07 kg/m                 Constitutional: Pt appears in NAD               HENT: Head: NCAT.                Right Ear: External ear normal.                 Left Ear: External ear normal.                Eyes: . Pupils are equal, round, and reactive to light. Conjunctivae and EOM are normal               Nose: without d/c or deformity               Neck: Neck supple. Gross normal  ROM               Cardiovascular: Normal rate and regular rhythm.                 Pulmonary/Chest: Effort normal and breath sounds without rales or wheezing.                Abd:  Soft, NT, ND, + BS, no organomegaly               Neurological: Pt is alert. At baseline orientation, motor grossly intact               Skin: Skin is warm. No rashes, no other new lesions, LE edema - ***               Psychiatric: Pt behavior is normal without agitation   Micro: none  Cardiac tracings I have personally interpreted today:  none  Pertinent Radiological findings (summarize): none   Lab Results  Component Value Date   WBC 5.8 01/08/2023   HGB 11.9 01/08/2023   HCT 36.5 01/08/2023   PLT 233 01/08/2023   GLUCOSE 106 (H) 01/08/2023   CHOL 132 01/08/2023   TRIG 139 01/08/2023   HDL 74 01/08/2023   LDLDIRECT 85.1 08/19/2006   LDLCALC 36 01/08/2023   ALT 18 01/08/2023   AST 22 01/08/2023   NA 142 01/08/2023   K 4.1 01/08/2023   CL 105 01/08/2023   CREATININE 1.16 (H) 01/08/2023   BUN 17 01/08/2023   CO2 26 01/08/2023   TSH 2.25 01/08/2023   INR 0.91 02/19/2012   HGBA1C 6.2 (H) 01/08/2023   MICROALBUR 3.1 01/08/2023   Assessment/Plan:  Kenzy T Loeper is a 82 y.o. Asian [4] female with  has a past medical history of ANXIETY, Dementia (HCC) (07/24/2020), DEPRESSION, Diabetes mellitus, Essential hypertension (05/09/2007), FATTY LIVER DISEASE, Gastroparesis, GERD, HYPERLIPIDEMIA, HYPERTENSION, and LATERAL EPICONDYLITIS, RIGHT.  No problem-specific Assessment & Plan notes found for this encounter.  Followup: No follow-ups on file.  Oliver Barre, MD 05/04/2023 1:51 PM [facility] [place] [facility]

## 2023-05-04 NOTE — Patient Instructions (Addendum)
Please have your Shingrix (shingles) shots done at your local pharmacy.  Please continue all other medications as before, and refills have been done if requested.  Please have the pharmacy call with any other refills you may need.  Please continue your efforts at being more active, low cholesterol diet, and weight control.  You are otherwise up to date with prevention measures today.  Please keep your appointments with your specialists as you may have planned  Please go to the XRAY Department in the first floor for the x-ray testing  You will be contacted by phone if any changes need to be made immediately.  Otherwise, you will receive a letter about your results with an explanation, but please check with MyChart first.

## 2023-05-05 ENCOUNTER — Encounter: Payer: Self-pay | Admitting: Internal Medicine

## 2023-05-05 NOTE — Assessment & Plan Note (Signed)
Stable overall, cont current tx

## 2023-05-05 NOTE — Assessment & Plan Note (Signed)
Lab Results  Component Value Date   HGBA1C 6.2 (H) 01/08/2023   Stable, pt to continue current medical treatment metformin ER 500 gm - 1 bid, actos 45 qd

## 2023-05-05 NOTE — Assessment & Plan Note (Signed)
For cxr 

## 2023-05-05 NOTE — Assessment & Plan Note (Signed)
BP Readings from Last 3 Encounters:  05/04/23 122/76  01/08/23 126/82  10/07/21 (!) 148/78   Stable, pt to continue medical treatment amloipind 10 every day, hct 12.5 Every day, avapro 300 qd

## 2023-05-07 ENCOUNTER — Telehealth: Payer: Self-pay | Admitting: Internal Medicine

## 2023-05-07 DIAGNOSIS — Z9181 History of falling: Secondary | ICD-10-CM | POA: Diagnosis not present

## 2023-05-07 DIAGNOSIS — Z79899 Other long term (current) drug therapy: Secondary | ICD-10-CM | POA: Diagnosis not present

## 2023-05-07 DIAGNOSIS — R030 Elevated blood-pressure reading, without diagnosis of hypertension: Secondary | ICD-10-CM | POA: Diagnosis not present

## 2023-05-07 DIAGNOSIS — M545 Low back pain, unspecified: Secondary | ICD-10-CM | POA: Diagnosis not present

## 2023-05-07 DIAGNOSIS — G8929 Other chronic pain: Secondary | ICD-10-CM | POA: Diagnosis not present

## 2023-05-07 NOTE — Telephone Encounter (Signed)
Called and let Daughter know, form is ready for pickup.

## 2023-05-07 NOTE — Telephone Encounter (Signed)
Patient's daughter called and said patient was seen on 05/04/23 and they provided a form to be completed. They informed the clinic staff that it is due today and it was told it would be labeled as urgent. She would like for it to be completed ASAP. Bonita Quin would like to receive a call when it is completed at [phone].

## 2023-05-11 DIAGNOSIS — Z79899 Other long term (current) drug therapy: Secondary | ICD-10-CM | POA: Diagnosis not present

## 2023-06-04 DIAGNOSIS — E119 Type 2 diabetes mellitus without complications: Secondary | ICD-10-CM | POA: Diagnosis not present

## 2023-06-04 DIAGNOSIS — G8929 Other chronic pain: Secondary | ICD-10-CM | POA: Diagnosis not present

## 2023-06-04 DIAGNOSIS — Z9181 History of falling: Secondary | ICD-10-CM | POA: Diagnosis not present

## 2023-06-04 DIAGNOSIS — M545 Low back pain, unspecified: Secondary | ICD-10-CM | POA: Diagnosis not present

## 2023-06-04 DIAGNOSIS — N1831 Chronic kidney disease, stage 3a: Secondary | ICD-10-CM | POA: Diagnosis not present

## 2023-06-04 DIAGNOSIS — Z79899 Other long term (current) drug therapy: Secondary | ICD-10-CM | POA: Diagnosis not present

## 2023-06-07 DIAGNOSIS — Z79899 Other long term (current) drug therapy: Secondary | ICD-10-CM | POA: Diagnosis not present

## 2023-07-02 DIAGNOSIS — M545 Low back pain, unspecified: Secondary | ICD-10-CM | POA: Diagnosis not present

## 2023-07-02 DIAGNOSIS — N1832 Chronic kidney disease, stage 3b: Secondary | ICD-10-CM | POA: Diagnosis not present

## 2023-07-02 DIAGNOSIS — Z9181 History of falling: Secondary | ICD-10-CM | POA: Diagnosis not present

## 2023-07-02 DIAGNOSIS — Z79899 Other long term (current) drug therapy: Secondary | ICD-10-CM | POA: Diagnosis not present

## 2023-07-02 DIAGNOSIS — G8929 Other chronic pain: Secondary | ICD-10-CM | POA: Diagnosis not present

## 2023-07-02 DIAGNOSIS — Z0000 Encounter for general adult medical examination without abnormal findings: Secondary | ICD-10-CM | POA: Diagnosis not present

## 2023-07-02 DIAGNOSIS — E1122 Type 2 diabetes mellitus with diabetic chronic kidney disease: Secondary | ICD-10-CM | POA: Diagnosis not present

## 2023-07-07 DIAGNOSIS — Z79899 Other long term (current) drug therapy: Secondary | ICD-10-CM | POA: Diagnosis not present

## 2023-07-30 DIAGNOSIS — Z79899 Other long term (current) drug therapy: Secondary | ICD-10-CM | POA: Diagnosis not present

## 2023-07-30 DIAGNOSIS — Z9181 History of falling: Secondary | ICD-10-CM | POA: Diagnosis not present

## 2023-07-30 DIAGNOSIS — M545 Low back pain, unspecified: Secondary | ICD-10-CM | POA: Diagnosis not present

## 2023-07-30 DIAGNOSIS — N1832 Chronic kidney disease, stage 3b: Secondary | ICD-10-CM | POA: Diagnosis not present

## 2023-07-30 DIAGNOSIS — G8929 Other chronic pain: Secondary | ICD-10-CM | POA: Diagnosis not present

## 2023-07-30 DIAGNOSIS — E1122 Type 2 diabetes mellitus with diabetic chronic kidney disease: Secondary | ICD-10-CM | POA: Diagnosis not present

## 2023-08-03 DIAGNOSIS — Z79899 Other long term (current) drug therapy: Secondary | ICD-10-CM | POA: Diagnosis not present

## 2023-08-27 DIAGNOSIS — G8929 Other chronic pain: Secondary | ICD-10-CM | POA: Diagnosis not present

## 2023-08-27 DIAGNOSIS — E7800 Pure hypercholesterolemia, unspecified: Secondary | ICD-10-CM | POA: Diagnosis not present

## 2023-08-27 DIAGNOSIS — Z79899 Other long term (current) drug therapy: Secondary | ICD-10-CM | POA: Diagnosis not present

## 2023-08-27 DIAGNOSIS — M545 Low back pain, unspecified: Secondary | ICD-10-CM | POA: Diagnosis not present

## 2023-08-27 DIAGNOSIS — E559 Vitamin D deficiency, unspecified: Secondary | ICD-10-CM | POA: Diagnosis not present

## 2023-08-27 DIAGNOSIS — M129 Arthropathy, unspecified: Secondary | ICD-10-CM | POA: Diagnosis not present

## 2023-08-27 DIAGNOSIS — Z9181 History of falling: Secondary | ICD-10-CM | POA: Diagnosis not present

## 2023-08-27 DIAGNOSIS — N1832 Chronic kidney disease, stage 3b: Secondary | ICD-10-CM | POA: Diagnosis not present

## 2023-08-27 DIAGNOSIS — E1122 Type 2 diabetes mellitus with diabetic chronic kidney disease: Secondary | ICD-10-CM | POA: Diagnosis not present

## 2023-09-01 DIAGNOSIS — Z79899 Other long term (current) drug therapy: Secondary | ICD-10-CM | POA: Diagnosis not present

## 2023-09-24 DIAGNOSIS — Z79899 Other long term (current) drug therapy: Secondary | ICD-10-CM | POA: Diagnosis not present

## 2023-09-24 DIAGNOSIS — Z9181 History of falling: Secondary | ICD-10-CM | POA: Diagnosis not present

## 2023-09-24 DIAGNOSIS — M545 Low back pain, unspecified: Secondary | ICD-10-CM | POA: Diagnosis not present

## 2023-09-24 DIAGNOSIS — I10 Essential (primary) hypertension: Secondary | ICD-10-CM | POA: Diagnosis not present

## 2023-09-24 DIAGNOSIS — G8929 Other chronic pain: Secondary | ICD-10-CM | POA: Diagnosis not present

## 2023-10-04 DIAGNOSIS — Z79899 Other long term (current) drug therapy: Secondary | ICD-10-CM | POA: Diagnosis not present

## 2023-10-20 DIAGNOSIS — Z79899 Other long term (current) drug therapy: Secondary | ICD-10-CM | POA: Diagnosis not present

## 2023-10-20 DIAGNOSIS — M545 Low back pain, unspecified: Secondary | ICD-10-CM | POA: Diagnosis not present

## 2023-10-20 DIAGNOSIS — G8929 Other chronic pain: Secondary | ICD-10-CM | POA: Diagnosis not present

## 2023-10-20 DIAGNOSIS — E1122 Type 2 diabetes mellitus with diabetic chronic kidney disease: Secondary | ICD-10-CM | POA: Diagnosis not present

## 2023-10-20 DIAGNOSIS — N1832 Chronic kidney disease, stage 3b: Secondary | ICD-10-CM | POA: Diagnosis not present

## 2023-10-20 DIAGNOSIS — Z9181 History of falling: Secondary | ICD-10-CM | POA: Diagnosis not present

## 2023-10-21 DIAGNOSIS — Z79899 Other long term (current) drug therapy: Secondary | ICD-10-CM | POA: Diagnosis not present

## 2023-10-26 ENCOUNTER — Other Ambulatory Visit: Payer: Self-pay | Admitting: Internal Medicine

## 2023-10-26 MED ORDER — OLANZAPINE 5 MG PO TABS: 5.0000 mg | ORAL_TABLET | Freq: Every day | ORAL | 1 refills | Status: DC

## 2023-10-26 MED ORDER — BUPROPION HCL ER (XL) 300 MG PO TB24: 300.0000 mg | ORAL_TABLET | Freq: Every day | ORAL | 3 refills | Status: DC

## 2023-10-26 NOTE — Telephone Encounter (Signed)
 Ok I did rx for zyprexa   - ok for early refill

## 2023-10-26 NOTE — Telephone Encounter (Signed)
 Copied from CRM [id number]. Topic: Clinical - Prescription Issue >> Oct 26, 2023 10:33 AM Luane Rumps D wrote: Reason for CRM: Patient's daughter called regarding the refill request of Refused OLANZapine  5 mg Oral Daily. Refusal reason: refill requested too soon. Patient's daughter said they are going out of town 5/9 and she is going to run out on her trip, she is wondering if he can allow it. Also notified patient if she is still out of town when it runs out she could request then at a pharmacy close by.

## 2023-10-26 NOTE — Telephone Encounter (Signed)
 Copied from CRM [id number]. Topic: Clinical - Medication Refill >> Oct 26, 2023 10:29 AM Laird Pih wrote: Most Recent Primary Care Visit:  Provider: Roslyn Coombe  Department: [facility]  Visit Type: OFFICE VISIT  Date: 05/04/2023  Medication: buPROPion  (WELLBUTRIN  XL) 300 MG 24 hr tablet  Has the patient contacted their pharmacy? Yes (Agent: If no, request that the patient contact the pharmacy for the refill. If patient does not wish to contact the pharmacy document the reason why and proceed with request.) (Agent: If yes, when and what did the pharmacy advise?)  Is this the correct pharmacy for this prescription? Yes If no, delete pharmacy and type the correct one.  This is the patient's preferred pharmacy:   [facility] [phone] Jonette Nestle, [place] - [address] [place] [address] Phone: [phone] Fax: [phone]  Has the prescription been filled recently? No  Is the patient out of the medication? No  Has the patient been seen for an appointment in the last year OR does the patient have an upcoming appointment? Yes  Can we respond through MyChart? No  Agent: Please be advised that Rx refills may take up to 3 business days. We ask that you follow-up with your pharmacy.

## 2023-11-17 DIAGNOSIS — Z9181 History of falling: Secondary | ICD-10-CM | POA: Diagnosis not present

## 2023-11-17 DIAGNOSIS — Z79899 Other long term (current) drug therapy: Secondary | ICD-10-CM | POA: Diagnosis not present

## 2023-11-17 DIAGNOSIS — E1122 Type 2 diabetes mellitus with diabetic chronic kidney disease: Secondary | ICD-10-CM | POA: Diagnosis not present

## 2023-11-17 DIAGNOSIS — G8929 Other chronic pain: Secondary | ICD-10-CM | POA: Diagnosis not present

## 2023-11-17 DIAGNOSIS — N1832 Chronic kidney disease, stage 3b: Secondary | ICD-10-CM | POA: Diagnosis not present

## 2023-11-17 DIAGNOSIS — M545 Low back pain, unspecified: Secondary | ICD-10-CM | POA: Diagnosis not present

## 2023-11-17 DIAGNOSIS — M79602 Pain in left arm: Secondary | ICD-10-CM | POA: Diagnosis not present

## 2023-11-18 DIAGNOSIS — Z79899 Other long term (current) drug therapy: Secondary | ICD-10-CM | POA: Diagnosis not present

## 2023-11-29 ENCOUNTER — Other Ambulatory Visit: Payer: Self-pay | Admitting: Internal Medicine

## 2023-11-30 ENCOUNTER — Other Ambulatory Visit: Payer: Self-pay

## 2023-12-13 ENCOUNTER — Ambulatory Visit

## 2023-12-13 ENCOUNTER — Ambulatory Visit: Admitting: Internal Medicine

## 2023-12-15 DIAGNOSIS — M79602 Pain in left arm: Secondary | ICD-10-CM | POA: Diagnosis not present

## 2023-12-15 DIAGNOSIS — Z79899 Other long term (current) drug therapy: Secondary | ICD-10-CM | POA: Diagnosis not present

## 2023-12-15 DIAGNOSIS — E1122 Type 2 diabetes mellitus with diabetic chronic kidney disease: Secondary | ICD-10-CM | POA: Diagnosis not present

## 2023-12-15 DIAGNOSIS — R809 Proteinuria, unspecified: Secondary | ICD-10-CM | POA: Diagnosis not present

## 2023-12-15 DIAGNOSIS — M47816 Spondylosis without myelopathy or radiculopathy, lumbar region: Secondary | ICD-10-CM | POA: Diagnosis not present

## 2023-12-15 DIAGNOSIS — N1832 Chronic kidney disease, stage 3b: Secondary | ICD-10-CM | POA: Diagnosis not present

## 2023-12-15 DIAGNOSIS — Z9181 History of falling: Secondary | ICD-10-CM | POA: Diagnosis not present

## 2023-12-17 DIAGNOSIS — Z79899 Other long term (current) drug therapy: Secondary | ICD-10-CM | POA: Diagnosis not present

## 2024-01-04 ENCOUNTER — Ambulatory Visit: Admitting: Internal Medicine

## 2024-01-11 DIAGNOSIS — N1832 Chronic kidney disease, stage 3b: Secondary | ICD-10-CM | POA: Diagnosis not present

## 2024-01-11 DIAGNOSIS — Z79899 Other long term (current) drug therapy: Secondary | ICD-10-CM | POA: Diagnosis not present

## 2024-01-11 DIAGNOSIS — R809 Proteinuria, unspecified: Secondary | ICD-10-CM | POA: Diagnosis not present

## 2024-01-11 DIAGNOSIS — E1122 Type 2 diabetes mellitus with diabetic chronic kidney disease: Secondary | ICD-10-CM | POA: Diagnosis not present

## 2024-01-11 DIAGNOSIS — M79602 Pain in left arm: Secondary | ICD-10-CM | POA: Diagnosis not present

## 2024-01-11 DIAGNOSIS — Z9181 History of falling: Secondary | ICD-10-CM | POA: Diagnosis not present

## 2024-01-11 DIAGNOSIS — M47816 Spondylosis without myelopathy or radiculopathy, lumbar region: Secondary | ICD-10-CM | POA: Diagnosis not present

## 2024-01-13 DIAGNOSIS — Z79899 Other long term (current) drug therapy: Secondary | ICD-10-CM | POA: Diagnosis not present

## 2024-01-16 ENCOUNTER — Other Ambulatory Visit: Payer: Self-pay | Admitting: Internal Medicine

## 2024-01-19 ENCOUNTER — Ambulatory Visit

## 2024-01-19 ENCOUNTER — Encounter: Admitting: Internal Medicine

## 2024-01-22 ENCOUNTER — Other Ambulatory Visit: Payer: Self-pay | Admitting: Internal Medicine

## 2024-01-31 ENCOUNTER — Encounter: Payer: Self-pay | Admitting: Internal Medicine

## 2024-02-08 DIAGNOSIS — Z9181 History of falling: Secondary | ICD-10-CM | POA: Diagnosis not present

## 2024-02-08 DIAGNOSIS — M79602 Pain in left arm: Secondary | ICD-10-CM | POA: Diagnosis not present

## 2024-02-08 DIAGNOSIS — M47816 Spondylosis without myelopathy or radiculopathy, lumbar region: Secondary | ICD-10-CM | POA: Diagnosis not present

## 2024-02-08 DIAGNOSIS — Z79899 Other long term (current) drug therapy: Secondary | ICD-10-CM | POA: Diagnosis not present

## 2024-02-08 DIAGNOSIS — I10 Essential (primary) hypertension: Secondary | ICD-10-CM | POA: Diagnosis not present

## 2024-02-17 ENCOUNTER — Encounter: Admitting: Internal Medicine

## 2024-02-17 ENCOUNTER — Ambulatory Visit

## 2024-03-01 ENCOUNTER — Other Ambulatory Visit: Payer: Self-pay | Admitting: Internal Medicine

## 2024-03-03 ENCOUNTER — Ambulatory Visit

## 2024-03-07 ENCOUNTER — Other Ambulatory Visit: Payer: Self-pay | Admitting: Internal Medicine

## 2024-03-10 DIAGNOSIS — M129 Arthropathy, unspecified: Secondary | ICD-10-CM | POA: Diagnosis not present

## 2024-03-10 DIAGNOSIS — M47816 Spondylosis without myelopathy or radiculopathy, lumbar region: Secondary | ICD-10-CM | POA: Diagnosis not present

## 2024-03-10 DIAGNOSIS — N1832 Chronic kidney disease, stage 3b: Secondary | ICD-10-CM | POA: Diagnosis not present

## 2024-03-10 DIAGNOSIS — Z79899 Other long term (current) drug therapy: Secondary | ICD-10-CM | POA: Diagnosis not present

## 2024-03-10 DIAGNOSIS — E7800 Pure hypercholesterolemia, unspecified: Secondary | ICD-10-CM | POA: Diagnosis not present

## 2024-03-10 DIAGNOSIS — M79602 Pain in left arm: Secondary | ICD-10-CM | POA: Diagnosis not present

## 2024-03-10 DIAGNOSIS — E1122 Type 2 diabetes mellitus with diabetic chronic kidney disease: Secondary | ICD-10-CM | POA: Diagnosis not present

## 2024-03-10 DIAGNOSIS — Z23 Encounter for immunization: Secondary | ICD-10-CM | POA: Diagnosis not present

## 2024-03-10 DIAGNOSIS — Z9181 History of falling: Secondary | ICD-10-CM | POA: Diagnosis not present

## 2024-03-10 DIAGNOSIS — E559 Vitamin D deficiency, unspecified: Secondary | ICD-10-CM | POA: Diagnosis not present

## 2024-03-21 ENCOUNTER — Encounter: Payer: Self-pay | Admitting: Internal Medicine

## 2024-03-21 NOTE — Progress Notes (Signed)
 Pharmacy Quality Measure Review  This patient is appearing on a report for being at risk of failing the adherence measure for hypertension (ACEi/ARB) medications this calendar year.   Medication: Irbesartan  300mg  Last fill date: 09/06 for 90 day supply  Insurance report was not up to date. No action needed at this time.   Angela Baalmann, PharmD [facility] Pharmacist

## 2024-04-07 DIAGNOSIS — M79602 Pain in left arm: Secondary | ICD-10-CM | POA: Diagnosis not present

## 2024-04-07 DIAGNOSIS — M47816 Spondylosis without myelopathy or radiculopathy, lumbar region: Secondary | ICD-10-CM | POA: Diagnosis not present

## 2024-04-07 DIAGNOSIS — Z9181 History of falling: Secondary | ICD-10-CM | POA: Diagnosis not present

## 2024-04-07 DIAGNOSIS — E1122 Type 2 diabetes mellitus with diabetic chronic kidney disease: Secondary | ICD-10-CM | POA: Diagnosis not present

## 2024-04-07 DIAGNOSIS — Z79899 Other long term (current) drug therapy: Secondary | ICD-10-CM | POA: Diagnosis not present

## 2024-04-07 DIAGNOSIS — N1832 Chronic kidney disease, stage 3b: Secondary | ICD-10-CM | POA: Diagnosis not present

## 2024-04-10 DIAGNOSIS — Z79899 Other long term (current) drug therapy: Secondary | ICD-10-CM | POA: Diagnosis not present

## 2024-04-13 ENCOUNTER — Ambulatory Visit

## 2024-04-13 ENCOUNTER — Encounter: Admitting: Internal Medicine

## 2024-04-28 ENCOUNTER — Encounter: Payer: Self-pay | Admitting: Pharmacist

## 2024-04-28 NOTE — Progress Notes (Signed)
 Pharmacy Quality Measure Review  This patient is appearing on a report for being at risk of failing the adherence measure for hypertension (ACEi/ARB) medications this calendar year.   Medication: irbesartan  Last fill date: 02/26/24 for 90 day supply  Medication: rosuvastatin  Last fill date: 03/02/24 for 90 day supply  Insurance report was not up to date. No action needed at this time.   Darrelyn Drum, PharmD, BCPS, CPP Clinical Pharmacist Practitioner [facility] [phone]

## 2024-05-12 ENCOUNTER — Encounter: Admitting: Internal Medicine

## 2024-05-21 ENCOUNTER — Other Ambulatory Visit: Payer: Self-pay | Admitting: Internal Medicine

## 2024-06-10 ENCOUNTER — Other Ambulatory Visit: Payer: Self-pay | Admitting: Internal Medicine

## 2024-06-20 ENCOUNTER — Encounter: Admitting: Internal Medicine

## 2024-06-20 ENCOUNTER — Ambulatory Visit

## 2024-06-26 ENCOUNTER — Telehealth: Payer: Self-pay

## 2024-06-26 ENCOUNTER — Telehealth: Payer: Self-pay | Admitting: Internal Medicine

## 2024-06-26 NOTE — Telephone Encounter (Signed)
 Patient left form that needs to be filled out and faxed to [employer] - form up front in Dr. Nicola box

## 2024-06-26 NOTE — Telephone Encounter (Signed)
Will be on the lookout for the forms.

## 2024-06-26 NOTE — Telephone Encounter (Signed)
 Copied from CRM [id number]. Topic: General - Other >> Jun 26, 2024 10:23 AM Mercedes MATSU wrote: Reason for CRM: Patient called saying she will stop by the office to drop some forms off for her doctor to be filled out. Its forms for her to use her benefit card. Patient states after she drops the forms off she would like a call back once forms are ready for pick up.  CB: [phone] (Cell) [phone] )home)

## 2024-06-27 NOTE — Telephone Encounter (Signed)
 Form has placed on provider desk.

## 2024-06-27 NOTE — Telephone Encounter (Signed)
 Form has been received & placed on provider desk.

## 2024-06-28 NOTE — Telephone Encounter (Signed)
The form has been faxed today.

## 2024-06-28 NOTE — Telephone Encounter (Signed)
 Called and let Pt know the form was ready & Pt stated she wanted it fax instead of pick up, The form has been faxed.

## 2024-07-07 ENCOUNTER — Ambulatory Visit: Admitting: Internal Medicine

## 2024-07-07 ENCOUNTER — Telehealth: Payer: Self-pay | Admitting: Internal Medicine

## 2024-07-07 ENCOUNTER — Encounter: Payer: Self-pay | Admitting: Internal Medicine

## 2024-07-07 ENCOUNTER — Ambulatory Visit: Payer: Self-pay

## 2024-07-07 VITALS — BP 122/70 | HR 67 | Temp 98.3°F | Ht 59.0 in | Wt 129.0 lb

## 2024-07-07 DIAGNOSIS — Z0001 Encounter for general adult medical examination with abnormal findings: Secondary | ICD-10-CM

## 2024-07-07 DIAGNOSIS — E1122 Type 2 diabetes mellitus with diabetic chronic kidney disease: Secondary | ICD-10-CM | POA: Diagnosis not present

## 2024-07-07 DIAGNOSIS — E559 Vitamin D deficiency, unspecified: Secondary | ICD-10-CM | POA: Diagnosis not present

## 2024-07-07 DIAGNOSIS — Z7984 Long term (current) use of oral hypoglycemic drugs: Secondary | ICD-10-CM | POA: Diagnosis not present

## 2024-07-07 DIAGNOSIS — Z0000 Encounter for general adult medical examination without abnormal findings: Secondary | ICD-10-CM

## 2024-07-07 DIAGNOSIS — I10 Essential (primary) hypertension: Secondary | ICD-10-CM

## 2024-07-07 DIAGNOSIS — N1831 Chronic kidney disease, stage 3a: Secondary | ICD-10-CM | POA: Diagnosis not present

## 2024-07-07 DIAGNOSIS — E118 Type 2 diabetes mellitus with unspecified complications: Secondary | ICD-10-CM

## 2024-07-07 DIAGNOSIS — E538 Deficiency of other specified B group vitamins: Secondary | ICD-10-CM | POA: Diagnosis not present

## 2024-07-07 DIAGNOSIS — E785 Hyperlipidemia, unspecified: Secondary | ICD-10-CM

## 2024-07-07 LAB — CBC WITH DIFFERENTIAL/PLATELET
Basophils Absolute: 0.1 K/uL (ref 0.0–0.1)
Basophils Relative: 1.2 % (ref 0.0–3.0)
Eosinophils Absolute: 0.1 K/uL (ref 0.0–0.7)
Eosinophils Relative: 1.6 % (ref 0.0–5.0)
HCT: 30.2 % — ABNORMAL LOW (ref 36.0–46.0)
Hemoglobin: 10 g/dL — ABNORMAL LOW (ref 12.0–15.0)
Lymphocytes Relative: 22.6 % (ref 12.0–46.0)
Lymphs Abs: 1.1 K/uL (ref 0.7–4.0)
MCHC: 33.1 g/dL (ref 30.0–36.0)
MCV: 89.8 fl (ref 78.0–100.0)
Monocytes Absolute: 0.4 K/uL (ref 0.1–1.0)
Monocytes Relative: 7.5 % (ref 3.0–12.0)
Neutro Abs: 3.2 K/uL (ref 1.4–7.7)
Neutrophils Relative %: 67.1 % (ref 43.0–77.0)
Platelets: 189 K/uL (ref 150.0–400.0)
RBC: 3.36 Mil/uL — ABNORMAL LOW (ref 3.87–5.11)
RDW: 15.2 % (ref 11.5–15.5)
WBC: 4.7 K/uL (ref 4.0–10.5)

## 2024-07-07 LAB — BASIC METABOLIC PANEL WITH GFR
BUN: 26 mg/dL — ABNORMAL HIGH (ref 6–23)
CO2: 27 meq/L (ref 19–32)
Calcium: 9.3 mg/dL (ref 8.4–10.5)
Chloride: 104 meq/L (ref 96–112)
Creatinine, Ser: 1.9 mg/dL — ABNORMAL HIGH (ref 0.40–1.20)
GFR: 24.27 mL/min — ABNORMAL LOW
Glucose, Bld: 124 mg/dL — ABNORMAL HIGH (ref 70–99)
Potassium: 4.2 meq/L (ref 3.5–5.1)
Sodium: 140 meq/L (ref 135–145)

## 2024-07-07 LAB — URINALYSIS, ROUTINE W REFLEX MICROSCOPIC
Bilirubin Urine: NEGATIVE
Hgb urine dipstick: NEGATIVE
Ketones, ur: NEGATIVE
Leukocytes,Ua: NEGATIVE
Nitrite: NEGATIVE
Specific Gravity, Urine: 1.01 (ref 1.000–1.030)
Total Protein, Urine: NEGATIVE
Urine Glucose: NEGATIVE
Urobilinogen, UA: 0.2 (ref 0.0–1.0)
pH: 6.5 (ref 5.0–8.0)

## 2024-07-07 LAB — HEPATIC FUNCTION PANEL
ALT: 21 U/L (ref 3–35)
AST: 28 U/L (ref 5–37)
Albumin: 4.4 g/dL (ref 3.5–5.2)
Alkaline Phosphatase: 50 U/L (ref 39–117)
Bilirubin, Direct: 0.2 mg/dL (ref 0.1–0.3)
Total Bilirubin: 0.8 mg/dL (ref 0.2–1.2)
Total Protein: 7.1 g/dL (ref 6.0–8.3)

## 2024-07-07 LAB — LIPID PANEL
Cholesterol: 123 mg/dL (ref 28–200)
HDL: 79.6 mg/dL
LDL Cholesterol: 32 mg/dL (ref 10–99)
NonHDL: 43.66
Total CHOL/HDL Ratio: 2
Triglycerides: 59 mg/dL (ref 10.0–149.0)
VLDL: 11.8 mg/dL (ref 0.0–40.0)

## 2024-07-07 LAB — TSH: TSH: 2.86 u[IU]/mL (ref 0.35–5.50)

## 2024-07-07 LAB — MICROALBUMIN / CREATININE URINE RATIO
Creatinine,U: 76.8 mg/dL
Microalb Creat Ratio: 27.5 mg/g (ref 0.0–30.0)
Microalb, Ur: 2.1 mg/dL — ABNORMAL HIGH (ref 0.7–1.9)

## 2024-07-07 LAB — HEMOGLOBIN A1C: Hgb A1c MFr Bld: 6.3 % (ref 4.6–6.5)

## 2024-07-07 LAB — VITAMIN B12: Vitamin B-12: 743 pg/mL (ref 211–911)

## 2024-07-07 LAB — VITAMIN D 25 HYDROXY (VIT D DEFICIENCY, FRACTURES): VITD: >120 ng/mL

## 2024-07-07 MED ORDER — ROSUVASTATIN CALCIUM 10 MG PO TABS: 10.0000 mg | ORAL_TABLET | Freq: Every day | ORAL | 3 refills | Status: DC

## 2024-07-07 MED ORDER — METFORMIN HCL ER 500 MG PO TB24: 500.0000 mg | ORAL_TABLET | Freq: Two times a day (BID) | ORAL | 0 refills | Status: DC

## 2024-07-07 MED ORDER — HYDROCHLOROTHIAZIDE 12.5 MG PO CAPS: 12.5000 mg | ORAL_CAPSULE | Freq: Every day | ORAL | 3 refills | Status: DC

## 2024-07-07 MED ORDER — FLUOXETINE HCL 40 MG PO CAPS: 40.0000 mg | ORAL_CAPSULE | Freq: Every day | ORAL | 3 refills | Status: DC

## 2024-07-07 MED ORDER — ALENDRONATE SODIUM 70 MG PO TABS: 70.0000 mg | ORAL_TABLET | ORAL | 3 refills | Status: DC

## 2024-07-07 MED ORDER — BUPROPION HCL ER (XL) 300 MG PO TB24: 300.0000 mg | ORAL_TABLET | Freq: Every day | ORAL | 3 refills | Status: DC

## 2024-07-07 MED ORDER — AMLODIPINE BESYLATE 10 MG PO TABS: 10.0000 mg | ORAL_TABLET | Freq: Every day | ORAL | 3 refills | Status: DC

## 2024-07-07 MED ORDER — IRBESARTAN 300 MG PO TABS: 300.0000 mg | ORAL_TABLET | Freq: Every day | ORAL | 3 refills | Status: DC

## 2024-07-07 MED ORDER — OLANZAPINE 5 MG PO TABS: 5.0000 mg | ORAL_TABLET | Freq: Every day | ORAL | 1 refills | Status: DC

## 2024-07-07 MED ORDER — PIOGLITAZONE HCL 45 MG PO TABS: 45.0000 mg | ORAL_TABLET | Freq: Every day | ORAL | 3 refills | Status: DC

## 2024-07-07 NOTE — Telephone Encounter (Addendum)
 Unable to reach daughter by mobile phone, and no answer at home phone  [facility] with daughter mobile number - pt has severe renal insufficiency for unclear reason, and should go now to ED for further evaluation.    I will try again later

## 2024-07-07 NOTE — Telephone Encounter (Signed)
 On Call Dr. Joyce called back and received critical lab result.

## 2024-07-07 NOTE — Patient Instructions (Signed)

## 2024-07-07 NOTE — Progress Notes (Signed)
 Patient ID: Latasha Wang, female   DOB: [birth year], 84 y.o.   MRN: [id number]         Chief Complaint:: wellness exam and hld, htn, low b12, low vit D       HPI:  Latasha Wang is a 84 y.o. female here for wellness exam with niece; for shingrix at pharmacy,. o/w up to date                        Also Pt denies chest pain, increased sob or doe, wheezing, orthopnea, PND, increased LE swelling, palpitations, dizziness or syncope.  Pt denies polydipsia, polyuria, or new focal neuro s/s.    Pt denies fever, wt loss, night sweats, loss of appetite, or other constitutional symptoms  Denies worsening depressive symptoms, suicidal ideation, or panic, Dementia overall stable symptomatically, and not assoc with behavioral changes such as paranoia, or agitation. Pt states no complaints today Wt Readings from Last 3 Encounters:  07/07/24 129 lb (58.5 kg)  05/04/23 139 lb (63 kg)  01/08/23 146 lb (66.2 kg)   BP Readings from Last 3 Encounters:  07/08/24 (!) 145/59  07/07/24 122/70  05/04/23 122/76   Immunization History  Administered Date(s) Administered   Fluad Quad(high Dose 65+) 04/06/2023, 05/11/2024   INFLUENZA, HIGH DOSE SEASONAL PF 06/09/2016, 06/10/2017, 06/13/2018   Influenza Whole 05/06/2007, 03/26/2008   Influenza-Unspecified 03/22/2014   PFIZER Comirnaty(Gray Top)Covid-19 Tri-Sucrose Vaccine 03/22/2021   PPD Test 03/18/2016   Pneumococcal Conjugate-13 06/13/2013, 12/12/2013   Pneumococcal Polysaccharide-23 12/13/2006   Td 12/17/2008   Tdap 12/19/2018, 12/19/2018   Health Maintenance Due  Topic Date Due   Zoster Vaccines- Shingrix (1 of 2) Never done   Medicare Annual Wellness (AWV)  07/01/2024      Past Medical History:  Diagnosis Date   ANXIETY    Dementia (HCC) 07/24/2020   DEPRESSION    Diabetes mellitus    Essential hypertension 05/09/2007   Qualifier: Diagnosis of  By: Georgian ROSALEA CHARM Lamar    FATTY LIVER DISEASE    Gastroparesis    GERD    HYPERLIPIDEMIA     HYPERTENSION    LATERAL EPICONDYLITIS, RIGHT    Past Surgical History:  Procedure Laterality Date   CATARACT EXTRACTION, BILATERAL     TOTAL ELBOW ARTHROPLASTY  02/19/2012   Procedure: TOTAL ELBOW ARTHROPLASTY;  Surgeon: Ozell VEAR Bruch, MD;  Location: [facility] OR;  Service: Orthopedics;  Laterality: Left;    reports that she has never smoked. She has never used smokeless tobacco. She reports that she does not drink alcohol and does not use drugs. family history is not on file. Allergies[1] Medications Ordered Prior to Encounter[2]      ROS:  All others reviewed and negative.  Objective        PE:  BP 122/70 (BP Location: Left Arm, Patient Position: Sitting, Cuff Size: Normal)   Pulse 67   Temp 98.3 F (36.8 C) (Oral)   Ht 4' 11 (1.499 m)   Wt 129 lb (58.5 kg)   SpO2 97%   BMI 26.05 kg/m                 Constitutional: Pt appears in NAD               HENT: Head: NCAT.                Right Ear: External ear normal.  Left Ear: External ear normal.                Eyes: . Pupils are equal, round, and reactive to light. Conjunctivae and EOM are normal               Nose: without d/c or deformity               Neck: Neck supple. Gross normal ROM               Cardiovascular: Normal rate and regular rhythm.                 Pulmonary/Chest: Effort normal and breath sounds without rales or wheezing.                Abd:  Soft, NT, ND, + BS, no organomegaly               Neurological: Pt is alert. At baseline orientation, motor grossly intact               Skin: Skin is warm. No rashes, no other new lesions, LE edema - none               Psychiatric: Pt behavior is normal without agitation   Micro: none  Cardiac tracings I have personally interpreted today:  none  Pertinent Radiological findings (summarize): none   Lab Results  Component Value Date   WBC 5.4 07/08/2024   HGB 9.8 (L) 07/08/2024   HCT 30.1 (L) 07/08/2024   PLT 193 07/08/2024   GLUCOSE 114 (H)  07/08/2024   CHOL 123 07/07/2024   TRIG 59.0 07/07/2024   HDL 79.60 07/07/2024   LDLDIRECT 85.1 08/19/2006   LDLCALC 32 07/07/2024   ALT 21 07/07/2024   AST 28 07/07/2024   NA 141 07/08/2024   K 4.3 07/08/2024   CL 104 07/08/2024   CREATININE 1.90 (H) 07/08/2024   BUN 24 (H) 07/08/2024   CO2 27 07/08/2024   TSH 2.86 07/07/2024   INR 0.91 02/19/2012   HGBA1C 6.3 07/07/2024   MICROALBUR 2.1 (H) 07/07/2024   Assessment/Plan:  Latasha Wang is a 84 y.o. Asian [4] female with  has a past medical history of ANXIETY, Dementia (HCC) (07/24/2020), DEPRESSION, Diabetes mellitus, Essential hypertension (05/09/2007), FATTY LIVER DISEASE, Gastroparesis, GERD, HYPERLIPIDEMIA, HYPERTENSION, and LATERAL EPICONDYLITIS, RIGHT.  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 I have personally reviewed and have noted: 1) the patient's medical and social history 2) The patient's current medications and supplements 3) The patient's height, weight, and BMI have been recorded in the chart   Hyperlipidemia Lab Results  Component Value Date   LDLCALC 32 07/07/2024   Stable, pt to continue current statin crestor  10 mg qd   Essential hypertension BP Readings from Last 3 Encounters:  07/08/24 (!) 145/59  07/07/24 122/70  05/04/23 122/76   Stable today, pt to continue medical treatment norvasc  10 every day, hct 12.5, avapro  300 qd   Vitamin D   deficiency Last vitamin D  Lab Results   Component Value Date   VD25OH >55 (HH) 07/07/2024   overcontrolled, pt to hold vit d for now  B12 deficiency Lab Results  Component Value Date   VITAMINB12 743 07/07/2024   Stable, cont oral replacement - b12 1000 mcg qd  Followup: Return in about 6 months (around 01/04/2025).  Latasha Rush, MD 07/08/2024 4:29 PM [facility] [facility]     [1] No Known Allergies [2]  No current facility-administered medications on file prior to visit.   Current Outpatient Medications on File Prior to Visit  Medication Sig Dispense Refill   acetaminophen  (TYLENOL ) 500 MG tablet Take 1-2 tablets (500-1,000 mg total) by mouth every 6 (six) hours as needed for pain or fever. For pain 90 tablet 0   albuterol  (PROVENTIL  HFA;VENTOLIN  HFA) 108 (90 BASE) MCG/ACT inhaler Inhale 2 puffs into the lungs every 6 (six) hours as needed for wheezing. 1 Inhaler 0   chlorhexidine  (PERIDEX ) 0.12 % solution Use as directed 30 mLs in the mouth or throat 3 (three) times daily.     HYDROcodone -acetaminophen  (NORCO) 10-325 MG tablet Take by mouth.     meclizine  (ANTIVERT ) 12.5 MG tablet TAKE 1-2 TABLETS (12.5-25 MG TOTAL) BY MOUTH EVERY 6 (SIX) HOURS AS NEEDED. 30 tablet 2

## 2024-07-07 NOTE — Telephone Encounter (Signed)
" °  Latasha Wang Reason for Disposition  Lab or radiology calling with CRITICAL test results  Answer Assessment - Initial Assessment Questions 1. REASON FOR CALL or QUESTION: What is your reason for calling today? or How can I best     Vitamin D  greater than 120 2. CALLER: Document the source of call. (e.g., laboratory staff, caregiver or patient).     Latasha Wang- lab  Protocols used: PCP Call - No Triage-A-AH  "

## 2024-07-07 NOTE — Telephone Encounter (Signed)
 Daughter [employer] called me back  I asked her to take Ms Finkel to ED now for evaluation for severe renal insufficiency.

## 2024-07-08 ENCOUNTER — Other Ambulatory Visit: Payer: Self-pay

## 2024-07-08 ENCOUNTER — Inpatient Hospital Stay (HOSPITAL_COMMUNITY)
Admission: EM | Admit: 2024-07-08 | Discharge: 2024-07-10 | DRG: 683 | Disposition: A | Discharge status: Home / Self Care | Source: Ambulatory Visit | Attending: Internal Medicine | Admitting: Internal Medicine

## 2024-07-08 ENCOUNTER — Encounter (HOSPITAL_COMMUNITY): Payer: Self-pay | Admitting: Internal Medicine

## 2024-07-08 ENCOUNTER — Observation Stay (HOSPITAL_COMMUNITY)

## 2024-07-08 ENCOUNTER — Encounter: Payer: Self-pay | Admitting: Internal Medicine

## 2024-07-08 DIAGNOSIS — F0390 Unspecified dementia without behavioral disturbance: Secondary | ICD-10-CM | POA: Diagnosis present

## 2024-07-08 DIAGNOSIS — K219 Gastro-esophageal reflux disease without esophagitis: Secondary | ICD-10-CM | POA: Diagnosis present

## 2024-07-08 DIAGNOSIS — Z7984 Long term (current) use of oral hypoglycemic drugs: Secondary | ICD-10-CM

## 2024-07-08 DIAGNOSIS — I10 Essential (primary) hypertension: Secondary | ICD-10-CM | POA: Diagnosis present

## 2024-07-08 DIAGNOSIS — N179 Acute kidney failure, unspecified: Secondary | ICD-10-CM | POA: Diagnosis not present

## 2024-07-08 DIAGNOSIS — F32A Depression, unspecified: Secondary | ICD-10-CM | POA: Diagnosis not present

## 2024-07-08 DIAGNOSIS — N189 Chronic kidney disease, unspecified: Secondary | ICD-10-CM | POA: Diagnosis present

## 2024-07-08 DIAGNOSIS — F03A0 Unspecified dementia, mild, without behavioral disturbance, psychotic disturbance, mood disturbance, and anxiety: Secondary | ICD-10-CM | POA: Diagnosis not present

## 2024-07-08 DIAGNOSIS — Z79899 Other long term (current) drug therapy: Secondary | ICD-10-CM

## 2024-07-08 DIAGNOSIS — D649 Anemia, unspecified: Secondary | ICD-10-CM | POA: Diagnosis present

## 2024-07-08 DIAGNOSIS — F0393 Unspecified dementia, unspecified severity, with mood disturbance: Secondary | ICD-10-CM | POA: Diagnosis present

## 2024-07-08 DIAGNOSIS — F0394 Unspecified dementia, unspecified severity, with anxiety: Secondary | ICD-10-CM | POA: Diagnosis present

## 2024-07-08 DIAGNOSIS — E118 Type 2 diabetes mellitus with unspecified complications: Secondary | ICD-10-CM | POA: Diagnosis not present

## 2024-07-08 DIAGNOSIS — E860 Dehydration: Secondary | ICD-10-CM | POA: Diagnosis present

## 2024-07-08 DIAGNOSIS — E785 Hyperlipidemia, unspecified: Secondary | ICD-10-CM | POA: Diagnosis not present

## 2024-07-08 DIAGNOSIS — F411 Generalized anxiety disorder: Secondary | ICD-10-CM | POA: Diagnosis not present

## 2024-07-08 DIAGNOSIS — E1143 Type 2 diabetes mellitus with diabetic autonomic (poly)neuropathy: Secondary | ICD-10-CM | POA: Diagnosis present

## 2024-07-08 DIAGNOSIS — E1122 Type 2 diabetes mellitus with diabetic chronic kidney disease: Secondary | ICD-10-CM | POA: Diagnosis present

## 2024-07-08 DIAGNOSIS — I129 Hypertensive chronic kidney disease with stage 1 through stage 4 chronic kidney disease, or unspecified chronic kidney disease: Secondary | ICD-10-CM | POA: Diagnosis present

## 2024-07-08 DIAGNOSIS — Z7983 Long term (current) use of bisphosphonates: Secondary | ICD-10-CM

## 2024-07-08 DIAGNOSIS — K3184 Gastroparesis: Secondary | ICD-10-CM | POA: Diagnosis present

## 2024-07-08 LAB — URINALYSIS, ROUTINE W REFLEX MICROSCOPIC
Bilirubin Urine: NEGATIVE
Glucose, UA: NEGATIVE mg/dL
Hgb urine dipstick: NEGATIVE
Ketones, ur: NEGATIVE mg/dL
Leukocytes,Ua: NEGATIVE
Nitrite: NEGATIVE
Protein, ur: NEGATIVE mg/dL
Specific Gravity, Urine: 1.011 (ref 1.005–1.030)
pH: 6 (ref 5.0–8.0)

## 2024-07-08 LAB — CBC
HCT: 30.1 % — ABNORMAL LOW (ref 36.0–46.0)
Hemoglobin: 9.8 g/dL — ABNORMAL LOW (ref 12.0–15.0)
MCH: 29.9 pg (ref 26.0–34.0)
MCHC: 32.6 g/dL (ref 30.0–36.0)
MCV: 91.8 fL (ref 80.0–100.0)
Platelets: 193 K/uL (ref 150–400)
RBC: 3.28 MIL/uL — ABNORMAL LOW (ref 3.87–5.11)
RDW: 15.3 % (ref 11.5–15.5)
WBC: 5.4 K/uL (ref 4.0–10.5)
nRBC: 0 % (ref 0.0–0.2)

## 2024-07-08 LAB — BASIC METABOLIC PANEL WITH GFR
Anion gap: 11 (ref 5–15)
BUN: 24 mg/dL — ABNORMAL HIGH (ref 8–23)
CO2: 27 mmol/L (ref 22–32)
Calcium: 10.1 mg/dL (ref 8.9–10.3)
Chloride: 104 mmol/L (ref 98–111)
Creatinine, Ser: 1.9 mg/dL — ABNORMAL HIGH (ref 0.44–1.00)
GFR, Estimated: 26 mL/min — ABNORMAL LOW
Glucose, Bld: 114 mg/dL — ABNORMAL HIGH (ref 70–99)
Potassium: 4.3 mmol/L (ref 3.5–5.1)
Sodium: 141 mmol/L (ref 135–145)

## 2024-07-08 LAB — CREATININE, URINE, RANDOM: Creatinine, Urine: 94 mg/dL

## 2024-07-08 LAB — SODIUM, URINE, RANDOM: Sodium, Ur: 106 mmol/L

## 2024-07-08 LAB — GLUCOSE, CAPILLARY: Glucose-Capillary: 88 mg/dL (ref 70–99)

## 2024-07-08 MED ORDER — SODIUM CHLORIDE 0.9% FLUSH
3.0000 mL | Freq: Two times a day (BID) | INTRAVENOUS | Status: DC
  Administered 2024-07-08 – 2024-07-10 (×5): 3 mL via INTRAVENOUS

## 2024-07-08 MED ORDER — OXYCODONE HCL 5 MG PO TABS: 5.0000 mg | ORAL_TABLET | ORAL | Status: DC | PRN

## 2024-07-08 MED ORDER — ROSUVASTATIN CALCIUM 10 MG PO TABS
10.0000 mg | ORAL_TABLET | Freq: Every day | ORAL | Status: DC
  Administered 2024-07-08 – 2024-07-09 (×2): 10 mg via ORAL
  Filled 2024-07-08 (×2): qty 1

## 2024-07-08 MED ORDER — HYDRALAZINE HCL 20 MG/ML IJ SOLN: 5.0000 mg | Freq: Four times a day (QID) | INTRAMUSCULAR | Status: DC | PRN

## 2024-07-08 MED ORDER — POLYETHYLENE GLYCOL 3350 17 G PO PACK: 17.0000 g | PACK | Freq: Every day | ORAL | Status: DC | PRN

## 2024-07-08 MED ORDER — AMLODIPINE BESYLATE 10 MG PO TABS
10.0000 mg | ORAL_TABLET | Freq: Every day | ORAL | Status: DC
  Administered 2024-07-09 – 2024-07-10 (×2): 10 mg via ORAL
  Filled 2024-07-08 (×2): qty 1

## 2024-07-08 MED ORDER — SORBITOL 70 % SOLN: 30.0000 mL | Freq: Every day | Status: DC | PRN

## 2024-07-08 MED ORDER — ALBUTEROL SULFATE (2.5 MG/3ML) 0.083% IN NEBU
2.5000 mg | INHALATION_SOLUTION | Freq: Four times a day (QID) | RESPIRATORY_TRACT | Status: DC
  Administered 2024-07-09: 2.5 mg via RESPIRATORY_TRACT
  Filled 2024-07-08 (×2): qty 3

## 2024-07-08 MED ORDER — ALBUTEROL SULFATE (2.5 MG/3ML) 0.083% IN NEBU: 3.0000 mL | INHALATION_SOLUTION | Freq: Four times a day (QID) | RESPIRATORY_TRACT | Status: DC | PRN

## 2024-07-08 MED ORDER — OLANZAPINE 5 MG PO TABS
5.0000 mg | ORAL_TABLET | Freq: Every day | ORAL | Status: DC
  Administered 2024-07-09 – 2024-07-10 (×2): 5 mg via ORAL
  Filled 2024-07-08 (×3): qty 1

## 2024-07-08 MED ORDER — ENSURE PLUS HIGH PROTEIN PO LIQD
237.0000 mL | Freq: Two times a day (BID) | ORAL | Status: DC
  Administered 2024-07-10: 237 mL via ORAL

## 2024-07-08 MED ORDER — INSULIN ASPART 100 UNIT/ML IJ SOLN: 0.0000 [IU] | Freq: Every day | INTRAMUSCULAR | Status: DC

## 2024-07-08 MED ORDER — FAMOTIDINE 20 MG PO TABS
10.0000 mg | ORAL_TABLET | Freq: Every day | ORAL | Status: DC
  Administered 2024-07-08 – 2024-07-10 (×3): 10 mg via ORAL
  Filled 2024-07-08 (×3): qty 1

## 2024-07-08 MED ORDER — FAMOTIDINE 20 MG PO TABS: 20.0000 mg | ORAL_TABLET | Freq: Every day | ORAL | Status: DC

## 2024-07-08 MED ORDER — ONDANSETRON HCL 4 MG/2ML IJ SOLN: 4.0000 mg | Freq: Four times a day (QID) | INTRAMUSCULAR | Status: DC | PRN

## 2024-07-08 MED ORDER — CHLORHEXIDINE GLUCONATE 0.12 % MT SOLN
30.0000 mL | Freq: Three times a day (TID) | OROMUCOSAL | Status: DC
  Administered 2024-07-08 – 2024-07-09 (×5): 30 mL via OROMUCOSAL
  Filled 2024-07-08 (×5): qty 30

## 2024-07-08 MED ORDER — MECLIZINE HCL 25 MG PO TABS: 12.5000 mg | ORAL_TABLET | Freq: Four times a day (QID) | ORAL | Status: DC | PRN

## 2024-07-08 MED ORDER — INSULIN ASPART 100 UNIT/ML IJ SOLN: 0.0000 [IU] | Freq: Three times a day (TID) | INTRAMUSCULAR | Status: DC

## 2024-07-08 MED ORDER — FLUOXETINE HCL 20 MG PO CAPS
40.0000 mg | ORAL_CAPSULE | Freq: Every day | ORAL | Status: DC
  Administered 2024-07-09 – 2024-07-10 (×2): 40 mg via ORAL
  Filled 2024-07-08 (×2): qty 2

## 2024-07-08 MED ORDER — SODIUM CHLORIDE 0.9 % IV SOLN: INTRAVENOUS | Status: DC

## 2024-07-08 MED ORDER — HEPARIN SODIUM (PORCINE) 5000 UNIT/ML IJ SOLN
5000.0000 [IU] | Freq: Three times a day (TID) | INTRAMUSCULAR | Status: DC
  Administered 2024-07-08 – 2024-07-10 (×5): 5000 [IU] via SUBCUTANEOUS
  Filled 2024-07-08 (×5): qty 1

## 2024-07-08 MED ORDER — SODIUM CHLORIDE 0.9 % IV BOLUS
1000.0000 mL | Freq: Once | INTRAVENOUS | Status: AC
  Administered 2024-07-08: 1000 mL via INTRAVENOUS

## 2024-07-08 MED ORDER — BUPROPION HCL ER (XL) 300 MG PO TB24
300.0000 mg | ORAL_TABLET | Freq: Every day | ORAL | Status: DC
  Administered 2024-07-09 – 2024-07-10 (×2): 300 mg via ORAL
  Filled 2024-07-08 (×2): qty 1

## 2024-07-08 MED ORDER — ACETAMINOPHEN 325 MG PO TABS: 650.0000 mg | ORAL_TABLET | Freq: Four times a day (QID) | ORAL | Status: DC | PRN

## 2024-07-08 MED ORDER — ONDANSETRON HCL 4 MG PO TABS: 4.0000 mg | ORAL_TABLET | Freq: Four times a day (QID) | ORAL | Status: DC | PRN

## 2024-07-08 MED ORDER — ACETAMINOPHEN 650 MG RE SUPP: 650.0000 mg | Freq: Four times a day (QID) | RECTAL | Status: DC | PRN

## 2024-07-08 NOTE — Assessment & Plan Note (Signed)
 Lab Results  Component Value Date   VITAMINB12 743 07/07/2024   Stable, cont oral replacement - b12 1000 mcg qd

## 2024-07-08 NOTE — ED Notes (Signed)
 Patients family provided medication list: -fluoxetine  HCL 40mg  -buproprion HCL XL 300mg  tab -metformin  HCL ER 500mg  tab -amlodipine  besylate 10mg  tab -olanzapine  5mg  tab -rosuvastatin  calcium  10mg  tab -irbesartan  300mg  tab -pioglitazone  HCL 45mg  tab -hydrochlorothiaside 12.5mg  mg cp -hydrocodone  10/325mg  -multivitamin -vitamin d 

## 2024-07-08 NOTE — Assessment & Plan Note (Signed)
 Last vitamin D  Lab Results  Component Value Date   VD25OH >42 (HH) 07/07/2024   overcontrolled, pt to hold vit d for now

## 2024-07-08 NOTE — H&P (Signed)
 " History and Physical    Latasha Wang FMW:[id number] DOB: 1940/10/26 DOA: 07/08/2024  PCP: Norleen Lynwood ORN, MD  Patient coming from: Home  I have personally briefly reviewed patient's old medical records in [facility]  Chief Complaint: Abnormal labs at PCPs office  HPI: Latasha Wang is a 84 y.o. female with medical history significant of diabetes mellitus type 2, depression and anxiety, hypertension, GERD, hyperlipidemia, dementia presenting to the ED secondary to abnormal labs obtained at PCPs office.  Son at bedside who is helping with history.  Per son and patient patient went to see PCP for routine visit 1 day prior to admission, labs were obtained and patient was called the evening of 07/07/2024, 1 day prior to admission, and told that  she had an alarmingly low kidney function and needed to present to the emergency room right away. Patient denies any fevers, no chills, no nausea, no vomiting, no diarrhea, no constipation, no melena, no hematemesis, no hematochezia, no dysuria.  Patient does endorse some occasional dizziness attributed to her vertigo.  Patient also endorses increased urinary frequency at night over the past 2 months.  Patient denies any decreased appetite or change in her appetite.  ED Course: Patient seen in the ED, labs obtained with a basic metabolic profile with a BUN of 24, creatinine of 1.90 otherwise within normal limits.  CBC with a hemoglobin of 9.8 otherwise within normal limits.  Urinalysis done bland.  Review of Systems: As per HPI otherwise all other systems reviewed and are negative.  Past Medical History:  Diagnosis Date   ANXIETY    Dementia (HCC) 07/24/2020   DEPRESSION    Diabetes mellitus    Essential hypertension 05/09/2007   Qualifier: Diagnosis of  By: Georgian ROSALEA CHARM Lamar    FATTY LIVER DISEASE    Gastroparesis    GERD    HYPERLIPIDEMIA    HYPERTENSION    LATERAL EPICONDYLITIS, RIGHT     Past Surgical History:  Procedure Laterality  Date   CATARACT EXTRACTION, BILATERAL     TOTAL ELBOW ARTHROPLASTY  02/19/2012   Procedure: TOTAL ELBOW ARTHROPLASTY;  Surgeon: Ozell VEAR Bruch, MD;  Location: [facility] OR;  Service: Orthopedics;  Laterality: Left;    Social History  reports that she has never smoked. She has never used smokeless tobacco. She reports that she does not drink alcohol and does not use drugs.  Allergies[1]  Family History  Problem Relation Age of Onset   Diabetes Neg Hx    Hypertension Neg Hx    Mother deceased from suicide, patient states was younger and unsure of mother's age. Father deceased from gunshot wound.  Patient states was younger at the time and unsure of father's age.  Prior to Admission medications  Medication Sig Start Date End Date Taking? Authorizing Provider  acetaminophen  (TYLENOL ) 500 MG tablet Take 1-2 tablets (500-1,000 mg total) by mouth every 6 (six) hours as needed for pain or fever. For pain 02/19/12   Deward Eck, PA-C  albuterol  (PROVENTIL  HFA;VENTOLIN  HFA) 108 (90 BASE) MCG/ACT inhaler Inhale 2 puffs into the lungs every 6 (six) hours as needed for wheezing. 08/12/12   Inocencio Berwyn LABOR, MD  alendronate  (FOSAMAX ) 70 MG tablet Take 1 tablet (70 mg total) by mouth once a week. Take with a full glass of water on an empty stomach. 07/07/24   Norleen Lynwood ORN, MD  amLODipine  (NORVASC ) 10 MG tablet Take 1 tablet (10 mg total) by mouth daily. 07/07/24  Norleen Lynwood ORN, MD  buPROPion  (WELLBUTRIN  XL) 300 MG 24 hr tablet Take 1 tablet (300 mg total) by mouth daily. 07/07/24   Norleen Lynwood ORN, MD  chlorhexidine  (PERIDEX ) 0.12 % solution Use as directed 30 mLs in the mouth or throat 3 (three) times daily.    [provider]  FLUoxetine  (PROZAC ) 40 MG capsule Take 1 capsule (40 mg total) by mouth daily. 07/07/24   Norleen Lynwood ORN, MD  hydrochlorothiazide  (MICROZIDE ) 12.5 MG capsule Take 1 capsule (12.5 mg total) by mouth daily. 07/07/24   Norleen Lynwood ORN, MD  HYDROcodone -acetaminophen  (NORCO) 10-325 MG  tablet Take by mouth. 06/29/24   [provider]  irbesartan  (AVAPRO ) 300 MG tablet Take 1 tablet (300 mg total) by mouth daily. 07/07/24   Norleen Lynwood ORN, MD  ketoconazole (NIZORAL) 2 % shampoo Apply 1 application topically every Friday. 02/25/16   [provider]  meclizine  (ANTIVERT ) 12.5 MG tablet TAKE 1-2 TABLETS (12.5-25 MG TOTAL) BY MOUTH EVERY 6 (SIX) HOURS AS NEEDED. 11/02/21   Norleen Lynwood ORN, MD  metFORMIN  (GLUCOPHAGE -XR) 500 MG 24 hr tablet Take 1 tablet (500 mg total) by mouth 2 (two) times daily. 07/07/24   Norleen Lynwood ORN, MD  NARCAN 4 MG/0.1ML LIQD nasal spray kit  11/25/17   [provider]  OLANZapine  (ZYPREXA ) 5 MG tablet Take 1 tablet (5 mg total) by mouth daily. 07/07/24   Norleen Lynwood ORN, MD  pioglitazone  (ACTOS ) 45 MG tablet Take 1 tablet (45 mg total) by mouth daily. 07/07/24   Norleen Lynwood ORN, MD  rosuvastatin  (CRESTOR ) 10 MG tablet Take 1 tablet (10 mg total) by mouth daily. 07/07/24   Norleen Lynwood ORN, MD    Physical Exam: Vitals:   07/08/24 1306  BP: (!) 145/59  Pulse: 69  Resp: 18  Temp: 98.4 F (36.9 C)  TempSrc: Oral  SpO2: 95%    Constitutional: NAD, calm, comfortable Vitals:   07/08/24 1306  BP: (!) 145/59  Pulse: 69  Resp: 18  Temp: 98.4 F (36.9 C)  TempSrc: Oral  SpO2: 95%   Eyes: PERRL, lids and conjunctivae normal ENMT: Mucous membranes are moist. Posterior pharynx clear of any exudate or lesions.edentulous.   Neck: normal, supple, no masses, no thyromegaly Respiratory: clear to auscultation bilaterally, no wheezing, no crackles. Normal respiratory effort. No accessory muscle use.  Cardiovascular: Regular rate and rhythm, no murmurs / rubs / gallops. No extremity edema. 2+ pedal pulses. No carotid bruits.  Abdomen: no tenderness, no masses palpated. No hepatosplenomegaly. Bowel sounds positive.  Musculoskeletal: no clubbing / cyanosis. No joint deformity upper and lower extremities. Good ROM, no contractures. Normal muscle tone.  Skin:  no rashes, lesions, ulcers. No induration Neurologic: CN 2-12 grossly intact. Sensation intact, DTR normal. Strength 5/5 in all 4.  Psychiatric: Normal judgment and insight. Alert and oriented x 3. Normal mood.   Labs on Admission: I have personally reviewed following labs and imaging studies  CBC: Recent Labs  Lab 07/07/24 1520 07/08/24 1329  WBC 4.7 5.4  NEUTROABS 3.2  --   HGB 10.0* 9.8*  HCT 30.2* 30.1*  MCV 89.8 91.8  PLT 189.0 193    Basic Metabolic Panel: Recent Labs  Lab 07/07/24 1520 07/08/24 1329  NA 140 141  K 4.2 4.3  CL 104 104  CO2 27 27  GLUCOSE 124* 114*  BUN 26* 24*  CREATININE 1.90* 1.90*  CALCIUM  9.3 10.1    GFR: Estimated Creatinine Clearance: 17.8 mL/min (A) (by C-G formula  based on SCr of 1.9 mg/dL (H)).  Liver Function Tests: Recent Labs  Lab 07/07/24 1520  AST 28  ALT 21  ALKPHOS 50  BILITOT 0.8  PROT 7.1  ALBUMIN 4.4    Urine analysis:    Component Value Date/Time   COLORURINE YELLOW 07/08/2024 1355   APPEARANCEUR CLEAR 07/08/2024 1355   LABSPEC 1.011 07/08/2024 1355   PHURINE 6.0 07/08/2024 1355   GLUCOSEU NEGATIVE 07/08/2024 1355   GLUCOSEU NEGATIVE 07/07/2024 1520   HGBUR NEGATIVE 07/08/2024 1355   BILIRUBINUR NEGATIVE 07/08/2024 1355   KETONESUR NEGATIVE 07/08/2024 1355   PROTEINUR NEGATIVE 07/08/2024 1355   UROBILINOGEN 0.2 07/07/2024 1520   NITRITE NEGATIVE 07/08/2024 1355   LEUKOCYTESUR NEGATIVE 07/08/2024 1355    Radiological Exams on Admission: No results found.  EKG: Independently reviewed.  Normal sinus rhythm, LVH.  Assessment/Plan Principal Problem:   ARF (acute renal failure) Active Problems:   Hyperlipidemia   Anxiety state   Depression   Essential hypertension   GERD   Gastroparesis   Controlled diabetes mellitus type 2 with complications (HCC)   Anemia   Dementia (HCC)   #1 acute renal failure -Patient presented from PCPs office with abnormal labs with creatinine noted at 1.9.  Baseline  creatinine approximately 1.0-1.15 per EDP.  Last creatinine noted on 01/08/2023 of 1.16. - Acute renal failure likely secondary to a prerenal azotemia in the setting of ARB and HCTZ. - Urinalysis bland. - Urine sodium 106, urine creatinine 94. - Check a renal ultrasound. - Hold ARB, HCTZ. - IV fluids. - Repeat labs in the a.m. and if worsening renal function or no significant improvement will consult with nephrology for further evaluation and management.  2.  Well-controlled diabetes mellitus type 2 -Hemoglobin A1c 6.3. - Patient noted to be on pioglitazone , metformin . - Hold oral hypoglycemic agents. - SSI.  3.  Hypertension -Continue home regimen amlodipine  10 mg daily. - Hold HCTZ and irbesartan  secondary to problem #1. - IV hydralazine  as needed.  4.  Depression/anxiety -Continue home regimen fluoxetine  40 mg daily, bupropion  XL 300 mg daily.  5.  Hyperlipidemia -Continue statin.  6.  Anemia -Patient with no overt bleeding. - Checking anemia panel. - Follow H&H. - Transfusion threshold hemoglobin < 7.  7.  Dementia -Resume home regimen Zyprexa . - Delirium precautions.  8.  GERD -Pepcid .  DVT prophylaxis: Heparin  Code Status:   Full Family Communication:  Updated son at bedside, daughter on speaker phone. Disposition Plan:   Patient is from:  Home  Anticipated DC to:  Home  Anticipated DC date:  2 to 3 days  Anticipated DC barriers: Clinical improved Consults called:  None Admission status:  Place in observation  Severity of Illness: The appropriate patient status for this patient is OBSERVATION. Observation status is judged to be reasonable and necessary in order to provide the required intensity of service to ensure the patient's safety. The patient's presenting symptoms, physical exam findings, and initial radiographic and laboratory data in the context of their medical condition is felt to place them at decreased risk for further clinical deterioration.  Furthermore, it is anticipated that the patient will be medically stable for discharge from the hospital within 2 midnights of admission.     Toribio Hummer MD [facility]  How to contact the [facility] Attending or Consulting provider 7A - 7P or covering provider during after hours 7P -7A, for this patient?   Check the care team in [facility] and look for a) attending/consulting [facility] provider listed  and b) the [facility] team listed Log into [email or web address] and use [facility] universal password to access. If you do not have the password, please contact the hospital operator. Locate the [facility] provider you are looking for under [facility] and page to a number that you can be directly reached. If you still have difficulty reaching the provider, please page the [facility] (Director on Call) for the Hospitalists listed on amion for assistance.  07/08/2024, 3:57 PM        [1] No Known Allergies  "

## 2024-07-08 NOTE — Assessment & Plan Note (Signed)
 BP Readings from Last 3 Encounters:  07/08/24 (!) 145/59  07/07/24 122/70  05/04/23 122/76   Stable today, pt to continue medical treatment norvasc  10 every day, hct 12.5, avapro  300 qd

## 2024-07-08 NOTE — Assessment & Plan Note (Signed)

## 2024-07-08 NOTE — ED Triage Notes (Signed)
 Pt had lab work done yesterday and told kidney function was low , GFR 24.27, pt denies any medical complaints.

## 2024-07-08 NOTE — Assessment & Plan Note (Signed)
 Lab Results  Component Value Date   LDLCALC 32 07/07/2024   Stable, pt to continue current statin crestor  10 mg qd

## 2024-07-08 NOTE — Plan of Care (Signed)
" °  Problem: Skin Integrity: Goal: Risk for impaired skin integrity will decrease Outcome: Progressing   Problem: Clinical Measurements: Goal: Ability to maintain clinical measurements within normal limits will improve Outcome: Progressing Goal: Will remain free from infection Outcome: Progressing Goal: Diagnostic test results will improve Outcome: Progressing   Problem: Activity: Goal: Risk for activity intolerance will decrease Outcome: Progressing   Problem: Elimination: Goal: Will not experience complications related to bowel motility Outcome: Progressing Goal: Will not experience complications related to urinary retention Outcome: Progressing   Problem: Pain Managment: Goal: General experience of comfort will improve and/or be controlled Outcome: Progressing   Problem: Safety: Goal: Ability to remain free from injury will improve Outcome: Progressing   "

## 2024-07-08 NOTE — ED Provider Notes (Signed)
 " [place] EMERGENCY DEPARTMENT AT [facility] Provider Note   CSN: [id number] Arrival date & time: 07/08/24  1259     Patient presents with: Abnormal Lab   Latasha Wang is a 84 y.o. female with past medical history significant for anxiety, depression, hyperlipidemia, hypertension who presents concern for abnormal kidney function.  She had her routine monthly lab work yesterday, had elevated creatinine at 1.9, GFR of 24.27.  She reports that she has been feeling normal, denies any dysuria, hematuria, abdominal pain, she reports that she has been drinking plenty of fluids and eating as normal.   Abnormal Lab      Prior to Admission medications  Medication Sig Start Date End Date Taking? Authorizing Provider  acetaminophen  (TYLENOL ) 500 MG tablet Take 1-2 tablets (500-1,000 mg total) by mouth every 6 (six) hours as needed for pain or fever. For pain 02/19/12   Deward Eck, PA-C  albuterol  (PROVENTIL  HFA;VENTOLIN  HFA) 108 (90 BASE) MCG/ACT inhaler Inhale 2 puffs into the lungs every 6 (six) hours as needed for wheezing. 08/12/12   Inocencio Berwyn LABOR, MD  alendronate  (FOSAMAX ) 70 MG tablet Take 1 tablet (70 mg total) by mouth once a week. Take with a full glass of water on an empty stomach. 07/07/24   Norleen Lynwood ORN, MD  amLODipine  (NORVASC ) 10 MG tablet Take 1 tablet (10 mg total) by mouth daily. 07/07/24   Norleen Lynwood ORN, MD  buPROPion  (WELLBUTRIN  XL) 300 MG 24 hr tablet Take 1 tablet (300 mg total) by mouth daily. 07/07/24   Norleen Lynwood ORN, MD  chlorhexidine  (PERIDEX ) 0.12 % solution Use as directed 30 mLs in the mouth or throat 3 (three) times daily.    [provider]  FLUoxetine  (PROZAC ) 40 MG capsule Take 1 capsule (40 mg total) by mouth daily. 07/07/24   Norleen Lynwood ORN, MD  hydrochlorothiazide  (MICROZIDE ) 12.5 MG capsule Take 1 capsule (12.5 mg total) by mouth daily. 07/07/24   Norleen Lynwood ORN, MD  HYDROcodone -acetaminophen  [facility]) 10-325 MG tablet Take by mouth. 06/29/24    [provider]  irbesartan  (AVAPRO ) 300 MG tablet Take 1 tablet (300 mg total) by mouth daily. 07/07/24   Norleen Lynwood ORN, MD  ketoconazole (NIZORAL) 2 % shampoo Apply 1 application topically every Friday. 02/25/16   [provider]  meclizine  (ANTIVERT ) 12.5 MG tablet TAKE 1-2 TABLETS (12.5-25 MG TOTAL) BY MOUTH EVERY 6 (SIX) HOURS AS NEEDED. 11/02/21   Norleen Lynwood ORN, MD  metFORMIN  (GLUCOPHAGE -XR) 500 MG 24 hr tablet Take 1 tablet (500 mg total) by mouth 2 (two) times daily. 07/07/24   Norleen Lynwood ORN, MD  NARCAN 4 MG/0.1ML LIQD nasal spray kit  11/25/17   [provider]  OLANZapine  (ZYPREXA ) 5 MG tablet Take 1 tablet (5 mg total) by mouth daily. 07/07/24   Norleen Lynwood ORN, MD  pioglitazone  (ACTOS ) 45 MG tablet Take 1 tablet (45 mg total) by mouth daily. 07/07/24   Norleen Lynwood ORN, MD  rosuvastatin  (CRESTOR ) 10 MG tablet Take 1 tablet (10 mg total) by mouth daily. 07/07/24   Norleen Lynwood ORN, MD    Allergies: Patient has no known allergies.    Review of Systems  All other systems reviewed and are negative.   Updated Vital Signs BP (!) 145/59 (BP Location: Right Arm)   Pulse 69   Temp 98.4 F (36.9 C) (Oral)   Resp 18   SpO2 95%   Physical Exam Vitals and nursing note reviewed.  Constitutional:  General: She is not in acute distress.    Appearance: Normal appearance.  HENT:     Head: Normocephalic and atraumatic.  Eyes:     General:        Right eye: No discharge.        Left eye: No discharge.  Cardiovascular:     Rate and Rhythm: Normal rate and regular rhythm.     Heart sounds: No murmur heard.    No friction rub. No gallop.  Pulmonary:     Effort: Pulmonary effort is normal.     Breath sounds: Normal breath sounds.  Abdominal:     General: Bowel sounds are normal.     Palpations: Abdomen is soft.  Skin:    General: Skin is warm and dry.     Capillary Refill: Capillary refill takes less than 2 seconds.  Neurological:     Mental Status: She is alert and  oriented to person, place, and time.  Psychiatric:        Mood and Affect: Mood normal.        Behavior: Behavior normal.     (all labs ordered are listed, but only abnormal results are displayed) Labs Reviewed  CBC - Abnormal; Notable for the following components:      Result Value   RBC 3.28 (*)    Hemoglobin 9.8 (*)    HCT 30.1 (*)    All other components within normal limits  BASIC METABOLIC PANEL WITH GFR - Abnormal; Notable for the following components:   Glucose, Bld 114 (*)    BUN 24 (*)    Creatinine, Ser 1.90 (*)    GFR, Estimated 26 (*)    All other components within normal limits  URINALYSIS, ROUTINE W REFLEX MICROSCOPIC    EKG: EKG Interpretation Date/Time:  Saturday July 08 2024 13:25:52 EST Ventricular Rate:  73 PR Interval:  175 QRS Duration:  92 QT Interval:  423 QTC Calculation: 467 R Axis:   -14  Text Interpretation: Sinus rhythm LVH with secondary repolarization abnormality Confirmed by Franklyn Gills [phone]) on 07/08/2024 1:27:33 PM  Radiology: No results found.   Procedures   Medications Ordered in the ED  sodium chloride  0.9 % bolus 1,000 mL (1,000 mLs Intravenous New Bag/Given 07/08/24 1412)                                    Medical Decision Making  This patient is a 84 y.o. female  who presents to the ED for concern of elevated kidney function.   Differential diagnoses prior to evaluation: The emergent differential diagnosis includes, but is not limited to, AKI, dehydration, obstructive nephropathy, versus other. This is not an exhaustive differential.   Past Medical History / Co-morbidities / Social History:  anxiety, depression, hyperlipidemia, hypertension  Additional history: Chart reviewed. Pertinent results include: Reviewed outpatient lab work from PCP  Physical Exam: Physical exam performed. The pertinent findings include: Patient overall well-appearing, not clinically with appearance of significant dehydration, no  tachycardia, vital signs stable other than some mild hypertension, blood pressure 145/59.  Lab Tests/Imaging studies: I personally interpreted labs/imaging and the pertinent results include: CBC overall unremarkable, anemia fairly stable compared to baseline, 9.8.  UA unremarkable, no ketones, no hematuria.  BMP notable for elevated BUN, creatinine, BUN 24, creatinine 1.9..  Given no acute abdominal pain considered I agree with the radiologist interpretation.  Cardiac monitoring: EKG obtained and interpreted  by myself and attending physician which shows: Normal sinus rhythm, LVH, LVH is new compared to EKG from 2017, no other recent to compare   Medications: I ordered medication including fluid bolus for AKI, dehydration.  I have reviewed the patients home medicines and have made adjustments as needed.  Consults: Spoke with hospitalist,  dr. Sebastian who agrees to admission for AKI, dehydration   Disposition: After consideration of the diagnostic results and the patients response to treatment, I feel that patient would benefit from admission for AKI .    Final diagnoses:  AKI (acute kidney injury)    ED Discharge Orders     None          Rosan Sherlean DEL, PA-C 07/08/24 1451  "

## 2024-07-08 NOTE — Assessment & Plan Note (Addendum)
 [id number]  Lab Results  Component Value Date   HGBA1C 6.3 07/07/2024   Stable, pt to continue current medical treatment metformin  ER 500 mg bid, actos  45 mg qd

## 2024-07-09 DIAGNOSIS — E1143 Type 2 diabetes mellitus with diabetic autonomic (poly)neuropathy: Secondary | ICD-10-CM | POA: Diagnosis present

## 2024-07-09 DIAGNOSIS — F0393 Unspecified dementia, unspecified severity, with mood disturbance: Secondary | ICD-10-CM | POA: Diagnosis present

## 2024-07-09 DIAGNOSIS — N1831 Chronic kidney disease, stage 3a: Secondary | ICD-10-CM | POA: Diagnosis not present

## 2024-07-09 DIAGNOSIS — F03A0 Unspecified dementia, mild, without behavioral disturbance, psychotic disturbance, mood disturbance, and anxiety: Secondary | ICD-10-CM

## 2024-07-09 DIAGNOSIS — F411 Generalized anxiety disorder: Secondary | ICD-10-CM

## 2024-07-09 DIAGNOSIS — E860 Dehydration: Secondary | ICD-10-CM | POA: Diagnosis present

## 2024-07-09 DIAGNOSIS — F32A Depression, unspecified: Secondary | ICD-10-CM | POA: Diagnosis present

## 2024-07-09 DIAGNOSIS — E1122 Type 2 diabetes mellitus with diabetic chronic kidney disease: Secondary | ICD-10-CM

## 2024-07-09 DIAGNOSIS — N179 Acute kidney failure, unspecified: Secondary | ICD-10-CM | POA: Diagnosis present

## 2024-07-09 DIAGNOSIS — Z79899 Other long term (current) drug therapy: Secondary | ICD-10-CM | POA: Diagnosis not present

## 2024-07-09 DIAGNOSIS — D649 Anemia, unspecified: Secondary | ICD-10-CM | POA: Diagnosis present

## 2024-07-09 DIAGNOSIS — E785 Hyperlipidemia, unspecified: Secondary | ICD-10-CM | POA: Diagnosis present

## 2024-07-09 DIAGNOSIS — N189 Chronic kidney disease, unspecified: Secondary | ICD-10-CM | POA: Diagnosis present

## 2024-07-09 DIAGNOSIS — Z7984 Long term (current) use of oral hypoglycemic drugs: Secondary | ICD-10-CM | POA: Diagnosis not present

## 2024-07-09 DIAGNOSIS — F0394 Unspecified dementia, unspecified severity, with anxiety: Secondary | ICD-10-CM | POA: Diagnosis present

## 2024-07-09 DIAGNOSIS — Z7983 Long term (current) use of bisphosphonates: Secondary | ICD-10-CM | POA: Diagnosis not present

## 2024-07-09 DIAGNOSIS — K219 Gastro-esophageal reflux disease without esophagitis: Secondary | ICD-10-CM

## 2024-07-09 DIAGNOSIS — I10 Essential (primary) hypertension: Secondary | ICD-10-CM

## 2024-07-09 DIAGNOSIS — I129 Hypertensive chronic kidney disease with stage 1 through stage 4 chronic kidney disease, or unspecified chronic kidney disease: Secondary | ICD-10-CM | POA: Diagnosis present

## 2024-07-09 LAB — GLUCOSE, CAPILLARY
Glucose-Capillary: 98 mg/dL (ref 70–99)
Glucose-Capillary: 82 mg/dL (ref 70–99)
Glucose-Capillary: 106 mg/dL — ABNORMAL HIGH (ref 70–99)
Glucose-Capillary: 194 mg/dL — ABNORMAL HIGH (ref 70–99)

## 2024-07-09 LAB — CBC
HCT: 28.5 % — ABNORMAL LOW (ref 36.0–46.0)
Hemoglobin: 9 g/dL — ABNORMAL LOW (ref 12.0–15.0)
MCH: 29.3 pg (ref 26.0–34.0)
MCHC: 31.6 g/dL (ref 30.0–36.0)
MCV: 92.8 fL (ref 80.0–100.0)
Platelets: 162 K/uL (ref 150–400)
RBC: 3.07 MIL/uL — ABNORMAL LOW (ref 3.87–5.11)
RDW: 15.6 % — ABNORMAL HIGH (ref 11.5–15.5)
WBC: 4.9 K/uL (ref 4.0–10.5)
nRBC: 0 % (ref 0.0–0.2)

## 2024-07-09 LAB — BASIC METABOLIC PANEL WITH GFR
Anion gap: 8 (ref 5–15)
BUN: 21 mg/dL (ref 8–23)
CO2: 25 mmol/L (ref 22–32)
Calcium: 9.1 mg/dL (ref 8.9–10.3)
Chloride: 109 mmol/L (ref 98–111)
Creatinine, Ser: 1.45 mg/dL — ABNORMAL HIGH (ref 0.44–1.00)
GFR, Estimated: 36 mL/min — ABNORMAL LOW
Glucose, Bld: 104 mg/dL — ABNORMAL HIGH (ref 70–99)
Potassium: 3.8 mmol/L (ref 3.5–5.1)
Sodium: 142 mmol/L (ref 135–145)

## 2024-07-09 LAB — IRON AND TIBC
Iron: 61 ug/dL (ref 28–170)
Saturation Ratios: 19 % (ref 10.4–31.8)
TIBC: 329 ug/dL (ref 250–450)
UIBC: 268 ug/dL

## 2024-07-09 LAB — VITAMIN B12: Vitamin B-12: 787 pg/mL (ref 180–914)

## 2024-07-09 LAB — FERRITIN: Ferritin: 24 ng/mL (ref 11–307)

## 2024-07-09 LAB — FOLATE: Folate: 19.7 ng/mL

## 2024-07-09 MED ORDER — ALBUTEROL SULFATE (2.5 MG/3ML) 0.083% IN NEBU
2.5000 mg | INHALATION_SOLUTION | Freq: Two times a day (BID) | RESPIRATORY_TRACT | Status: DC
  Administered 2024-07-09 – 2024-07-10 (×2): 2.5 mg via RESPIRATORY_TRACT
  Filled 2024-07-09 (×2): qty 3

## 2024-07-09 MED ORDER — SODIUM CHLORIDE 0.9 % IV SOLN: INTRAVENOUS | Status: DC

## 2024-07-09 NOTE — Progress Notes (Signed)
 " PROGRESS NOTE    Latasha Wang  FMW:[id number] DOB: 02-07-1941 DOA: 07/08/2024 PCP: Norleen Lynwood ORN, MD    Chief Complaint  Patient presents with   Abnormal Lab    Brief Narrative:  Patient 84 year old female history of type 2 diabetes, depression, anxiety, hypertension, GERD, hyperlipidemia, who presented to the ED secondary to abnormal labs obtained from PCPs office.  Patient noted to be in AKI.    Assessment & Plan:   Principal Problem:   ARF (acute renal failure) Active Problems:   Hyperlipidemia   Anxiety state   Depression   Essential hypertension   GERD   Gastroparesis   Diabetes mellitus with chronic kidney disease (HCC)   Anemia   Dementia (HCC)  #1 acute renal failure -Patient presented from PCPs office with abnormal labs with creatinine noted at 1.9.  Baseline creatinine approximately 1.0-1.15 per EDP.  Last creatinine noted on 01/08/2023 of 1.16. - Acute renal failure likely secondary to a prerenal azotemia in the setting of ARB and HCTZ. - Urinalysis bland. - Urine sodium 106, urine creatinine 94. - Renal ultrasound done negative for hydronephrosis, no masses no calculi seen.   - Renal function improving with hydration.   - Continue IV fluids.   - Continue to hold ARB, HCTZ.  -Will likely not resume ARB, HCTZ on discharge. -Repeat labs in the AM.   2.  Well-controlled diabetes mellitus type 2 -Hemoglobin A1c 6.3. - Patient noted to be on pioglitazone , metformin . - CBG 98 this morning. - Continue to hold oral hypoglycemic agents. - SSI.   3.  Hypertension -Continue home regimen amlodipine  10 mg daily. - Continue to hold HCTZ and irbesartan  secondary to problem #1. - IV hydralazine  as needed.   4.  Depression/anxiety -Continue home regimen fluoxetine  40 mg daily, bupropion  XL 300 mg daily.   5.  Hyperlipidemia - Statin.   6.  Anemia -Patient with no overt bleeding. - Anemia panel with iron level of 61, TIBC of 329, ferritin of 24, folate of 19.7,  vitamin B12 of 787.  - Hemoglobin stable at 9.0.   - Follow H&H. - Transfusion threshold hemoglobin < 7.   7.  Dementia - Continue home regimen Zyprexa . - Delirium precautions.   8.  GERD - Continue Pepcid .   DVT prophylaxis: Heparin  Code Status: Full Family Communication: Updated patient.  No family at bedside. Disposition: Home when clinically improved and renal function back to baseline hopefully in the next 24 to 48 hours.  Status is: Observation The patient will require care spanning > 2 midnights and should be moved to inpatient because: Severity of illness   Consultants:  None  Procedures:  Renal ultrasound 07/08/2024   Antimicrobials:  Anti-infectives (From admission, onward)    None         Subjective: Patient lying in bed.  Denies any chest pain or shortness of breath.  No abdominal pain.  Tolerating oral intake.  Feels well.  Objective: Vitals:   07/09/24 0533 07/09/24 0720 07/09/24 0905 07/09/24 0944  BP: (!) 143/60  (!) 140/59   Pulse: 73     Resp: 18     Temp: 98.4 F (36.9 C)     TempSrc: Oral     SpO2: 96%   97%  Weight:  59.4 kg    Height:        Intake/Output Summary (Last 24 hours) at 07/09/2024 1147 Last data filed at 07/09/2024 0758 Gross per 24 hour  Intake 1345.83 ml  Output 1200  ml  Net 145.83 ml   Filed Weights   07/08/24 1724 07/09/24 0720  Weight: 59 kg 59.4 kg    Examination:  General exam: Appears calm and comfortable  Respiratory system: Clear to auscultation. Respiratory effort normal. Cardiovascular system: S1 & S2 heard, RRR. No JVD, murmurs, rubs, gallops or clicks. No pedal edema. Gastrointestinal system: Abdomen is nondistended, soft and nontender. No organomegaly or masses felt. Normal bowel sounds heard. Central nervous system: Alert and oriented. No focal neurological deficits. Extremities: Symmetric 5 x 5 power. Skin: No rashes, lesions or ulcers Psychiatry: Judgement and insight appear normal. Mood &  affect appropriate.     Data Reviewed: I have personally reviewed following labs and imaging studies  CBC: Recent Labs  Lab 07/07/24 1520 07/08/24 1329 07/09/24 0628  WBC 4.7 5.4 4.9  NEUTROABS 3.2  --   --   HGB 10.0* 9.8* 9.0*  HCT 30.2* 30.1* 28.5*  MCV 89.8 91.8 92.8  PLT 189.0 193 162    Basic Metabolic Panel: Recent Labs  Lab 07/07/24 1520 07/08/24 1329 07/09/24 0628  NA 140 141 142  K 4.2 4.3 3.8  CL 104 104 109  CO2 27 27 25   GLUCOSE 124* 114* 104*  BUN 26* 24* 21  CREATININE 1.90* 1.90* 1.45*  CALCIUM  9.3 10.1 9.1    GFR: Estimated Creatinine Clearance: 23.5 mL/min (A) (by C-G formula based on SCr of 1.45 mg/dL (H)).  Liver Function Tests: Recent Labs  Lab 07/07/24 1520  AST 28  ALT 21  ALKPHOS 50  BILITOT 0.8  PROT 7.1  ALBUMIN 4.4    CBG: Recent Labs  Lab 07/08/24 1937 07/09/24 0755  GLUCAP 88 98     No results found for this or any previous visit (from the past 240 hours).       Radiology Studies: US  RENAL Result Date: 07/08/2024 EXAM: RETROPERITONEAL ULTRASOUND OF THE KIDNEYS 07/08/2024 05:04:28 PM TECHNIQUE: Real-time ultrasonography of the retroperitoneum, specifically the kidneys and urinary bladder, was performed. COMPARISON: None available. CLINICAL HISTORY: ARF (acute renal failure). FINDINGS: RIGHT KIDNEY: Right kidney measures 8.7 x 4.0 x 4.2 cm. Normal cortical echogenicity. No hydronephrosis. No calculus. No mass. Right ureteral jet is visualized. LEFT KIDNEY: Left kidney measures 9.0 x 5.2 x 3.5 cm. Normal cortical echogenicity. No hydronephrosis. No calculus. No mass. Left ureteral jet is visualized. BLADDER: Unremarkable appearance of the bladder. IMPRESSION: 1. No significant abnormality. Electronically signed by: Greig Pique MD 07/08/2024 05:15 PM EST RP Workstation: [id number]        Scheduled Meds:  albuterol   2.5 mg Nebulization BID   amLODipine   10 mg Oral Daily   buPROPion   300 mg Oral Daily    chlorhexidine   30 mL Mouth/Throat TID   famotidine   10 mg Oral Daily   feeding supplement  237 mL Oral BID BM   FLUoxetine   40 mg Oral Daily   heparin   5,000 Units Subcutaneous Q8H   insulin  aspart  0-5 Units Subcutaneous QHS   insulin  aspart  0-9 Units Subcutaneous TID WC   OLANZapine   5 mg Oral Daily   rosuvastatin   10 mg Oral QHS   sodium chloride  flush  3 mL Intravenous Q12H   Continuous Infusions:  sodium chloride  125 mL/hr at 07/09/24 0958     LOS: 0 days    Time spent: 35 minutes    Toribio Hummer, MD [facility]   To contact the attending provider between 7A-7P or the covering provider during after hours 7P-7A, please log  into the web site [email or web address] and access using universal [place] password for that web site. If you do not have the password, please call the hospital operator.  07/09/2024, 11:47 AM    "

## 2024-07-09 NOTE — Plan of Care (Signed)

## 2024-07-10 DIAGNOSIS — E1122 Type 2 diabetes mellitus with diabetic chronic kidney disease: Secondary | ICD-10-CM | POA: Diagnosis not present

## 2024-07-10 DIAGNOSIS — N179 Acute kidney failure, unspecified: Secondary | ICD-10-CM | POA: Diagnosis not present

## 2024-07-10 DIAGNOSIS — F32A Depression, unspecified: Secondary | ICD-10-CM | POA: Diagnosis not present

## 2024-07-10 DIAGNOSIS — D649 Anemia, unspecified: Secondary | ICD-10-CM | POA: Diagnosis not present

## 2024-07-10 LAB — CBC WITH DIFFERENTIAL/PLATELET
Abs Immature Granulocytes: 0.02 K/uL (ref 0.00–0.07)
Basophils Absolute: 0 K/uL (ref 0.0–0.1)
Basophils Relative: 1 %
Eosinophils Absolute: 0.2 K/uL (ref 0.0–0.5)
Eosinophils Relative: 3 %
HCT: 29.6 % — ABNORMAL LOW (ref 36.0–46.0)
Hemoglobin: 9.3 g/dL — ABNORMAL LOW (ref 12.0–15.0)
Immature Granulocytes: 0 %
Lymphocytes Relative: 31 %
Lymphs Abs: 1.6 K/uL (ref 0.7–4.0)
MCH: 29.3 pg (ref 26.0–34.0)
MCHC: 31.4 g/dL (ref 30.0–36.0)
MCV: 93.4 fL (ref 80.0–100.0)
Monocytes Absolute: 0.5 K/uL (ref 0.1–1.0)
Monocytes Relative: 9 %
Neutro Abs: 2.9 K/uL (ref 1.7–7.7)
Neutrophils Relative %: 56 %
Platelets: 162 K/uL (ref 150–400)
RBC: 3.17 MIL/uL — ABNORMAL LOW (ref 3.87–5.11)
RDW: 15.5 % (ref 11.5–15.5)
WBC: 5.2 K/uL (ref 4.0–10.5)
nRBC: 0 % (ref 0.0–0.2)

## 2024-07-10 LAB — BASIC METABOLIC PANEL WITH GFR
Anion gap: 9 (ref 5–15)
BUN: 16 mg/dL (ref 8–23)
CO2: 23 mmol/L (ref 22–32)
Calcium: 8.9 mg/dL (ref 8.9–10.3)
Chloride: 112 mmol/L — ABNORMAL HIGH (ref 98–111)
Creatinine, Ser: 1.42 mg/dL — ABNORMAL HIGH (ref 0.44–1.00)
GFR, Estimated: 37 mL/min — ABNORMAL LOW
Glucose, Bld: 103 mg/dL — ABNORMAL HIGH (ref 70–99)
Potassium: 3.9 mmol/L (ref 3.5–5.1)
Sodium: 144 mmol/L (ref 135–145)

## 2024-07-10 LAB — GLUCOSE, CAPILLARY
Glucose-Capillary: 96 mg/dL (ref 70–99)
Glucose-Capillary: 161 mg/dL — ABNORMAL HIGH (ref 70–99)

## 2024-07-10 MED ORDER — ACETAMINOPHEN 325 MG PO TABS: 650.0000 mg | ORAL_TABLET | Freq: Four times a day (QID) | ORAL | Status: AC | PRN

## 2024-07-10 MED ORDER — SODIUM CHLORIDE 0.45 % IV SOLN: INTRAVENOUS | Status: DC

## 2024-07-10 MED ORDER — ENSURE PLUS HIGH PROTEIN PO LIQD: 237.0000 mL | Freq: Two times a day (BID) | ORAL | Status: AC

## 2024-07-10 NOTE — Telephone Encounter (Signed)
 Ok to contact pt - she will need to cut back on the Vitamin D  to every other day only.    Thanks!

## 2024-07-10 NOTE — Evaluation (Signed)
 Occupational Therapy Evaluation Patient Details Name: Latasha Wang MRN: [id number] DOB: Aug 19, 1940 Today's Date: 07/10/2024   History of Present Illness   Patient presenting to ED on 07/08/24 after abnormal labs with alarmingly low kidney function at PCP. Admitted with acute renal failure. PMH includes: diabetes mellitus type 2, depression and anxiety, hypertension, GERD, hyperlipidemia, dementia     Clinical Impressions Prior to this admission, patient living with family and occasionally needing help with lower body bathing and dressing. Patient does not use a device for ambulation, and is typically active. Currently, patient is at her baseline with no further acute OT needs and able to complete ADLs and functional mobility at a mod I level. OT will sign off at this time, please re-consult if further acute OT needs are needed.      If plan is discharge home, recommend the following:   Assist for transportation     Functional Status Assessment   Patient has not had a recent decline in their functional status     Equipment Recommendations   None recommended by OT     Recommendations for Other Services         Precautions/Restrictions   Precautions Precautions: Fall Recall of Precautions/Restrictions: Intact Restrictions Weight Bearing Restrictions Per Provider Order: No     Mobility Bed Mobility Overal bed mobility: Modified Independent                  Transfers Overall transfer level: Modified independent Equipment used: None                      Balance                                           ADL either performed or assessed with clinical judgement   ADL Overall ADL's : At baseline                                       General ADL Comments: Prior to this admission, patient living with family and occasionally needing help with lower body bathing and dressing. Patient does not use a device for  ambulation, and is typically active. Currently, patient is at her baseline with no further acute OT needs and able to complete ADLs and functional mobility at a mod I level. OT will sign off at this time, please re-consult if further acute OT needs are needed.     Vision Baseline Vision/History: 1 Wears glasses Ability to See in Adequate Light: 0 Adequate Patient Visual Report: No change from baseline Vision Assessment?: Wears glasses for reading     Perception Perception: Not tested       Praxis Praxis: Not tested       Pertinent Vitals/Pain Pain Assessment Pain Assessment: No/denies pain     Extremity/Trunk Assessment Upper Extremity Assessment Upper Extremity Assessment: Overall WFL for tasks assessed;Right hand dominant   Lower Extremity Assessment Lower Extremity Assessment: Defer to PT evaluation;Overall [facility] for tasks assessed   Cervical / Trunk Assessment Cervical / Trunk Assessment: Normal   Communication Communication Communication: Impaired Factors Affecting Communication:  (English not primary language, family utilized to promote understanding)   Cognition Arousal: Alert Behavior During Therapy: WFL for tasks assessed/performed Cognition: No apparent impairments  Following commands: Intact       Cueing  General Comments   Cueing Techniques: Verbal cues  VSS   Exercises     Shoulder Instructions      Home Living Family/patient expects to be discharged to:: Private residence Living Arrangements: Children Available Help at Discharge: Available 24 hours/day;Family Type of Home: House Home Access: Stairs to enter [employer] of Steps: 3-4 Entrance Stairs-Rails: Right;Left Home Layout: Two level Alternate Level Stairs-Number of Steps: 12 Alternate Level Stairs-Rails: Can reach both Bathroom Shower/Tub: Chief Strategy Officer: Standard     Home Equipment: None           Prior Functioning/Environment Prior Level of Function : Needs assist               ADLs Comments: assist with bathing and lower body dressing when needed    OT Problem List: Decreased activity tolerance   OT Treatment/Interventions:        OT Goals(Current goals can be found in the care plan section)   Acute Rehab OT Goals Patient Stated Goal: to get better OT Goal Formulation: With patient/family Time For Goal Achievement: 07/24/24 Potential to Achieve Goals: Good   OT Frequency:       Co-evaluation              AM-PAC OT 6 Clicks Daily Activity     Outcome Measure Help from another person eating meals?: None Help from another person taking care of personal grooming?: None Help from another person toileting, which includes using toliet, bedpan, or urinal?: None Help from another person bathing (including washing, rinsing, drying)?: None Help from another person to put on and taking off regular upper body clothing?: None Help from another person to put on and taking off regular lower body clothing?: None 6 Click Score: 24   End of Session Equipment Utilized During Treatment: Gait belt Nurse Communication: Mobility status  Activity Tolerance: Patient tolerated treatment well Patient left: in bed;with call bell/phone within reach;with family/visitor present (sitting EOB)  OT Visit Diagnosis: Muscle weakness (generalized) (M62.81)                Time: 1034-1050 OT Time Calculation (min): 16 min Charges:  OT General Charges $OT Visit: 1 Visit OT Evaluation $OT Eval Moderate Complexity: 1 Mod  Ronal Gift E. Arjan Strohm, OTR/L Acute Rehabilitation Services [phone]   Ronal Gift Salt 07/10/2024, 11:22 AM

## 2024-07-10 NOTE — Evaluation (Signed)
 Physical Therapy Evaluation Patient Details Name: Latasha Wang MRN: [id number] DOB: 1940/11/13 Today's Date: 07/10/2024  History of Present Illness  Patient presenting to ED on 07/08/24 after abnormal labs with alarmingly low kidney function at PCP. Admitted with acute renal failure. PMH includes: diabetes mellitus type 2, depression and anxiety, hypertension, GERD, hyperlipidemia, dementia  Clinical Impression    Patient evaluated by Physical Therapy with no further acute PT needs identified. All education has been completed and the patient has no further questions.  See below for any follow-up Physical Therapy or DME needs. PT is signing off. Thank you for this referral.        If plan is discharge home, recommend the following: A little help with walking and/or transfers;A little help with bathing/dressing/bathroom;Assistance with cooking/housework;Assist for transportation   Can travel by private vehicle        Equipment Recommendations None recommended by PT  Recommendations for Other Services       Functional Status Assessment Patient has not had a recent decline in their functional status     Precautions / Restrictions Precautions Precautions: Fall Recall of Precautions/Restrictions: Intact Restrictions Weight Bearing Restrictions Per Provider Order: No      Mobility  Bed Mobility Overal bed mobility: Modified Independent                  Transfers Overall transfer level: Modified independent Equipment used: None                    Ambulation/Gait Ambulation/Gait assistance: Supervision Gait Distance (Feet): 500 Feet Assistive device: None Gait Pattern/deviations: Step-through pattern Gait velocity: wfl     General Gait Details: pt demonstrated good reciprocal pattern, B foot clearance and no overt LOB  Stairs            Wheelchair Mobility     Tilt Bed    Modified Rankin (Stroke Patients Only)       Balance Overall  balance assessment: No apparent balance deficits (not formally assessed)                                           Pertinent Vitals/Pain Pain Assessment Pain Assessment: No/denies pain    Home Living Family/patient expects to be discharged to:: Private residence Living Arrangements: Children Available Help at Discharge: Available 24 hours/day;Family Type of Home: House Home Access: Stairs to enter Entrance Stairs-Rails: Doctor, [facility] of Steps: 3-4 Alternate Level Stairs-Number of Steps: 12 Home Layout: Two level Home Equipment: None      Prior Function Prior Level of Function : Needs assist             Mobility Comments: IND no AD for gait tasks in home ADLs Comments: assist with bathing and lower body dressing when needed     Extremity/Trunk Assessment        Lower Extremity Assessment Lower Extremity Assessment: Overall WFL for tasks assessed    Cervical / Trunk Assessment Cervical / Trunk Assessment: Normal  Communication   Communication Communication: Impaired Factors Affecting Communication:  (English not primary language, family utilized to promote understanding)    Cognition Arousal: Alert Behavior During Therapy: WFL for tasks assessed/performed   PT - Cognitive impairments: No apparent impairments                         Following  commands: Intact       Cueing Cueing Techniques: Verbal cues     General Comments      Exercises     Assessment/Plan    PT Assessment Patient does not need any further PT services  PT Problem List         PT Treatment Interventions      PT Goals (Current goals can be found in the Care Plan section)  Acute Rehab PT Goals Patient Stated Goal: to go home PT Goal Formulation: With patient Time For Goal Achievement: 07/17/24 Potential to Achieve Goals: Good    Frequency       Co-evaluation               AM-PAC PT 6 Clicks Mobility  Outcome  Measure Help needed turning from your back to your side while in a flat bed without using bedrails?: None Help needed moving from lying on your back to sitting on the side of a flat bed without using bedrails?: None Help needed moving to and from a bed to a chair (including a wheelchair)?: None Help needed standing up from a chair using your arms (e.g., wheelchair or bedside chair)?: None Help needed to walk in hospital room?: A Little Help needed climbing 3-5 steps with a railing? : A Little 6 Click Score: 22    End of Session Equipment Utilized During Treatment: Gait belt Activity Tolerance: Patient tolerated treatment well Patient left: in bed;with family/visitor present;with call bell/phone within reach Nurse Communication: Mobility status PT Visit Diagnosis: Unsteadiness on feet (R26.81)    Time: 8965-8948 PT Time Calculation (min) (ACUTE ONLY): 17 min   Charges:       PT General Charges $$ ACUTE PT VISIT: 1 Visit         [place], PT Acute Rehab   [place] VEAR Drone 07/10/2024, 7:16 PM

## 2024-07-10 NOTE — Discharge Summary (Signed)
 Physician Discharge Summary  Latasha Wang FMW:[id number] DOB: 06-Apr-1941 DOA: 07/08/2024  PCP: Norleen Lynwood ORN, MD  Admit date: 07/08/2024 Discharge date: 07/10/2024  Time spent: 60 minutes  Recommendations for Outpatient Follow-up:  Follow-up with Norleen Lynwood ORN, MD in 1 week.  On follow-up patient will need a basic metabolic profile done to follow-up on electrolytes and renal function.  Patient will need a CBC done to follow-up on counts.  Patient's ARB and HCTZ were held on discharge and will defer to PCP on whether to resume on follow-up.   Discharge Diagnoses:  Principal Problem:   ARF (acute renal failure) Active Problems:   Hyperlipidemia   Anxiety state   Depression   Essential hypertension   GERD   Gastroparesis   Diabetes mellitus with chronic kidney disease (HCC)   Anemia   Dementia (HCC)   Discharge Condition: Stable and improved.  Diet recommendation: Soft diet  Filed Weights   07/08/24 1724 07/09/24 0720 07/10/24 0621  Weight: 59 kg 59.4 kg 60.2 kg    History of present illness:  Latasha Wang is a 84 y.o. female with medical history significant of diabetes mellitus type 2, depression and anxiety, hypertension, GERD, hyperlipidemia, dementia presenting to the ED secondary to abnormal labs obtained at PCPs office.  Son at bedside who is helping with history.  Per son and patient patient went to see PCP for routine visit 1 day prior to admission, labs were obtained and patient was called the evening of 07/07/2024, 1 day prior to admission, and told that  she had an alarmingly low kidney function and needed to present to the emergency room right away. Patient denies any fevers, no chills, no nausea, no vomiting, no diarrhea, no constipation, no melena, no hematemesis, no hematochezia, no dysuria.  Patient does endorse some occasional dizziness attributed to her vertigo.  Patient also endorses increased urinary frequency at night over the past 2 months.  Patient denies  any decreased appetite or change in her appetite.   ED Course: Patient seen in the ED, labs obtained with a basic metabolic profile with a BUN of 24, creatinine of 1.90 otherwise within normal limits.  CBC with a hemoglobin of 9.8 otherwise within normal limits.  Urinalysis done bland.  Hospital Course:  #1 acute renal failure -Patient presented from PCPs office with abnormal labs with creatinine noted at 1.9.  Baseline creatinine approximately 1.0-1.15 per EDP.  Last creatinine noted on 01/08/2023 of 1.16. - Acute renal failure secondary to a prerenal azotemia secondary to dehydration, in the setting of ARB and HCTZ. - Urinalysis bland. - Urine sodium 106, urine creatinine 94. - Renal ultrasound done negative for hydronephrosis, no masses no calculi seen.   - Renal function improved with hydration.   - Patient is ARB and HCTZ held during the hospitalization and will be held on discharge until follow-up with PCP.   - Patient encouraged to continue good oral intake.   - Case discussed with nephrology who was in agreement.   - Outpatient follow-up with PCP in 1 week for repeat labs and further recommendations.   - Patient will be discharged in stable and improved condition.    2.  Well-controlled diabetes mellitus type 2 -Hemoglobin A1c 6.3. - Patient noted to be on pioglitazone , metformin  which were held during the hospitalization. - Patient maintained on sliding scale insulin .   - Oral hypoglycemic agents will be resumed on discharge  - Outpatient follow-up with PCP.    3.  Hypertension -  Patient's blood pressure during the hospitalization controlled on home regimen of amlodipine  10 mg daily.   - Patient is HCTZ and irbesartan  were held secondary to problem #1 and will be held on discharge until follow-up with PCP.   - Outpatient follow-up with PCP.    4.  Depression/anxiety - Patient maintained on home regimen fluoxetine  40 mg daily, bupropion  XL 300 mg daily.   5.  Hyperlipidemia -  Patient maintained on home regimen statin.   6.  Anemia -Patient with no overt bleeding. - Anemia panel with iron level of 61, TIBC of 329, ferritin of 24, folate of 19.7, vitamin B12 of 787.  - Hemoglobin remained stable and was 9.3 by day of discharge.  -Outpatient follow-up with PCP.     7.  Dementia - Patient maintained on home regimen Zyprexa . - Delirium precautions. -Outpatient follow-up   8.  GERD - Patient maintained on Pepcid  during the hospitalization.     Procedures: Renal ultrasound 07/08/2024  Consultations: Curbside nephrology  Discharge Exam: Vitals:   07/09/24 2230 07/10/24 0621  BP: (!) 137/58 (!) 168/78  Pulse: 67 73  Resp: 18 18  Temp: 98.2 F (36.8 C) 98.6 F (37 C)  SpO2: 100% 96%    General: NAD Cardiovascular: RRR no murmurs rubs or gallops.  No JVD.  No lower extremity edema. Respiratory: Clear to auscultation bilaterally.  No wheezes, no crackles, no rhonchi.  Fair air movement.  Speaking in full sentences.  Discharge Instructions   Discharge Instructions     Diet general   Complete by: As directed    Soft diet.   Increase activity slowly   Complete by: As directed       Allergies as of 07/10/2024   No Known Allergies      Medication List     PAUSE taking these medications    hydrochlorothiazide  12.5 MG capsule Wait to take this until your doctor or other care provider tells you to start again. Commonly known as: MICROZIDE  Take 1 capsule (12.5 mg total) by mouth daily.   irbesartan  300 MG tablet Wait to take this until your doctor or other care provider tells you to start again. Commonly known as: AVAPRO  Take 1 tablet (300 mg total) by mouth daily.       TAKE these medications    acetaminophen  325 MG tablet Commonly known as: TYLENOL  Take 2 tablets (650 mg total) by mouth every 6 (six) hours as needed for mild pain (pain score 1-3) or fever (or Fever >/= 101).   alendronate  70 MG tablet Commonly known as:  FOSAMAX  Take 1 tablet (70 mg total) by mouth once a week. Take with a full glass of water on an empty stomach.   amLODipine  10 MG tablet Commonly known as: NORVASC  Take 1 tablet (10 mg total) by mouth daily.   buPROPion  300 MG 24 hr tablet Commonly known as: WELLBUTRIN  XL Take 1 tablet (300 mg total) by mouth daily.   chlorhexidine  0.12 % solution Commonly known as: PERIDEX  Use as directed 30 mLs in the mouth or throat 3 (three) times daily.   feeding supplement Liqd Take 237 mLs by mouth 2 (two) times daily between meals.   FLUoxetine  40 MG capsule Commonly known as: PROZAC  Take 1 capsule (40 mg total) by mouth daily.   HYDROcodone -acetaminophen  10-325 MG tablet Commonly known as: NORCO Take 1 tablet by mouth every 4 (four) hours as needed for moderate pain (pain score 4-6) or severe pain (pain score 7-10).  metFORMIN  500 MG 24 hr tablet Commonly known as: GLUCOPHAGE -XR Take 1 tablet (500 mg total) by mouth 2 (two) times daily.   OLANZapine  5 MG tablet Commonly known as: ZYPREXA  Take 1 tablet (5 mg total) by mouth daily.   pioglitazone  45 MG tablet Commonly known as: ACTOS  Take 1 tablet (45 mg total) by mouth daily.   rosuvastatin  10 MG tablet Commonly known as: CRESTOR  Take 1 tablet (10 mg total) by mouth daily.       Allergies[1]  Follow-up Information     Norleen Lynwood ORN, MD. Schedule an appointment as soon as possible for a visit in 1 week(s).   Specialties: Internal Medicine, Radiology Contact information: [address] [place] [address] [phone]                  The results of significant diagnostics from this hospitalization (including imaging, microbiology, ancillary and laboratory) are listed below for reference.    Significant Diagnostic Studies: US  RENAL Result Date: 07/08/2024 EXAM: RETROPERITONEAL ULTRASOUND OF THE KIDNEYS 07/08/2024 05:04:28 PM TECHNIQUE: Real-time ultrasonography of the retroperitoneum, specifically the  kidneys and urinary bladder, was performed. COMPARISON: None available. CLINICAL HISTORY: ARF (acute renal failure). FINDINGS: RIGHT KIDNEY: Right kidney measures 8.7 x 4.0 x 4.2 cm. Normal cortical echogenicity. No hydronephrosis. No calculus. No mass. Right ureteral jet is visualized. LEFT KIDNEY: Left kidney measures 9.0 x 5.2 x 3.5 cm. Normal cortical echogenicity. No hydronephrosis. No calculus. No mass. Left ureteral jet is visualized. BLADDER: Unremarkable appearance of the bladder. IMPRESSION: 1. No significant abnormality. Electronically signed by: Greig Pique MD 07/08/2024 05:15 PM EST RP Workstation: [id number]    Microbiology: No results found for this or any previous visit (from the past 240 hours).   Labs: Basic Metabolic Panel: Recent Labs  Lab 07/07/24 1520 07/08/24 1329 07/09/24 0628 07/10/24 0523  NA 140 141 142 144  K 4.2 4.3 3.8 3.9  CL 104 104 109 112*  CO2 27 27 25 23   GLUCOSE 124* 114* 104* 103*  BUN 26* 24* 21 16  CREATININE 1.90* 1.90* 1.45* 1.42*  CALCIUM  9.3 10.1 9.1 8.9   Liver Function Tests: Recent Labs  Lab 07/07/24 1520  AST 28  ALT 21  ALKPHOS 50  BILITOT 0.8  PROT 7.1  ALBUMIN 4.4   No results for input(s): LIPASE, AMYLASE in the last 168 hours. No results for input(s): AMMONIA in the last 168 hours. CBC: Recent Labs  Lab 07/07/24 1520 07/08/24 1329 07/09/24 0628 07/10/24 0523  WBC 4.7 5.4 4.9 5.2  NEUTROABS 3.2  --   --  2.9  HGB 10.0* 9.8* 9.0* 9.3*  HCT 30.2* 30.1* 28.5* 29.6*  MCV 89.8 91.8 92.8 93.4  PLT 189.0 193 162 162   Cardiac Enzymes: No results for input(s): CKTOTAL, CKMB, CKMBINDEX, TROPONINI in the last 168 hours. BNP: BNP (last 3 results) No results for input(s): BNP in the last 8760 hours.  ProBNP (last 3 results) No results for input(s): PROBNP in the last 8760 hours.  CBG: Recent Labs  Lab 07/09/24 0755 07/09/24 1235 07/09/24 1632 07/09/24 2232 07/10/24 0802  GLUCAP 98 82 106*  194* 96       Signed:  Toribio Hummer MD.  [facility] 07/10/2024, 11:42 AM       [1] No Known Allergies

## 2024-07-10 NOTE — Plan of Care (Signed)
" °  Problem: Clinical Measurements: Goal: Will remain free from infection Outcome: Progressing Goal: Diagnostic test results will improve Outcome: Progressing   Problem: Elimination: Goal: Will not experience complications related to bowel motility Outcome: Progressing   Problem: Pain Managment: Goal: General experience of comfort will improve and/or be controlled Outcome: Progressing   Problem: Safety: Goal: Ability to remain free from injury will improve Outcome: Progressing   Problem: Skin Integrity: Goal: Risk for impaired skin integrity will decrease Outcome: Progressing   "

## 2024-07-11 ENCOUNTER — Telehealth: Payer: Self-pay

## 2024-07-11 NOTE — Telephone Encounter (Signed)
 needs to schedule a Hosp f/u appt 1wk from today's discharge. Please call to schedule

## 2024-07-11 NOTE — Telephone Encounter (Signed)
 Visit scheduled.

## 2024-07-12 NOTE — Telephone Encounter (Signed)
 Called and unable to leave voicemail

## 2024-07-19 ENCOUNTER — Encounter: Payer: Self-pay | Admitting: Internal Medicine

## 2024-07-19 ENCOUNTER — Ambulatory Visit: Admitting: Internal Medicine

## 2024-07-19 ENCOUNTER — Inpatient Hospital Stay: Admitting: Internal Medicine

## 2024-07-19 ENCOUNTER — Ambulatory Visit: Payer: Self-pay | Admitting: Internal Medicine

## 2024-07-19 VITALS — BP 148/80 | HR 65 | Temp 97.8°F | Ht 59.0 in | Wt 129.0 lb

## 2024-07-19 DIAGNOSIS — N289 Disorder of kidney and ureter, unspecified: Secondary | ICD-10-CM

## 2024-07-19 DIAGNOSIS — N179 Acute kidney failure, unspecified: Secondary | ICD-10-CM

## 2024-07-19 DIAGNOSIS — I10 Essential (primary) hypertension: Secondary | ICD-10-CM | POA: Diagnosis not present

## 2024-07-19 DIAGNOSIS — R350 Frequency of micturition: Secondary | ICD-10-CM

## 2024-07-19 LAB — BASIC METABOLIC PANEL WITH GFR
BUN: 25 mg/dL — ABNORMAL HIGH (ref 6–23)
CO2: 27 meq/L (ref 19–32)
Calcium: 9.7 mg/dL (ref 8.4–10.5)
Chloride: 103 meq/L (ref 96–112)
Creatinine, Ser: 1.83 mg/dL — ABNORMAL HIGH (ref 0.40–1.20)
GFR: 25.38 mL/min — ABNORMAL LOW
Glucose, Bld: 141 mg/dL — ABNORMAL HIGH (ref 70–99)
Potassium: 4.2 meq/L (ref 3.5–5.1)
Sodium: 138 meq/L (ref 135–145)

## 2024-07-19 LAB — URINALYSIS, ROUTINE W REFLEX MICROSCOPIC
Bilirubin Urine: NEGATIVE
Hgb urine dipstick: NEGATIVE
Ketones, ur: NEGATIVE
Leukocytes,Ua: NEGATIVE
Nitrite: NEGATIVE
Specific Gravity, Urine: 1.01 (ref 1.000–1.030)
Total Protein, Urine: NEGATIVE
Urine Glucose: NEGATIVE
Urobilinogen, UA: 0.2 (ref 0.0–1.0)
pH: 6 (ref 5.0–8.0)

## 2024-07-19 MED ORDER — FLUOXETINE HCL 40 MG PO CAPS: 40.0000 mg | ORAL_CAPSULE | Freq: Every day | ORAL | 3 refills | Status: AC

## 2024-07-19 MED ORDER — BUPROPION HCL ER (XL) 300 MG PO TB24: 300.0000 mg | ORAL_TABLET | Freq: Every day | ORAL | 3 refills | Status: AC

## 2024-07-19 MED ORDER — METFORMIN HCL ER 500 MG PO TB24: 500.0000 mg | ORAL_TABLET | Freq: Two times a day (BID) | ORAL | 0 refills | Status: AC

## 2024-07-19 MED ORDER — ROSUVASTATIN CALCIUM 10 MG PO TABS: 10.0000 mg | ORAL_TABLET | Freq: Every day | ORAL | 3 refills | Status: AC

## 2024-07-19 MED ORDER — AMLODIPINE BESYLATE 10 MG PO TABS: 10.0000 mg | ORAL_TABLET | Freq: Every day | ORAL | 3 refills | Status: AC

## 2024-07-19 MED ORDER — OLANZAPINE 5 MG PO TABS: 5.0000 mg | ORAL_TABLET | Freq: Every day | ORAL | 1 refills | Status: AC

## 2024-07-19 MED ORDER — IRBESARTAN 150 MG PO TABS: 150.0000 mg | ORAL_TABLET | Freq: Every day | ORAL | 3 refills | Status: AC

## 2024-07-19 MED ORDER — PIOGLITAZONE HCL 45 MG PO TABS: 45.0000 mg | ORAL_TABLET | Freq: Every day | ORAL | 3 refills | Status: AC

## 2024-07-19 MED ORDER — ALENDRONATE SODIUM 70 MG PO TABS: 70.0000 mg | ORAL_TABLET | ORAL | 3 refills | Status: AC

## 2024-07-19 NOTE — Assessment & Plan Note (Signed)
 BP Readings from Last 3 Encounters:  07/19/24 (!) 148/80  07/10/24 (!) 168/78  07/07/24 122/70   Uncontrolled, pt for restart avapro  at lower dose 150 mg qd

## 2024-07-19 NOTE — Patient Instructions (Signed)
 Ok to restart the irbesartan  at 150 mg per day  Please continue all other medications as before, and refills have been done if requested.  Please have the pharmacy call with any other refills you may need.  Please keep your appointments with your specialists as you may have planned  Please go to the LAB at the blood drawing area for the tests to be done - the urine and blood test  You will be contacted by phone if any changes need to be made immediately.  Otherwise, you will receive a letter about your results with an explanation, but please check with MyChart first.  Please make an Appointment to return in 6 months, or sooner if needed

## 2024-07-19 NOTE — Assessment & Plan Note (Signed)
 Exam benign, but can't r/o uti - for ua and cx

## 2024-07-19 NOTE — Progress Notes (Signed)
 Patient ID: Latasha Wang, female   DOB: [birth year], 84 y.o.   MRN: [id number]        Chief Complaint: follow up post hospn jan 17 - 19 2026 with ARF - with interpretor today       HPI:  Latasha Wang is a 84 y.o. female here with above, with ARB and HCT held at discharge.  Overall doing well with more stamina after IVFs.  Pt denies chest pain, increased sob or doe, wheezing, orthopnea, PND, increased LE swelling, palpitations, dizziness or syncope.   Pt denies polydipsia, polyuria, or new focal neuro s/s.          Wt Readings from Last 3 Encounters:  07/19/24 129 lb (58.5 kg)  07/10/24 132 lb 11.5 oz (60.2 kg)  07/07/24 129 lb (58.5 kg)   BP Readings from Last 3 Encounters:  07/19/24 (!) 148/80  07/10/24 (!) 168/78  07/07/24 122/70         Past Medical History:  Diagnosis Date   ANXIETY    Dementia (HCC) 07/24/2020   DEPRESSION    Diabetes mellitus    Essential hypertension 05/09/2007   Qualifier: Diagnosis of  By: Georgian ROSALEA CHARM Lamar    FATTY LIVER DISEASE    Gastroparesis    GERD    HYPERLIPIDEMIA    HYPERTENSION    LATERAL EPICONDYLITIS, RIGHT    Past Surgical History:  Procedure Laterality Date   CATARACT EXTRACTION, BILATERAL     TOTAL ELBOW ARTHROPLASTY  02/19/2012   Procedure: TOTAL ELBOW ARTHROPLASTY;  Surgeon: Ozell VEAR Bruch, MD;  Location: [facility] OR;  Service: Orthopedics;  Laterality: Left;    reports that she has never smoked. She has never used smokeless tobacco. She reports that she does not drink alcohol and does not use drugs. family history is not on file. Allergies[1] Medications Ordered Prior to Encounter[2]      ROS:  All others reviewed and negative.  Objective        PE:  BP (!) 148/80 (BP Location: Right Arm, Patient Position: Sitting, Cuff Size: Normal)   Pulse 65   Temp 97.8 F (36.6 C) (Oral)   Ht 4' 11 (1.499 m)   Wt 129 lb (58.5 kg)   SpO2 98%   BMI 26.05 kg/m                 Constitutional: Pt appears in NAD               HENT: Head:  NCAT.                Right Ear: External ear normal.                 Left Ear: External ear normal.                Eyes: . Pupils are equal, round, and reactive to light. Conjunctivae and EOM are normal               Nose: without d/c or deformity               Neck: Neck supple. Gross normal ROM               Cardiovascular: Normal rate and regular rhythm.                 Pulmonary/Chest: Effort normal and breath sounds without rales or wheezing.  Abd:  Soft, NT, ND, + BS, no organomegaly               Neurological: Pt is alert. At baseline orientation, motor grossly intact               Skin: Skin is warm. No rashes, no other new lesions, LE edema - none               Psychiatric: Pt behavior is normal without agitation   Micro: none  Cardiac tracings I have personally interpreted today:  none  Pertinent Radiological findings (summarize): none   Lab Results  Component Value Date   WBC 5.2 07/10/2024   HGB 9.3 (L) 07/10/2024   HCT 29.6 (L) 07/10/2024   PLT 162 07/10/2024   GLUCOSE 141 (H) 07/19/2024   CHOL 123 07/07/2024   TRIG 59.0 07/07/2024   HDL 79.60 07/07/2024   LDLDIRECT 85.1 08/19/2006   LDLCALC 32 07/07/2024   ALT 21 07/07/2024   AST 28 07/07/2024   NA 138 07/19/2024   K 4.2 07/19/2024   CL 103 07/19/2024   CREATININE 1.83 (H) 07/19/2024   BUN 25 (H) 07/19/2024   CO2 27 07/19/2024   TSH 2.86 07/07/2024   INR 0.91 02/19/2012   HGBA1C 6.3 07/07/2024   MICROALBUR 2.1 (H) 07/07/2024   Assessment/Plan:  Latasha Wang is a 84 y.o. Asian [4] female with  has a past medical history of ANXIETY, Dementia (HCC) (07/24/2020), DEPRESSION, Diabetes mellitus, Essential hypertension (05/09/2007), FATTY LIVER DISEASE, Gastroparesis, GERD, HYPERLIPIDEMIA, HYPERTENSION, and LATERAL EPICONDYLITIS, RIGHT.  ARF (acute renal failure) Likely improved, for bmp today,  to f/u any worsening symptoms or concerns   Essential hypertension BP Readings from Last 3  Encounters:  07/19/24 (!) 148/80  07/10/24 (!) 168/78  07/07/24 122/70   Uncontrolled, pt for restart avapro  at lower dose 150 mg qd   Urinary frequency Exam benign, but can't r/o uti - for ua and cx  Followup: Return in about 6 months (around 01/16/2025).  Lynwood Rush, MD 07/19/2024 6:54 PM [facility] Internal Medicine     [1] No Known Allergies [2]  Current Outpatient Medications on File Prior to Visit  Medication Sig Dispense Refill   acetaminophen  (TYLENOL ) 325 MG tablet Take 2 tablets (650 mg total) by mouth every 6 (six) hours as needed for mild pain (pain score 1-3) or fever (or Fever >/= 101).     chlorhexidine  (PERIDEX ) 0.12 % solution Use as directed 30 mLs in the mouth or throat 3 (three) times daily.     feeding supplement (ENSURE PLUS HIGH PROTEIN) LIQD Take 237 mLs by mouth 2 (two) times daily between meals.     HYDROcodone -acetaminophen  (NORCO) 10-325 MG tablet Take 1 tablet by mouth every 4 (four) hours as needed for moderate pain (pain score 4-6) or severe pain (pain score 7-10).     No current facility-administered medications on file prior to visit.

## 2024-07-19 NOTE — Assessment & Plan Note (Signed)
 Likely improved, for bmp today,  to f/u any worsening symptoms or concerns

## 2024-07-20 LAB — URINE CULTURE: Result:: NO GROWTH
